# Patient Record
Sex: Female | Born: 1970
Health system: Southern US, Community
[De-identification: ages and names within clinical notes are randomized; demographics above are authoritative.]

## PROBLEM LIST (undated history)

## (undated) DIAGNOSIS — G43909 Migraine, unspecified, not intractable, without status migrainosus: Secondary | ICD-10-CM

## (undated) DIAGNOSIS — J020 Streptococcal pharyngitis: Secondary | ICD-10-CM

## (undated) DIAGNOSIS — C801 Malignant (primary) neoplasm, unspecified: Secondary | ICD-10-CM

## (undated) DIAGNOSIS — I2699 Other pulmonary embolism without acute cor pulmonale: Secondary | ICD-10-CM

## (undated) DIAGNOSIS — K219 Gastro-esophageal reflux disease without esophagitis: Secondary | ICD-10-CM

## (undated) DIAGNOSIS — F419 Anxiety disorder, unspecified: Secondary | ICD-10-CM

## (undated) DIAGNOSIS — T7840XA Allergy, unspecified, initial encounter: Secondary | ICD-10-CM

## (undated) DIAGNOSIS — J189 Pneumonia, unspecified organism: Secondary | ICD-10-CM

## (undated) HISTORY — PX: SIMPLE MASTECTOMY: SHX2409

## (undated) HISTORY — DX: Anxiety disorder, unspecified: F41.9

## (undated) HISTORY — DX: Gastro-esophageal reflux disease without esophagitis: K21.9

## (undated) HISTORY — PX: WISDOM TOOTH EXTRACTION: SHX21

## (undated) HISTORY — PX: RECONSTRUCTION BREAST IMMEDIATE / DELAYED W/ TISSUE EXPANDER: SUR1077

## (undated) HISTORY — DX: Allergy, unspecified, initial encounter: T78.40XA

## (undated) HISTORY — DX: Migraine, unspecified, not intractable, without status migrainosus: G43.909

---

## 1998-03-26 ENCOUNTER — Encounter: Admission: RE | Admit: 1998-03-26 | Discharge: 1998-03-26 | Payer: Self-pay | Admitting: Family Medicine

## 1998-04-03 ENCOUNTER — Encounter: Admission: RE | Admit: 1998-04-03 | Discharge: 1998-04-03 | Payer: Self-pay | Admitting: Family Medicine

## 1998-04-19 ENCOUNTER — Encounter: Admission: RE | Admit: 1998-04-19 | Discharge: 1998-04-19 | Payer: Self-pay | Admitting: Family Medicine

## 1998-04-23 ENCOUNTER — Encounter: Admission: RE | Admit: 1998-04-23 | Discharge: 1998-04-23 | Payer: Self-pay | Admitting: Sports Medicine

## 1998-04-30 ENCOUNTER — Encounter: Admission: RE | Admit: 1998-04-30 | Discharge: 1998-04-30 | Payer: Self-pay | Admitting: Family Medicine

## 1998-06-05 ENCOUNTER — Encounter: Admission: RE | Admit: 1998-06-05 | Discharge: 1998-06-05 | Payer: Self-pay | Admitting: Family Medicine

## 1998-09-10 ENCOUNTER — Encounter: Admission: RE | Admit: 1998-09-10 | Discharge: 1998-09-10 | Payer: Self-pay | Admitting: Family Medicine

## 2000-01-12 ENCOUNTER — Other Ambulatory Visit: Admission: RE | Admit: 2000-01-12 | Discharge: 2000-01-12 | Payer: Self-pay | Admitting: Family Medicine

## 2001-12-28 ENCOUNTER — Other Ambulatory Visit: Admission: RE | Admit: 2001-12-28 | Discharge: 2001-12-28 | Payer: Self-pay | Admitting: Family Medicine

## 2003-05-28 ENCOUNTER — Other Ambulatory Visit: Admission: RE | Admit: 2003-05-28 | Discharge: 2003-05-28 | Payer: Self-pay | Admitting: Family Medicine

## 2005-04-13 ENCOUNTER — Other Ambulatory Visit: Admission: RE | Admit: 2005-04-13 | Discharge: 2005-04-13 | Payer: Self-pay | Admitting: Family Medicine

## 2006-06-16 ENCOUNTER — Other Ambulatory Visit: Admission: RE | Admit: 2006-06-16 | Discharge: 2006-06-16 | Payer: Self-pay | Admitting: Family Medicine

## 2006-08-04 ENCOUNTER — Encounter: Admission: RE | Admit: 2006-08-04 | Discharge: 2006-08-04 | Payer: Self-pay | Admitting: Otolaryngology

## 2008-05-29 ENCOUNTER — Other Ambulatory Visit: Admission: RE | Admit: 2008-05-29 | Discharge: 2008-05-29 | Payer: Self-pay | Admitting: Family Medicine

## 2012-07-14 ENCOUNTER — Other Ambulatory Visit (HOSPITAL_COMMUNITY)
Admission: RE | Admit: 2012-07-14 | Discharge: 2012-07-14 | Disposition: A | Discharge status: Home / Self Care | Payer: BC Managed Care – PPO | Source: Ambulatory Visit | Attending: Family Medicine | Admitting: Family Medicine

## 2012-07-14 ENCOUNTER — Other Ambulatory Visit: Payer: Self-pay | Admitting: Family Medicine

## 2012-07-14 DIAGNOSIS — Z0000 Encounter for general adult medical examination without abnormal findings: Secondary | ICD-10-CM | POA: Insufficient documentation

## 2012-07-15 ENCOUNTER — Telehealth: Payer: Self-pay | Admitting: Genetic Counselor

## 2012-07-15 NOTE — Telephone Encounter (Signed)
S/W PT IN REF TO NP GENETIC  APPT. 09/22/12@10 :00 REFERRING DR WHITE MAILED NP PACKET

## 2012-07-26 ENCOUNTER — Other Ambulatory Visit: Payer: Self-pay | Admitting: Family Medicine

## 2012-07-26 DIAGNOSIS — M25512 Pain in left shoulder: Secondary | ICD-10-CM

## 2012-07-29 ENCOUNTER — Ambulatory Visit
Admission: RE | Admit: 2012-07-29 | Discharge: 2012-07-29 | Disposition: A | Discharge status: Home / Self Care | Payer: BC Managed Care – PPO | Source: Ambulatory Visit | Attending: Family Medicine | Admitting: Family Medicine

## 2012-07-29 DIAGNOSIS — M25512 Pain in left shoulder: Secondary | ICD-10-CM

## 2012-09-22 ENCOUNTER — Encounter: Payer: Self-pay | Admitting: Genetic Counselor

## 2012-09-22 ENCOUNTER — Other Ambulatory Visit: Payer: Self-pay | Admitting: Lab

## 2013-07-20 ENCOUNTER — Telehealth: Payer: Self-pay | Admitting: Genetic Counselor

## 2013-07-20 NOTE — Telephone Encounter (Signed)
LEFT MESSAGE FOP PATIENT TO RETURN CALL TO SCHEDULE GENETIC APPT.

## 2013-07-25 ENCOUNTER — Telehealth: Payer: Self-pay | Admitting: Genetic Counselor

## 2013-07-25 NOTE — Telephone Encounter (Signed)
S/W PATIENT AND GAVE GENETIC APPT FOR 05/28 @ 1 W/GENETIC COUSELOR.  [place] [facility] PACK[facility] MAILED.

## 2013-09-01 ENCOUNTER — Telehealth: Payer: Self-pay | Admitting: *Deleted

## 2013-09-01 NOTE — Telephone Encounter (Signed)
Left message for pt to return my call so I can reschedule her genetic appt. 

## 2013-09-08 ENCOUNTER — Telehealth: Payer: Self-pay | Admitting: *Deleted

## 2013-09-08 NOTE — Telephone Encounter (Signed)
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

## 2013-09-28 ENCOUNTER — Encounter: Payer: BC Managed Care – PPO | Admitting: Genetic Counselor

## 2013-09-28 ENCOUNTER — Other Ambulatory Visit: Payer: BC Managed Care – PPO

## 2014-11-06 ENCOUNTER — Encounter: Payer: Self-pay | Admitting: Family

## 2014-11-06 ENCOUNTER — Encounter (INDEPENDENT_AMBULATORY_CARE_PROVIDER_SITE_OTHER): Payer: Self-pay

## 2014-11-06 ENCOUNTER — Ambulatory Visit (INDEPENDENT_AMBULATORY_CARE_PROVIDER_SITE_OTHER): Payer: 59 | Admitting: Family

## 2014-11-06 ENCOUNTER — Other Ambulatory Visit (INDEPENDENT_AMBULATORY_CARE_PROVIDER_SITE_OTHER): Payer: 59

## 2014-11-06 ENCOUNTER — Telehealth: Payer: Self-pay | Admitting: Family

## 2014-11-06 VITALS — BP 102/78 | HR 65 | Temp 98.0°F | Resp 18 | Ht 63.0 in | Wt 145.0 lb

## 2014-11-06 DIAGNOSIS — M542 Cervicalgia: Secondary | ICD-10-CM

## 2014-11-06 DIAGNOSIS — R5383 Other fatigue: Secondary | ICD-10-CM | POA: Insufficient documentation

## 2014-11-06 LAB — CBC
HCT: 41.3 % (ref 36.0–46.0)
Hemoglobin: 13.5 g/dL (ref 12.0–15.0)
MCHC: 32.7 g/dL (ref 30.0–36.0)
MCV: 88.3 fl (ref 78.0–100.0)
PLATELETS: 296 10*3/uL (ref 150.0–400.0)
RBC: 4.68 Mil/uL (ref 3.87–5.11)
RDW: 12.8 % (ref 11.5–15.5)
WBC: 5.7 10*3/uL (ref 4.0–10.5)

## 2014-11-06 LAB — TSH: TSH: 1.64 u[IU]/mL (ref 0.35–4.50)

## 2014-11-06 LAB — IBC PANEL
Iron: 95 ug/dL (ref 42–145)
SATURATION RATIOS: 30.3 % (ref 20.0–50.0)
Transferrin: 224 mg/dL (ref 212.0–360.0)

## 2014-11-06 LAB — B12 AND FOLATE PANEL
FOLATE: 22.3 ng/mL (ref >5.9)
VITAMIN B 12: 742 pg/mL (ref 211–911)

## 2014-11-06 NOTE — Progress Notes (Signed)
Pre visit review using our clinic review tool, if applicable. No additional management support is needed unless otherwise documented below in the visit note. 

## 2014-11-06 NOTE — Assessment & Plan Note (Signed)
Neck exam is mostly benign. Treat conservatively at this time with current regimen of Zanaflex and naproxen as needed. Increase treatment with heat and stretching multiple times per day. Follow-up if symptoms worsen or fail to improve.

## 2014-11-06 NOTE — Assessment & Plan Note (Signed)
Fatigue of undetermined source. Obtain B12 and folate, CBC, IVC panel, TSH, and Lyme disease to rule out metabolic causes. Cannot rule out underlying cardiovascular disease, anxiety, or depression. Follow up pending lab work. Continue current medications.

## 2014-11-06 NOTE — Telephone Encounter (Signed)
Please inform the patient that her lab work from today indicates no metabolic cause of her fatigue as her B12/Folate, iron, thyroid function, and white/red blood cells are normal. We are still awaiting her lyme disease test. As we discussed, we cannot rule out cardiovascular disease, depression or her anxiety as the causes. As for her neck we can plan to follow up in about a month to determine any improvements, and if not consider further imaging or referral if needed.

## 2014-11-06 NOTE — Patient Instructions (Addendum)
Thank you for choosing [facility].  Summary/Instructions:  For your neck, please continue to take the naproxen and zanaflex as needed. Also add heat and stretching. May also consider icy/hot or related products.   Continue to take your medications as prescribed.   If your symptoms worsen or fail to improve, please contact our office for further instruction, or in case of emergency go directly to the emergency room at the closest medical facility.    Cervical  Exercises with Rehab Cervical strain and sprain are injuries that commonly occur with "whiplash" injuries. Whiplash occurs when the neck is forcefully whipped backward or forward, such as during a motor vehicle accident or during contact sports. The muscles, ligaments, tendons, discs, and nerves of the neck are susceptible to injury when this occurs. RISK FACTORS Risk of having a whiplash injury increases if:  Osteoarthritis of the spine.  Situations that make head or neck accidents or trauma more likely.  High-risk sports (football, rugby, wrestling, hockey, auto racing, gymnastics, diving, contact karate, or boxing).  Poor strength and flexibility of the neck.  Previous neck injury.  Poor tackling technique.  Improperly fitted or padded equipment. SYMPTOMS   Pain or stiffness in the front or back of neck or both.  Symptoms may present immediately or up to 24 hours after injury.  Dizziness, headache, nausea, and vomiting.  Muscle spasm with soreness and stiffness in the neck.  Tenderness and swelling at the injury site. PREVENTION  Learn and use proper technique (avoid tackling with the head, spearing, and head-butting; use proper falling techniques to avoid landing on the head).  Warm up and stretch properly before activity.  Maintain physical fitness:  Strength, flexibility, and endurance.  Cardiovascular fitness.  Wear properly fitted and padded protective equipment, such as padded soft collars,  for participation in contact sports. PROGNOSIS  Recovery from cervical strain and sprain injuries is dependent on the extent of the injury. These injuries are usually curable in 1 week to 3 months with appropriate treatment.  RELATED COMPLICATIONS   Temporary numbness and weakness may occur if the nerve roots are damaged, and this may persist until the nerve has completely healed.  Chronic pain due to frequent recurrence of symptoms.  Prolonged healing, especially if activity is resumed too soon (before complete recovery). TREATMENT  Treatment initially involves the use of ice and medication to help reduce pain and inflammation. It is also important to perform strengthening and stretching exercises and modify activities that worsen symptoms so the injury does not get worse. These exercises may be performed at home or with a therapist. For patients who experience severe symptoms, a soft, padded collar may be recommended to be worn around the neck.  Improving your posture may help reduce symptoms. Posture improvement includes pulling your chin and abdomen in while sitting or standing. If you are sitting, sit in a firm chair with your buttocks against the back of the chair. While sleeping, try replacing your pillow with a small towel rolled to 2 inches in diameter, or use a cervical pillow or soft cervical collar. Poor sleeping positions delay healing.  For patients with nerve root damage, which causes numbness or weakness, the use of a cervical traction apparatus may be recommended. Surgery is rarely necessary for these injuries. However, cervical strain and sprains that are present at birth (congenital) may require surgery. MEDICATION   If pain medication is necessary, nonsteroidal anti-inflammatory medications, such as aspirin and ibuprofen, or other minor pain relievers, such as acetaminophen,  are often recommended.  Do not take pain medication for 7 days before surgery.  Prescription pain  relievers may be given if deemed necessary by your caregiver. Use only as directed and only as much as you need. HEAT AND COLD:   Cold treatment (icing) relieves pain and reduces inflammation. Cold treatment should be applied for 10 to 15 minutes every 2 to 3 hours for inflammation and pain and immediately after any activity that aggravates your symptoms. Use ice packs or an ice massage.  Heat treatment may be used prior to performing the stretching and strengthening activities prescribed by your caregiver, physical therapist, or athletic trainer. Use a heat pack or a warm soak. SEEK MEDICAL CARE IF:   Symptoms get worse or do not improve in 2 weeks despite treatment.  New, unexplained symptoms develop (drugs used in treatment may produce side effects). EXERCISES RANGE OF MOTION (ROM) AND STRETCHING EXERCISES - Cervical Strain and Sprain These exercises may help you when beginning to rehabilitate your injury. In order to successfully resolve your symptoms, you must improve your posture. These exercises are designed to help reduce the forward-head and rounded-shoulder posture which contributes to this condition. Your symptoms may resolve with or without further involvement from your physician, physical therapist or athletic trainer. While completing these exercises, remember:   Restoring tissue flexibility helps normal motion to return to the joints. This allows healthier, less painful movement and activity.  An effective stretch should be held for at least 20 seconds, although you may need to begin with shorter hold times for comfort.  A stretch should never be painful. You should only feel a gentle lengthening or release in the stretched tissue. STRETCH- Axial Extensors  Lie on your back on the floor. You may bend your knees for comfort. Place a rolled-up hand towel or dish towel, about 2 inches in diameter, under the part of your head that makes contact with the floor.  Gently tuck your  chin, as if trying to make a "double chin," until you feel a gentle stretch at the base of your head.  Hold __________ seconds. Repeat __________ times. Complete this exercise __________ times per day.  STRETCH - Axial Extension   Stand or sit on a firm surface. Assume a good posture: chest up, shoulders drawn back, abdominal muscles slightly tense, knees unlocked (if standing) and feet hip width apart.  Slowly retract your chin so your head slides back and your chin slightly lowers. Continue to look straight ahead.  You should feel a gentle stretch in the back of your head. Be certain not to feel an aggressive stretch since this can cause headaches later.  Hold for __________ seconds. Repeat __________ times. Complete this exercise __________ times per day. STRETCH - Cervical Side Bend   Stand or sit on a firm surface. Assume a good posture: chest up, shoulders drawn back, abdominal muscles slightly tense, knees unlocked (if standing) and feet hip width apart.  Without letting your nose or shoulders move, slowly tip your right / left ear to your shoulder until your feel a gentle stretch in the muscles on the opposite side of your neck.  Hold __________ seconds. Repeat __________ times. Complete this exercise __________ times per day. STRETCH - Cervical Rotators   Stand or sit on a firm surface. Assume a good posture: chest up, shoulders drawn back, abdominal muscles slightly tense, knees unlocked (if standing) and feet hip width apart.  Keeping your eyes level with the ground, slowly turn your  head until you feel a gentle stretch along the back and opposite side of your neck.  Hold __________ seconds. Repeat __________ times. Complete this exercise __________ times per day. RANGE OF MOTION - Neck Circles   Stand or sit on a firm surface. Assume a good posture: chest up, shoulders drawn back, abdominal muscles slightly tense, knees unlocked (if standing) and feet hip width  apart.  Gently roll your head down and around from the back of one shoulder to the back of the other. The motion should never be forced or painful.  Repeat the motion 10-20 times, or until you feel the neck muscles relax and loosen. Repeat __________ times. Complete the exercise __________ times per day. STRENGTHENING EXERCISES - Cervical Strain and Sprain These exercises may help you when beginning to rehabilitate your injury. They may resolve your symptoms with or without further involvement from your physician, physical therapist, or athletic trainer. While completing these exercises, remember:   Muscles can gain both the endurance and the strength needed for everyday activities through controlled exercises.  Complete these exercises as instructed by your physician, physical therapist, or athletic trainer. Progress the resistance and repetitions only as guided.  You may experience muscle soreness or fatigue, but the pain or discomfort you are trying to eliminate should never worsen during these exercises. If this pain does worsen, stop and make certain you are following the directions exactly. If the pain is still present after adjustments, discontinue the exercise until you can discuss the trouble with your clinician. STRENGTH - Cervical Flexors, Isometric  Face a wall, standing about 6 inches away. Place a small pillow, a ball about 6-8 inches in diameter, or a folded towel between your forehead and the wall.  Slightly tuck your chin and gently push your forehead into the soft object. Push only with mild to moderate intensity, building up tension gradually. Keep your jaw and forehead relaxed.  Hold 10 to 20 seconds. Keep your breathing relaxed.  Release the tension slowly. Relax your neck muscles completely before you start the next repetition. Repeat __________ times. Complete this exercise __________ times per day. STRENGTH- Cervical Lateral Flexors, Isometric   Stand about 6 inches  away from a wall. Place a small pillow, a ball about 6-8 inches in diameter, or a folded towel between the side of your head and the wall.  Slightly tuck your chin and gently tilt your head into the soft object. Push only with mild to moderate intensity, building up tension gradually. Keep your jaw and forehead relaxed.  Hold 10 to 20 seconds. Keep your breathing relaxed.  Release the tension slowly. Relax your neck muscles completely before you start the next repetition. Repeat __________ times. Complete this exercise __________ times per day. STRENGTH - Cervical Extensors, Isometric   Stand about 6 inches away from a wall. Place a small pillow, a ball about 6-8 inches in diameter, or a folded towel between the back of your head and the wall.  Slightly tuck your chin and gently tilt your head back into the soft object. Push only with mild to moderate intensity, building up tension gradually. Keep your jaw and forehead relaxed.  Hold 10 to 20 seconds. Keep your breathing relaxed.  Release the tension slowly. Relax your neck muscles completely before you start the next repetition. Repeat __________ times. Complete this exercise __________ times per day. POSTURE AND BODY MECHANICS CONSIDERATIONS - Cervical Strain and Sprain Keeping correct posture when sitting, standing or completing your activities will reduce  the stress put on different body tissues, allowing injured tissues a chance to heal and limiting painful experiences. The following are general guidelines for improved posture. Your physician or physical therapist will provide you with any instructions specific to your needs. While reading these guidelines, remember:  The exercises prescribed by your provider will help you have the flexibility and strength to maintain correct postures.  The correct posture provides the optimal environment for your joints to work. All of your joints have less wear and tear when properly supported by a  spine with good posture. This means you will experience a healthier, less painful body.  Correct posture must be practiced with all of your activities, especially prolonged sitting and standing. Correct posture is as important when doing repetitive low-stress activities (typing) as it is when doing a single heavy-load activity (lifting). PROLONGED STANDING WHILE SLIGHTLY LEANING FORWARD When completing a task that requires you to lean forward while standing in one place for a long time, place either foot up on a stationary 2- to 4-inch high object to help maintain the best posture. When both feet are on the ground, the low back tends to lose its slight inward curve. If this curve flattens (or becomes too large), then the back and your other joints will experience too much stress, fatigue more quickly, and can cause pain.  RESTING POSITIONS Consider which positions are most painful for you when choosing a resting position. If you have pain with flexion-based activities (sitting, bending, stooping, squatting), choose a position that allows you to rest in a less flexed posture. You would want to avoid curling into a fetal position on your side. If your pain worsens with extension-based activities (prolonged standing, working overhead), avoid resting in an extended position such as sleeping on your stomach. Most people will find more comfort when they rest with their spine in a more neutral position, neither too rounded nor too arched. Lying on a non-sagging bed on your side with a pillow between your knees, or on your back with a pillow under your knees will often provide some relief. Keep in mind, being in any one position for a prolonged period of time, no matter how correct your posture, can still lead to stiffness. WALKING Walk with an upright posture. Your ears, shoulders, and hips should all line up. OFFICE WORK When working at a desk, create an environment that supports good, upright posture. Without  extra support, muscles fatigue and lead to excessive strain on joints and other tissues. CHAIR:  A chair should be able to slide under your desk when your back makes contact with the back of the chair. This allows you to work closely.  The chair's height should allow your eyes to be level with the upper part of your monitor and your hands to be slightly lower than your elbows.  Body position:  Your feet should make contact with the floor. If this is not possible, use a foot rest.  Keep your ears over your shoulders. This will reduce stress on your neck and low back. Document Released: 04/20/2005 Document Revised: 09/04/2013 Document Reviewed: 08/02/2008 [facility] Patient Information 2015 [place], [place]. This information is not intended to replace advice given to you by your health care provider. Make sure you discuss any questions you have with your health care provider.

## 2014-11-06 NOTE — Progress Notes (Signed)
Subjective:    Patient ID: Elizabeth Henry, female    DOB: [birth year], 44 y.o.   MRN: [id number]  Chief Complaint  Patient presents with  . Establish Care    having alot of bone pain, when waking up in the morning hip and shoulder pain but now is moving in neck, x3 years, lyme disease test    HPI:  BECKEY Henry is a 44 y.o. female with a PMH anxiety, GERD of  who presents today for an office visit to establish care.   1.) Achiness - Associated symptom of pain has been going on for about 3 years and is primarily located in her cervical spine. Negative for numbness or tingling. Timing of the pain is worse in the morning and late at night. Current modifying factors include zanaflex and naproxen which do help to control the pain. Severity of the pain is rated at a 5-6/10. Has also done needling and physical therapy.   2.) Fatigue - Associated symptom of fatigue has been going on for about several years. Notes it is exacerbated by her anxiety. She sleeps an average of 8-10 hours of sleep per night. Describes waking up several times throughout the night.   Allergies  Allergen Reactions  . Ambien [Zolpidem Tartrate] Swelling  . Tylox [Oxycodone-Acetaminophen] Hives    No outpatient prescriptions prior to visit.   No facility-administered medications prior to visit.     Past Medical History  Diagnosis Date  . Anxiety   . Allergy   . Migraines   . GERD (gastroesophageal reflux disease)      History reviewed. No pertinent past surgical history.   Family History  Problem Relation Age of Onset  . Breast cancer Mother   . Healthy Father      History   Social History  . Marital Status: Married    Spouse Name: N/A  . Number of Children: 2  . Years of Education: 16   Occupational History  . Self-employed    Social History Main Topics  . Smoking status: Former Research scientist (life sciences)  . Smokeless tobacco: Never Used  . Alcohol Use: No  . Drug Use: No  . Sexual Activity: Not  on file   Other Topics Concern  . Not on file   Social History Narrative   Fun: Go to [employer]; crochet   Denies religious beliefs effecting health care.     Review of Systems  Constitutional: Positive for fatigue. Negative for fever and chills.  Musculoskeletal: Positive for neck stiffness.  Psychiatric/Behavioral: The patient is nervous/anxious.       Objective:    BP 102/78 mmHg  Pulse 65  Temp(Src) 98 F (36.7 C) (Oral)  Resp 18  Ht 5\' 3"  (1.6 m)  Wt 145 lb (65.772 kg)  BMI 25.69 kg/m2  SpO2 97% Nursing note and vital signs reviewed.  Physical Exam  Constitutional: She is oriented to person, place, and time. She appears well-developed and well-nourished. No distress.  Cardiovascular: Normal rate, regular rhythm, normal heart sounds and intact distal pulses.   Pulmonary/Chest: Effort normal and breath sounds normal.  Musculoskeletal:  No obvious deformity, discoloration or edema of the cervical spine noted. Patient's range of motion actively and passively. Negative compression test.  Neurological: She is alert and oriented to person, place, and time.  Skin: Skin is warm and dry.  Psychiatric: She has a normal mood and affect. Her behavior is normal. Judgment and thought content normal.       Assessment &  Plan:   Problem List Items Addressed This Visit      Other   Neck pain - Primary    Neck exam is mostly benign. Treat conservatively at this time with current regimen of Zanaflex and naproxen as needed. Increase treatment with heat and stretching multiple times per day. Follow-up if symptoms worsen or fail to improve.      Relevant Medications   naproxen (NAPROSYN) 500 MG tablet   tiZANidine (ZANAFLEX) 4 MG capsule   Fatigue    Fatigue of undetermined source. Obtain B12 and folate, CBC, IVC panel, TSH, and Lyme disease to rule out metabolic causes. Cannot rule out underlying cardiovascular disease, anxiety, or depression. Follow up pending lab work.  Continue current medications.       Relevant Orders   B12 and Folate Panel   CBC   IBC panel   TSH   [facility] Antibodies

## 2014-11-07 LAB — B. BURGDORFI ANTIBODIES: B BURGDORFERI AB IGG+ IGM: 0.39 {ISR}

## 2014-11-07 NOTE — Telephone Encounter (Signed)
Please inform patient that her lyme disease test was also negative.

## 2014-11-07 NOTE — Telephone Encounter (Signed)
Pt wants you to be aware that she takes melatonin, hydroxyzine, lorazepam, and zanaflex all to help her sleep at night. She says her brain doesn't shut off and she thinks its due to anxiety. Muscle relaxer is for the pain that she has in her joints at night. She felt that it was important for you know and she forgot to mention it yesterday.

## 2014-11-13 ENCOUNTER — Telehealth: Payer: Self-pay | Admitting: Family

## 2014-11-13 MED ORDER — LORAZEPAM 1 MG PO TABS: 1.0000 mg | ORAL_TABLET | Freq: Every day | ORAL | Status: DC

## 2014-11-13 NOTE — Telephone Encounter (Signed)
Prescription printed and faxed.  

## 2014-11-13 NOTE — Telephone Encounter (Signed)
Is requesting script for lorazepam to be sent to [facility] on [address].

## 2014-11-14 NOTE — Telephone Encounter (Signed)
Patient states lorazepam script was incorrect.  Patient states she take 2 tablets a night and that script was for 1 tablet at night.

## 2014-11-15 MED ORDER — LORAZEPAM 1 MG PO TABS: 2.0000 mg | ORAL_TABLET | Freq: Every day | ORAL | Status: DC

## 2014-11-15 NOTE — Telephone Encounter (Signed)
LVM letting pt know.  

## 2014-11-15 NOTE — Telephone Encounter (Signed)
Medication was printed and faxed.

## 2015-01-16 ENCOUNTER — Other Ambulatory Visit: Payer: 59

## 2015-01-16 ENCOUNTER — Other Ambulatory Visit: Payer: Self-pay | Admitting: Family Medicine

## 2015-01-16 DIAGNOSIS — R140 Abdominal distension (gaseous): Secondary | ICD-10-CM

## 2015-01-24 ENCOUNTER — Encounter: Payer: Self-pay | Admitting: Family Medicine

## 2015-01-24 ENCOUNTER — Ambulatory Visit (INDEPENDENT_AMBULATORY_CARE_PROVIDER_SITE_OTHER): Payer: 59 | Admitting: Family Medicine

## 2015-01-24 VITALS — BP 118/80 | HR 79 | Resp 18 | Ht 63.0 in | Wt 156.0 lb

## 2015-01-24 DIAGNOSIS — F419 Anxiety disorder, unspecified: Secondary | ICD-10-CM | POA: Insufficient documentation

## 2015-01-24 DIAGNOSIS — N939 Abnormal uterine and vaginal bleeding, unspecified: Secondary | ICD-10-CM

## 2015-01-24 DIAGNOSIS — R140 Abdominal distension (gaseous): Secondary | ICD-10-CM | POA: Diagnosis not present

## 2015-01-24 DIAGNOSIS — K219 Gastro-esophageal reflux disease without esophagitis: Secondary | ICD-10-CM | POA: Insufficient documentation

## 2015-01-24 DIAGNOSIS — G43909 Migraine, unspecified, not intractable, without status migrainosus: Secondary | ICD-10-CM | POA: Insufficient documentation

## 2015-01-24 DIAGNOSIS — J309 Allergic rhinitis, unspecified: Secondary | ICD-10-CM | POA: Insufficient documentation

## 2015-01-24 NOTE — Progress Notes (Signed)
    Subjective:    Patient ID: Elizabeth Henry is a 44 y.o. female presenting with Metrorrhagia  on 01/24/2015  HPI: Notes increasing abdominal girth x 6 wks. Continues to get bigger. Worse in the evenings.  Worse after eating and going to the bathroom. Using GasX. Improves with rest. Reports regular cycles, then was 2 wks late and then 2 wks later has been bleeding x 2 wks with passage of clots.  PMH, PSH, meds, Allergies, FH and SH and OB history reviewed and updated.  Review of Systems  Constitutional: Positive for unexpected weight change (10 lb weight gain). Negative for fever and chills.  Respiratory: Negative for shortness of breath.   Cardiovascular: Negative for chest pain.  Gastrointestinal: Positive for abdominal distention. Negative for nausea, vomiting, diarrhea and constipation.  Genitourinary: Positive for vaginal bleeding and menstrual problem. Negative for dysuria and pelvic pain.  Musculoskeletal: Negative for back pain.  Skin: Negative for rash.      Objective:    BP 118/80 mmHg  Pulse 79  Resp 18  Ht 5\' 3"  (1.6 m)  Wt 156 lb (70.761 kg)  BMI 27.64 kg/m2  LMP 12/17/2014 (Approximate) Physical Exam  Constitutional: She appears well-developed and well-nourished. No distress.  HENT:  Head: Normocephalic and atraumatic.  Eyes: No scleral icterus.  Neck: Neck supple.  Cardiovascular: Normal rate and regular rhythm.   No murmur heard. Pulmonary/Chest: Effort normal. No respiratory distress.  Abdominal:  Protuberant abdomen, no true shifting dullness is appreciated  Genitourinary:  BUS normal, vagina is pink and rugated, cervix is nulliparous without lesion, uterus feels small but due to protuberant abdomen was difficult to appreciate. There is the ? Of an adnexal mass that might be quite large noted.   Lymphadenopathy:    She has no cervical adenopathy.        Assessment & Plan:   Problem List Items Addressed This Visit      Unprioritized   Abnormal uterine bleeding - Primary    Will check FSH and TSH and pelvic sono--needs pap and EMB at next visit      Relevant Orders   TSH   Follicle stimulating hormone   US Pelvis Complete   US Transvaginal Non-OB   Bloating    Check her ovaries and be sure there is no question of ovarian mass      Relevant Orders   US Pelvis Complete   US Transvaginal Non-OB   GERD (gastroesophageal reflux disease)      Total face-to-face time with patient: 30 minutes. Over 50% of encounter was spent on counseling and coordination of care. Return in about 4 weeks (around 02/21/2015) for a follow-up.  PRATT,TANYA S 01/24/2015 10:46 AM

## 2015-01-24 NOTE — Patient Instructions (Signed)
Bloating Bloating is the feeling of fullness in your belly. You may feel as though your pants are too tight. Often the cause of bloating is overeating, retaining fluids, or having gas in your bowel. It is also caused by swallowing air and eating foods that cause gas. Irritable bowel syndrome is one of the most common causes of bloating. Constipation is also a common cause. Sometimes more serious problems can cause bloating. SYMPTOMS  Usually there is a feeling of fullness, as though your abdomen is bulged out. There may be mild discomfort.  DIAGNOSIS  Usually no particular testing is necessary for most bloating. If the condition persists and seems to become worse, your caregiver may do additional testing.  TREATMENT   There is no direct treatment for bloating.  Do not put gas into the bowel. Avoid chewing gum and sucking on candy. These tend to make you swallow air. Swallowing air can also be a nervous habit. Try to avoid this.  Avoiding high residue diets will help. Eat foods with soluble fibers (examples include root vegetables, apples, or barley) and substitute dairy products with soy and rice products. This helps irritable bowel syndrome.  If constipation is the cause, then a high residue diet with more fiber will help.  Avoid carbonated beverages.  Over-the-counter preparations are available that help reduce gas. Your pharmacist can help you with this. SEEK MEDICAL CARE IF:   Bloating continues and seems to be getting worse.  You notice a weight gain.  You have a weight loss but the bloating is getting worse.  You have changes in your bowel habits or develop nausea or vomiting. SEEK IMMEDIATE MEDICAL CARE IF:   You develop shortness of breath or swelling in your legs.  You have an increase in abdominal pain or develop chest pain. Document Released: 02/18/2006 Document Revised: 07/13/2011 Document Reviewed: 04/08/2007 ExitCare Patient Information 2015 [employer]. This  information is not intended to replace advice given to you by your health care provider. Make sure you discuss any questions you have with your health care provider.  

## 2015-01-24 NOTE — Assessment & Plan Note (Signed)
Will check FSH and TSH and pelvic sono--needs pap and EMB at next visit

## 2015-01-24 NOTE — Assessment & Plan Note (Signed)
Check her ovaries and be sure there is no question of ovarian mass

## 2015-01-25 ENCOUNTER — Ambulatory Visit
Admission: RE | Admit: 2015-01-25 | Discharge: 2015-01-25 | Disposition: A | Discharge status: Home / Self Care | Payer: 59 | Source: Ambulatory Visit | Attending: Family Medicine | Admitting: Family Medicine

## 2015-01-25 DIAGNOSIS — R188 Other ascites: Secondary | ICD-10-CM | POA: Insufficient documentation

## 2015-01-25 DIAGNOSIS — N939 Abnormal uterine and vaginal bleeding, unspecified: Secondary | ICD-10-CM

## 2015-01-25 DIAGNOSIS — R1900 Intra-abdominal and pelvic swelling, mass and lump, unspecified site: Secondary | ICD-10-CM | POA: Diagnosis not present

## 2015-01-25 DIAGNOSIS — R140 Abdominal distension (gaseous): Secondary | ICD-10-CM | POA: Insufficient documentation

## 2015-01-25 LAB — TSH: TSH: 2.92 u[IU]/mL (ref 0.350–4.500)

## 2015-01-25 LAB — FOLLICLE STIMULATING HORMONE: FSH: 7.2 m[IU]/mL

## 2015-01-28 ENCOUNTER — Other Ambulatory Visit (INDEPENDENT_AMBULATORY_CARE_PROVIDER_SITE_OTHER): Payer: 59 | Admitting: *Deleted

## 2015-01-28 ENCOUNTER — Ambulatory Visit (HOSPITAL_COMMUNITY): Payer: 59

## 2015-01-28 ENCOUNTER — Telehealth: Payer: Self-pay | Admitting: Family Medicine

## 2015-01-28 DIAGNOSIS — N839 Noninflammatory disorder of ovary, fallopian tube and broad ligament, unspecified: Secondary | ICD-10-CM | POA: Diagnosis not present

## 2015-01-28 DIAGNOSIS — N838 Other noninflammatory disorders of ovary, fallopian tube and broad ligament: Secondary | ICD-10-CM

## 2015-01-28 DIAGNOSIS — R188 Other ascites: Secondary | ICD-10-CM

## 2015-01-28 NOTE — Telephone Encounter (Signed)
Called patient to discuss results with her. U/s is concerning for ovarian malignancy with bilateral large ovarian masses and ascites.  Will check CA-125 at the [facility] office prior to referral.  Scheduled appointment with GYN/ONC for 01/31/15 at 9 am.  She is to arrive at the cancer center at 8:45. All questions answered.

## 2015-01-29 LAB — CA 125: CA 125: 3142 U/mL — AB (ref <35)

## 2015-01-30 ENCOUNTER — Telehealth: Payer: Self-pay | Admitting: *Deleted

## 2015-01-30 ENCOUNTER — Ambulatory Visit (HOSPITAL_COMMUNITY): Payer: 59

## 2015-01-30 NOTE — Telephone Encounter (Signed)
Requesting result of CA 125, informed pt of abnormal result, will follow-up with Oncologists tomorrow for further evaluation.

## 2015-01-31 ENCOUNTER — Other Ambulatory Visit: Payer: Self-pay | Admitting: Gynecologic Oncology

## 2015-01-31 ENCOUNTER — Ambulatory Visit (HOSPITAL_COMMUNITY): Payer: 59

## 2015-01-31 ENCOUNTER — Encounter: Payer: Self-pay | Admitting: Gynecologic Oncology

## 2015-01-31 ENCOUNTER — Encounter (HOSPITAL_COMMUNITY): Payer: Self-pay

## 2015-01-31 ENCOUNTER — Ambulatory Visit (HOSPITAL_COMMUNITY)
Admission: RE | Admit: 2015-01-31 | Discharge: 2015-01-31 | Disposition: A | Discharge status: Home / Self Care | Payer: 59 | Source: Ambulatory Visit | Attending: Gynecologic Oncology | Admitting: Gynecologic Oncology

## 2015-01-31 ENCOUNTER — Ambulatory Visit (HOSPITAL_BASED_OUTPATIENT_CLINIC_OR_DEPARTMENT_OTHER): Payer: 59 | Admitting: Gynecologic Oncology

## 2015-01-31 VITALS — BP 121/79 | HR 80 | Temp 98.3°F | Resp 18 | Ht 63.0 in | Wt 159.7 lb

## 2015-01-31 DIAGNOSIS — Z803 Family history of malignant neoplasm of breast: Secondary | ICD-10-CM | POA: Insufficient documentation

## 2015-01-31 DIAGNOSIS — R188 Other ascites: Secondary | ICD-10-CM | POA: Diagnosis not present

## 2015-01-31 DIAGNOSIS — R971 Elevated cancer antigen 125 [CA 125]: Secondary | ICD-10-CM

## 2015-01-31 DIAGNOSIS — I2699 Other pulmonary embolism without acute cor pulmonale: Secondary | ICD-10-CM | POA: Diagnosis not present

## 2015-01-31 DIAGNOSIS — R590 Localized enlarged lymph nodes: Secondary | ICD-10-CM | POA: Diagnosis not present

## 2015-01-31 DIAGNOSIS — J90 Pleural effusion, not elsewhere classified: Secondary | ICD-10-CM | POA: Diagnosis not present

## 2015-01-31 DIAGNOSIS — N839 Noninflammatory disorder of ovary, fallopian tube and broad ligament, unspecified: Secondary | ICD-10-CM

## 2015-01-31 DIAGNOSIS — Z87891 Personal history of nicotine dependence: Secondary | ICD-10-CM | POA: Insufficient documentation

## 2015-01-31 DIAGNOSIS — K219 Gastro-esophageal reflux disease without esophagitis: Secondary | ICD-10-CM | POA: Diagnosis not present

## 2015-01-31 DIAGNOSIS — F419 Anxiety disorder, unspecified: Secondary | ICD-10-CM | POA: Diagnosis not present

## 2015-01-31 DIAGNOSIS — N939 Abnormal uterine and vaginal bleeding, unspecified: Secondary | ICD-10-CM | POA: Diagnosis not present

## 2015-01-31 DIAGNOSIS — Z79899 Other long term (current) drug therapy: Secondary | ICD-10-CM | POA: Insufficient documentation

## 2015-01-31 DIAGNOSIS — N838 Other noninflammatory disorders of ovary, fallopian tube and broad ligament: Secondary | ICD-10-CM

## 2015-01-31 MED ORDER — ENOXAPARIN SODIUM 80 MG/0.8ML ~~LOC~~ SOLN
70.0000 mg | Freq: Once | SUBCUTANEOUS | Status: AC
  Administered 2015-01-31: 70 mg via SUBCUTANEOUS

## 2015-01-31 MED ORDER — IOHEXOL 300 MG/ML  SOLN
50.0000 mL | Freq: Once | INTRAMUSCULAR | Status: AC | PRN
  Administered 2015-01-31: 50 mL via ORAL

## 2015-01-31 MED ORDER — ENOXAPARIN SODIUM 80 MG/0.8ML ~~LOC~~ SOLN: 1.0000 mg/kg | Freq: Two times a day (BID) | SUBCUTANEOUS | Status: DC

## 2015-01-31 MED ORDER — IOHEXOL 300 MG/ML  SOLN
100.0000 mL | Freq: Once | INTRAMUSCULAR | Status: AC | PRN
  Administered 2015-01-31: 100 mL via INTRAVENOUS

## 2015-01-31 NOTE — Progress Notes (Signed)
Consult Note: Gyn-Onc  Consult was requested by Dr. Kennon Rounds for the evaluation of Elizabeth Henry 44 y.o. female  CC:  Chief Complaint  Patient presents with  . ovarian masses    Assessment/Plan:  Elizabeth Henry  is a 44 y.o.  year old with bilateral ovarian cystic and solid masses, large volume ascites, abnormal uterine bleeding and an elevated CA 125 (3142U/mL).  I discussed with Elizabeth Henry that I have a high suspicion that she has an advanced stage gynecologic malignancy. I favor an ovarian primary based on her ascites and adnexal masses, however we will follow up the results of today's endometrial biopsy given her abnormal uterine bleeding. She has a striking personal and family history for premenopausal breast and ovarian cancer which also makes an ovarian primary higher likelihood and necessitates genetic consultation and testing during the treatment of this malignancy.  I discussed with the patient that we need to obtain more data prior to proceeding with therapy. I have ordered a CT scan of the chest abdomen and pelvis to better define the extent of disease and determine if she has unresectable disease. At that point we will better understand if she is a candidate for either upfront cytoreductive procedure followed by adjuvant chemotherapy or neoadjuvant chemotherapy followed by an interval surgical cytoreductive effort. If there is no evidence of unresectable disease or disease not amenable to resection to R1 or R0 status, I would recommend upfront cytoreduction given this patient's young age, good medical comorbidity status (no major medical comorbidities), thin body habitus, and good clinical nutritional status. I discussed that neoadjuvant chemotherapy is associated with less surgical morbidity and decreased risk for bowel resection and colostomy, but that upfront surgery is typically recommended where medically feasible, and likely to be associated with optimal cytoreduction.  We  will schedule her for a therapeutic paracentesis given her symptoms of distension from ascites. If the imaging suggests that the majority of her distension is from ovarian cysts and not fluid, we will cancel this.  I briefly discussed the etiology and prognosis of ovarian cancer and the need for genetic testing.  We discussed that a surgical effort would require an ex lap, TAH, BSO, omentectomy and debulking. I discussed surgical risks associated with this including  bleeding, infection, damage to internal organs (such as bladder,ureters, bowels), blood clot, reoperation and rehospitalization. I discussed that in order to optimize the timing of this (expedite) I will have her seen by my partner (Dr Colonel Bald) for surgery next week in [place].    HPI: Elizabeth Henry is a very pleasant 44 year old G2P2 who is seen in consultation at the request of Dr Kennon Rounds for bilateral    Interval History: Ovarian masses, ascites, elevated CA-125, and abnormal uterine bleeding. The patient began experiencing abdominal bloating and early satiety 6 weeks ago in July 2016. She since the end of August 2016 she has experienced vaginal bleeding every day that is light. She denies pain other than stretching pain in her abdomen. She's had some constipation but no change in the caliber of his stools and no blood in her bowel movements.  She was evaluated by Dr. Kennon Rounds on 01/25/2015. A pelvic and abdominal ultrasound scan were ordered and revealed large volume of ascites in the abdomen, bilateral ovarian masses with the right measuring 24.3 x 13.1 x 25.3 cm with solid and cystic components and blood flow within the solid components. The left ovary measured 14.5 x 12.7 x 14.8 cm and containing solid cystic  components with increased blood flow. The endometrial stripe was slightly thickened at 9 mm (cc patient is premenopausal). A CA-125 was drawn on 01/28/2015 and was elevated at 3142 units per milliliter.  The patient is  otherwise a very healthy woman. She has no chronic medical complaints other than anxiety. She has a significant family history for a mother who died from breast cancer at age 8, and a maternal grandmother who died from cancer of unknown source at a premenopausal age.   Elizabeth Henry has not had prior abdominal surgeries. She has history of 2 vaginal deliveries. She has a remote history of abnormal smears but Pap smears in recent years have all been normal.  Current Meds:  Outpatient Encounter Prescriptions as of 01/31/2015  Medication Sig  . Cholecalciferol (VITAMIN D) 2000 UNITS tablet Take 4,000 Units by mouth daily.  . hydrOXYzine (VISTARIL) 25 MG capsule Take 25 mg by mouth at bedtime.  Marland Kitchen LORazepam (ATIVAN) 1 MG tablet Take 2 tablets (2 mg total) by mouth at bedtime.  . Melatonin 2.5 MG CAPS Take 5 mg by mouth at bedtime.  . montelukast (SINGULAIR) 10 MG tablet Take 10 mg by mouth at bedtime.  . naproxen (NAPROSYN) 500 MG tablet Take 500 mg by mouth 2 (two) times daily with a meal.  . pantoprazole (PROTONIX) 40 MG tablet Take 40 mg by mouth daily.  . SUMAtriptan (IMITREX) 100 MG tablet Take 100 mg by mouth as needed for migraine. May repeat in 2 hours if headache persists or recurs.  Marland Kitchen tiZANidine (ZANAFLEX) 4 MG capsule Take 4 mg by mouth at bedtime.  Marland Kitchen venlafaxine (EFFEXOR) 100 MG tablet Take 100 mg by mouth 2 (two) times daily.   No facility-administered encounter medications on file as of 01/31/2015.    Allergy:  Allergies  Allergen Reactions  . Ambien [Zolpidem Tartrate] Swelling  . Tylox [Oxycodone-Acetaminophen] Hives    Social Hx:   Social History   Social History  . Marital Status: Married    Spouse Name: N/A  . Number of Children: 2  . Years of Education: 16   Occupational History  . Self-employed    Social History Main Topics  . Smoking status: Former Smoker    Quit date: 10/17/2012  . Smokeless tobacco: Never Used  . Alcohol Use: No  . Drug Use: No  . Sexual  Activity: Not Currently   Other Topics Concern  . Not on file   Social History Narrative   Fun: Go to [employer]; crochet   Denies religious beliefs effecting health care.     Past Surgical Hx: History reviewed. No pertinent past surgical history.  Past Medical Hx:  Past Medical History  Diagnosis Date  . Anxiety   . Allergy   . Migraines   . GERD (gastroesophageal reflux disease)     Past Gynecological History:  Remote hx of abn paps.  Patient's last menstrual period was 12/17/2014 (approximate).  Family Hx:  Family History  Problem Relation Age of Onset  . Breast cancer Mother 62  . Healthy Father   . Cancer Maternal Grandmother   . Cancer Paternal Grandmother   . HIV Brother   . Leukemia Paternal Uncle   . HIV Brother     Review of Systems:  Constitutional  Feels distended  ENT Normal appearing ears and nares bilaterally Skin/Breast  No rash, sores, jaundice, itching, dryness Cardiovascular  No chest pain, shortness of breath, or edema  Pulmonary  No cough or wheeze.  Gastro Intestinal  No nausea, vomitting, or diarrhoea. + constipation, + bloating  Genito Urinary  No frequency, urgency, dysuria, + vaginal bleeding Musculo Skeletal  No myalgia, arthralgia, joint swelling or pain  Neurologic  No weakness, numbness, change in gait,  Psychology  No depression, anxiety, insomnia.   Vitals:  Blood pressure 121/79, pulse 80, temperature 98.3 F (36.8 C), temperature source Oral, resp. rate 18, height 5\' 3"  (1.6 m), weight 159 lb 11.2 oz (72.439 kg), last menstrual period 12/17/2014, SpO2 98 %.  Physical Exam: WD in NAD Neck  Supple NROM, without any enlargements.  Lymph Node Survey No cervical supraclavicular or inguinal adenopathy Cardiovascular  Pulse normal rate, regularity and rhythm. S1 and S2 normal.  Lungs  Clear to auscultation bilateraly, without wheezes/crackles/rhonchi. Good air movement.  Skin  No rash/lesions/breakdown  Psychiatry   Alert and oriented to person, place, and time  Abdomen  Normoactive bowel sounds, abdomen, tense and distended, non-tender and thin without evidence of hernia.  Back No CVA tenderness Genito Urinary  Vulva/vagina: Normal external female genitalia.   No lesions. No discharge or bleeding.  Bladder/urethra:  No lesions or masses, well supported bladder  Vagina: normal  Cervix: Normal appearing, no lesions.  Uterus:  Small, mobile, no parametrial involvement or nodularity.  Adnexa: no discretely palpable masses in the pelvis, though fullness beyond is appreciated. Rectal  Good tone, no masses no cul de sac nodularity.  Extremities  No bilateral cyanosis, clubbing or edema.  PROCEDURE:  Endometrial Biopsy  The procedure was explained.  The speculum was placed and the cervix was visualized.  The endometrial biopsy was performed with 1 pass of the endometrial biopsy pipelle. Moderate tissue was obtained. The uterus sounded to 9 cm.  The patient tolerated procedure    Donaciano Eva, MD  01/31/2015, 10:21 AM    Imaging from today (CT of chest,abdo,pelvis) shows findings consistent with ovarian carcinoma. She has a moderate right pleural effusion and a left pulmonary artery PE. She also has mediastinal adenopathy. I am suspicious for stage IV disease. She is not a good candidate for upfront debulking due to the acute PE finding and stage IV disease. Instead we will cancel surgery, order therapeutic lovenox (1mg /kg BID).  Needs paracentesis and thoracentesis to acquire cytology to confirm cancer. Will schedule her to see Med Onc for neoadjuvant chemotherapy (carboplatin AUC 6 and paclitaxel 175mg /m2).  Donaciano Eva, MD

## 2015-01-31 NOTE — Patient Instructions (Addendum)
You are scheduled for surgery at [facility] with Dr. Suzan Slick on Wednesday, February 06, 2015. Your pre-op appointment at [facility] is scheduled for Friday, September 30 at 2pm at the [facility].  Please call our office with any questions or concerns you have.

## 2015-02-01 ENCOUNTER — Ambulatory Visit (HOSPITAL_COMMUNITY)
Admission: RE | Admit: 2015-02-01 | Discharge: 2015-02-01 | Disposition: A | Discharge status: Home / Self Care | Payer: 59 | Source: Ambulatory Visit | Attending: Radiology | Admitting: Radiology

## 2015-02-01 ENCOUNTER — Ambulatory Visit (HOSPITAL_COMMUNITY)
Admission: RE | Admit: 2015-02-01 | Discharge: 2015-02-01 | Disposition: A | Discharge status: Home / Self Care | Payer: 59 | Source: Ambulatory Visit | Attending: Gynecologic Oncology | Admitting: Gynecologic Oncology

## 2015-02-01 ENCOUNTER — Telehealth: Payer: Self-pay | Admitting: *Deleted

## 2015-02-01 DIAGNOSIS — R188 Other ascites: Secondary | ICD-10-CM | POA: Insufficient documentation

## 2015-02-01 DIAGNOSIS — N839 Noninflammatory disorder of ovary, fallopian tube and broad ligament, unspecified: Secondary | ICD-10-CM | POA: Insufficient documentation

## 2015-02-01 DIAGNOSIS — N838 Other noninflammatory disorders of ovary, fallopian tube and broad ligament: Secondary | ICD-10-CM

## 2015-02-01 DIAGNOSIS — R971 Elevated cancer antigen 125 [CA 125]: Secondary | ICD-10-CM

## 2015-02-01 DIAGNOSIS — J90 Pleural effusion, not elsewhere classified: Secondary | ICD-10-CM | POA: Insufficient documentation

## 2015-02-01 DIAGNOSIS — I2699 Other pulmonary embolism without acute cor pulmonale: Secondary | ICD-10-CM

## 2015-02-01 NOTE — Telephone Encounter (Signed)
Received fax confirmation from Elizabeth Henry that Lovenox has been approved. Called and notified patient of this and she is agreeable to pickup Lovenox injections at her pharmacy. Told patient that if she has any additional questions or concerns to please call our office back.  While on the phone, inquired about how patient is feeling after the procedure this morning. She states she is having a much easier time breathing. Denies any concerns at this time.

## 2015-02-01 NOTE — Procedures (Signed)
Successful US guided right thoracentesis. Yielded 814mL of clear, amber colored fluid. Pt tolerated procedure well. No immediate complications.  Specimen was sent for labs. CXR ordered.  Ascencion Dike PA-C 02/01/2015 10:43 AM

## 2015-02-01 NOTE — Procedures (Signed)
Successful US guided paracentesis from [facility].  Yielded 815m of hazy, amber colored fluid.  No immediate complications.  Pt tolerated well.   Specimen was sent for labs.  Ascencion Dike PA-C 02/01/2015 10:42 AM

## 2015-02-02 DIAGNOSIS — I2699 Other pulmonary embolism without acute cor pulmonale: Secondary | ICD-10-CM

## 2015-02-02 HISTORY — DX: Other pulmonary embolism without acute cor pulmonale: I26.99

## 2015-02-04 ENCOUNTER — Telehealth: Payer: Self-pay

## 2015-02-04 ENCOUNTER — Telehealth: Payer: Self-pay | Admitting: Gynecologic Oncology

## 2015-02-04 DIAGNOSIS — R1084 Generalized abdominal pain: Secondary | ICD-10-CM

## 2015-02-04 MED ORDER — HYDROCODONE-ACETAMINOPHEN 5-325 MG PO TABS: 1.0000 | ORAL_TABLET | Freq: Four times a day (QID) | ORAL | Status: DC | PRN

## 2015-02-04 NOTE — Telephone Encounter (Signed)
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 Benign endometrial biospy. Plan is for neoadjuvant chemotherapy with carbo/taxol. She is very uncomfortable with distension. I discussed I would prefer to hold off additional taps because the fluid will quickly reaccumulate and I would prefer to not take her off of anticoagulation so frequently in the light of a recent PE. Perscribing percocet for discomfort. Patient informed that this should be taken with great caution in a patient on anti-anxiety meds.  Donaciano Eva, MD

## 2015-02-04 NOTE — Telephone Encounter (Signed)
Follow up call placed to discuss concern over this past weekend, patient states "Saturday"  02/03/2015 was a "bad" day due to eating eggs and bacon to "fast" . Patient states she was not able to eat the remainder of the day with the exception of a few bites of food at dinner time. Patient states she continues to have that feeling of "fullness" and contacted the ED department Saturday and she states she was informed that maybe she would be able to get an order for a "pill" that would take care of the "fullness" she is experiencing. Patient was informed that unfortunately we do not have a pill for her "fullness" and paracentesis is not an option at this point due to her PE and recently being started on Lovenox. Patient states she is currently "feeling" better now and was able to eat without any "discomfort " related to eating "slower . Patient called 3 times today asking if she can have "chemotherapy" today or have another "paracentesis" again to drain the fluid off again . Patient was also informed that the fullness she has been experiencing is caused from the "bilateral ovarian cystic and solid masses" as discussed on Friday with Dr. Denman George, patient states understanding .  Patient was instructed to graze throughout the day vs eating larger portions of food and this should provide a decrease in the her feeling so full. Cyril Mourning, RN explained to patient in detail that she must meet with medical oncologist and then the chemotherapy must be approved by her insurance prior to starting.   28 - Patient's husband called inquiring about appointment for chemotherapy and potential paracentesis. Patient's husband placed on hold and Dr. Denman George spoke with both patient and husband in detail.

## 2015-02-05 ENCOUNTER — Telehealth: Payer: Self-pay | Admitting: *Deleted

## 2015-02-05 NOTE — Telephone Encounter (Signed)
Received phone call from patient this morning stating she is having increased pain and continued trouble tolerating eating due to abdominal distention. Inquired if patient had been to [facility] yet to pickup prescription - per pt, she is coming shortly to pickup script. Confirmed on the phone with patient that she is allergic to Tylox. Inquired if patient has ever taken Vicodin for pain - she states she is unsure. Told patient to make sure she has benadryl at home when she takes Vicodin for the first time as she had hives reaction with Tylox. Instructed patient that if she develops any shortness of breath, chest pain or other signs of severe allergic reaction to report to the nearest emergency room. Told patient to call our office back if she is unable to tolerate the Vicodin. Reminded patient to continue eating small meals throughout the day and that sleeping in a recliner or propt up on several pillows may be better tolerated as discussed previously with Dr. Denman George.  Maudie Mercury Nectar administrative assistant spoke with patient regarding appts for later this week at [facility]. Patient agreeable to appts as scheduled.

## 2015-02-06 ENCOUNTER — Telehealth: Payer: Self-pay | Admitting: *Deleted

## 2015-02-06 NOTE — Telephone Encounter (Signed)
Received phone call from patient inquiring if it is okay to take her constipation medication, Linzess, while taking PO pain medication. Educated patient that narcotic pain medication can cause increased constipation so she needs to be diligent about moving her bowels. She states she also takes Senna-S as needed. Patient agreeable to continue Linzess as prescribed and take Senna-S if needed.  Inquired if patient has taken the Vicodin 5-325 tablets prescribed yesterday - she states she took half a tablet yesterday afternoon and the other half at bedtime and tolerated medication without any problems. No other questions or concerns at this time. Patient agreeable to chemo class as scheduled for tomorrow and lab and Dr. Marko Plume this Friday.

## 2015-02-07 ENCOUNTER — Other Ambulatory Visit: Payer: 59

## 2015-02-07 ENCOUNTER — Telehealth: Payer: Self-pay

## 2015-02-07 ENCOUNTER — Encounter: Payer: Self-pay | Admitting: *Deleted

## 2015-02-07 DIAGNOSIS — N838 Other noninflammatory disorders of ovary, fallopian tube and broad ligament: Secondary | ICD-10-CM

## 2015-02-07 DIAGNOSIS — R110 Nausea: Secondary | ICD-10-CM

## 2015-02-07 MED ORDER — ONDANSETRON HCL 8 MG PO TABS: 8.0000 mg | ORAL_TABLET | Freq: Three times a day (TID) | ORAL | Status: DC | PRN

## 2015-02-07 NOTE — Telephone Encounter (Signed)
Writer received a call from Chemotherapy  Nurse with an update that the patient states she has been experiencing some nausea and is requesting intervention. Patient is scheduled for initial visit with Dr Evlyn Clines tomorrow in the [facility] , Friday Feb 08, 2015 . NP [name] updated with patient symptoms , verbal orders received for Zofran 8 MG PO every 8 hours PRN for nausea . Patient requesting to have prescription sent to [facility] on [address] , faxed as requested . Attempt to contact the patient to update , no answer , left a detailed message with call back number if additional questions or concerns arise.

## 2015-02-08 ENCOUNTER — Other Ambulatory Visit (HOSPITAL_BASED_OUTPATIENT_CLINIC_OR_DEPARTMENT_OTHER): Payer: 59

## 2015-02-08 ENCOUNTER — Encounter: Payer: Self-pay | Admitting: Oncology

## 2015-02-08 ENCOUNTER — Ambulatory Visit (HOSPITAL_BASED_OUTPATIENT_CLINIC_OR_DEPARTMENT_OTHER): Payer: 59 | Admitting: Oncology

## 2015-02-08 ENCOUNTER — Other Ambulatory Visit: Payer: Self-pay | Admitting: Oncology

## 2015-02-08 ENCOUNTER — Telehealth: Payer: Self-pay

## 2015-02-08 ENCOUNTER — Telehealth: Payer: Self-pay | Admitting: Oncology

## 2015-02-08 VITALS — BP 132/85 | HR 92 | Temp 97.9°F | Resp 18 | Ht 63.0 in | Wt 163.7 lb

## 2015-02-08 DIAGNOSIS — F17201 Nicotine dependence, unspecified, in remission: Secondary | ICD-10-CM | POA: Insufficient documentation

## 2015-02-08 DIAGNOSIS — I2699 Other pulmonary embolism without acute cor pulmonale: Secondary | ICD-10-CM | POA: Insufficient documentation

## 2015-02-08 DIAGNOSIS — N838 Other noninflammatory disorders of ovary, fallopian tube and broad ligament: Secondary | ICD-10-CM

## 2015-02-08 DIAGNOSIS — Z7901 Long term (current) use of anticoagulants: Secondary | ICD-10-CM

## 2015-02-08 DIAGNOSIS — C801 Malignant (primary) neoplasm, unspecified: Secondary | ICD-10-CM

## 2015-02-08 DIAGNOSIS — Z23 Encounter for immunization: Secondary | ICD-10-CM

## 2015-02-08 DIAGNOSIS — Z86711 Personal history of pulmonary embolism: Secondary | ICD-10-CM

## 2015-02-08 DIAGNOSIS — C569 Malignant neoplasm of unspecified ovary: Secondary | ICD-10-CM

## 2015-02-08 DIAGNOSIS — J910 Malignant pleural effusion: Secondary | ICD-10-CM | POA: Diagnosis not present

## 2015-02-08 DIAGNOSIS — N1330 Unspecified hydronephrosis: Secondary | ICD-10-CM

## 2015-02-08 DIAGNOSIS — R180 Malignant ascites: Secondary | ICD-10-CM | POA: Diagnosis not present

## 2015-02-08 DIAGNOSIS — G893 Neoplasm related pain (acute) (chronic): Secondary | ICD-10-CM | POA: Diagnosis not present

## 2015-02-08 LAB — COMPREHENSIVE METABOLIC PANEL (CC13)
ALBUMIN: 3 g/dL — AB (ref 3.5–5.0)
ALT: 44 U/L (ref 0–55)
ANION GAP: 8 meq/L (ref 3–11)
AST: 49 U/L — ABNORMAL HIGH (ref 5–34)
Alkaline Phosphatase: 107 U/L (ref 40–150)
BILIRUBIN TOTAL: 0.3 mg/dL (ref 0.20–1.20)
BUN: 8.2 mg/dL (ref 7.0–26.0)
CALCIUM: 8.9 mg/dL (ref 8.4–10.4)
CO2: 25 meq/L (ref 22–29)
CREATININE: 0.8 mg/dL (ref 0.6–1.1)
Chloride: 104 mEq/L (ref 98–109)
EGFR: 85 mL/min/{1.73_m2} — ABNORMAL LOW (ref >90)
Glucose: 95 mg/dl (ref 70–140)
Potassium: 3.8 mEq/L (ref 3.5–5.1)
Sodium: 137 mEq/L (ref 136–145)
TOTAL PROTEIN: 6 g/dL — AB (ref 6.4–8.3)

## 2015-02-08 LAB — CBC WITH DIFFERENTIAL/PLATELET
BASO%: 0.9 % (ref 0.0–2.0)
Basophils Absolute: 0.1 10*3/uL (ref 0.0–0.1)
EOS%: 4.2 % (ref 0.0–7.0)
Eosinophils Absolute: 0.3 10*3/uL (ref 0.0–0.5)
HEMATOCRIT: 40.5 % (ref 34.8–46.6)
HEMOGLOBIN: 13.5 g/dL (ref 11.6–15.9)
LYMPH#: 1.3 10*3/uL (ref 0.9–3.3)
LYMPH%: 18.6 % (ref 14.0–49.7)
MCH: 28.8 pg (ref 25.1–34.0)
MCHC: 33.4 g/dL (ref 31.5–36.0)
MCV: 86.2 fL (ref 79.5–101.0)
MONO#: 0.6 10*3/uL (ref 0.1–0.9)
MONO%: 9 % (ref 0.0–14.0)
NEUT%: 67.3 % (ref 38.4–76.8)
NEUTROS ABS: 4.6 10*3/uL (ref 1.5–6.5)
PLATELETS: 417 10*3/uL — AB (ref 145–400)
RBC: 4.69 10*6/uL (ref 3.70–5.45)
RDW: 12.9 % (ref 11.2–14.5)
WBC: 6.8 10*3/uL (ref 3.9–10.3)

## 2015-02-08 MED ORDER — ENOXAPARIN SODIUM 80 MG/0.8ML ~~LOC~~ SOLN
80.0000 mg | Freq: Once | SUBCUTANEOUS | Status: DC
  Administered 2015-02-08: 80 mg via SUBCUTANEOUS
  Filled 2015-02-08: qty 0.8

## 2015-02-08 MED ORDER — DEXAMETHASONE 4 MG PO TABS: ORAL_TABLET | ORAL | Status: DC

## 2015-02-08 MED ORDER — INFLUENZA VAC SPLIT QUAD 0.5 ML IM SUSY
0.5000 mL | PREFILLED_SYRINGE | Freq: Once | INTRAMUSCULAR | Status: AC
  Administered 2015-02-08: 0.5 mL via INTRAMUSCULAR
  Filled 2015-02-08: qty 0.5

## 2015-02-08 MED ORDER — ENOXAPARIN SODIUM 80 MG/0.8ML ~~LOC~~ SOLN
70.0000 mg | Freq: Once | SUBCUTANEOUS | Status: DC
  Filled 2015-02-08: qty 0.8

## 2015-02-08 MED ORDER — TRAMADOL HCL 50 MG PO TABS: 50.0000 mg | ORAL_TABLET | Freq: Three times a day (TID) | ORAL | Status: DC | PRN

## 2015-02-08 NOTE — Telephone Encounter (Signed)
Elizabeth Henry call stating that the tramadol is not helping with her back pain.  She took one  50 mg tramadol at 12 noon.  Her pain level is 9/10. She wanted to go back to taking the Hydrocodone.  Asked why Dr. Marko Plume prescribed the ultram.  Elizabeth Henry stated that she had a spot on her leg at visit this am with Dr. Marko Plume.  Dr. Marko Plume thought that the spot was not caused by the hydrocodone but was being cautious and prescribe the tramadol.  The spot on her leg is gone now.  She had her last dose of hydrocodone late last night.  Told Elizabeth Henry that the spot may have gone away since she has not taken more hydrocodone.  If she took the medication, she could have a more severe reaction to it.  Elizabeth Henry asked if she could take 2 Tramadol. Reviewed with Elizabeth Lesser NP.   Told Elizabeth Henry that she could take 100 mg of Tramadol q 8 hrs through the weekend.  She may try some naprosyn or advil along with the tramadol.  Told her that if her pain is not controled that she could go to the [facility] ED to be evaluated. Elizabeth Henry verbalized understanding.

## 2015-02-08 NOTE — Telephone Encounter (Signed)
Told Ms. Colin that the chemotherapy was approved by her insurance as noted below by Dr. Marko Plume
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
## 2015-02-08 NOTE — Telephone Encounter (Signed)
-----   Message from Gordy Levan, MD sent at 02/08/2015 11:55 AM EDT ----- Please let patient know chemo is approved by her insurance, so on go for Tues 10-11 at [facility] may need to call her to review steroid premeds on Mon 10-10  She has antiemetics, just needs decadron 4 mg 5 tabs with food 12 hrs and 6 hrs prior # 10. Suggest she also take claritin 10 mg daily begin day of chemo, for taxol aches - I do not think I told her this  thanks ----- Message -----    From: Milon Dikes    Sent: 02/08/2015  11:06 AM      To: Gordy Levan, MD  Approved, Thanks ----- Message -----    From: Gordy Levan, MD    Sent: 02/08/2015  10:00 AM      To: Milon Dikes  Carbo taxol granix  Infusion can start chemo on Tues 10-11 if ok  Thanks Lennis

## 2015-02-08 NOTE — Progress Notes (Signed)
Medical Oncology  Per managed care, preauthorization obtained for taxol carboplatin, to begin 02-12-15. Orders entered intially without diagnosis association, had to be discontinued and same orders reentered , after which authorization check mark does not show.   Godfrey Pick, MD

## 2015-02-08 NOTE — Patient Instructions (Signed)
First chemo will be at 7:30 AM on 02-12-15.  You need to take 2 doses of steroids before the chemo: Decadron (dexamethasone) 4 mg tablets: take five tablets = 20 mg with food 12 hrs before chemo and five tablets (=20 mg) with food 6 hrs before chemo.  Nausea medicines: Zofran (ondansetron) 8 mg    One tablet every 8 hrs as needed for nausea, will not make drowsy Lorazepam (ativan)  1 mg    One tablet under tongue or swallow every 6 hrs as needed for nausea. WIll make drowsy.  Linzess and senokot S fine, may need to use more regularly to keep bowels moving regularly  WIll try tramadol (ultram) for pain as needed, one tablet every 8 hrs as needed.   Call if questions or concerns  [phone]

## 2015-02-08 NOTE — Telephone Encounter (Signed)
Appointments made and avs printed for patient °

## 2015-02-09 ENCOUNTER — Other Ambulatory Visit: Payer: Self-pay | Admitting: Oncology

## 2015-02-09 DIAGNOSIS — C569 Malignant neoplasm of unspecified ovary: Secondary | ICD-10-CM

## 2015-02-11 ENCOUNTER — Telehealth: Payer: Self-pay | Admitting: Oncology

## 2015-02-11 MED ORDER — TRAMADOL HCL 50 MG PO TABS: 50.0000 mg | ORAL_TABLET | Freq: Three times a day (TID) | ORAL | Status: DC | PRN

## 2015-02-11 NOTE — Telephone Encounter (Signed)
Appointments made and patient will get a new schedule/avs at 10/11 appointment

## 2015-02-11 NOTE — Telephone Encounter (Signed)
Elizabeth Henry stated that taking 2 tramadol 50 mg tabs at night and one during the day has kept her pain level at 2/10. Will give patient a prescription for tramadol tomorrow when at [facility] for treatment.  She can use 1-2 tabs q 8 hrs as needed per Dr. Marko Plume. Reviewed the Decadron premes with patient and taking Claritin as noted below by Dr. Marko Plume.  Elizabeth Henry verbalized understanding.

## 2015-02-11 NOTE — Telephone Encounter (Signed)
-----   Message from Gordy Levan, MD sent at 02/08/2015 11:55 AM EDT ----- Please let patient know chemo is approved by her insurance, so on go for Tues 10-11 at [facility] may need to call her to review steroid premeds on Mon 10-10  She has antiemetics, just needs decadron 4 mg 5 tabs with food 12 hrs and 6 hrs prior # 10. Suggest she also take claritin 10 mg daily begin day of chemo, for taxol aches - I do not think I told her this  thanks ----- Message -----    From: Milon Dikes    Sent: 02/08/2015  11:06 AM      To: Gordy Levan, MD  Approved, Thanks ----- Message -----    From: Gordy Levan, MD    Sent: 02/08/2015  10:00 AM      To: Milon Dikes  Carbo taxol granix  Infusion can start chemo on Tues 10-11 if ok  Thanks Lennis

## 2015-02-12 ENCOUNTER — Ambulatory Visit: Payer: 59 | Admitting: Nutrition

## 2015-02-12 ENCOUNTER — Encounter: Payer: Self-pay | Admitting: Oncology

## 2015-02-12 ENCOUNTER — Ambulatory Visit (HOSPITAL_BASED_OUTPATIENT_CLINIC_OR_DEPARTMENT_OTHER): Payer: 59

## 2015-02-12 VITALS — BP 117/76 | HR 85 | Temp 97.0°F | Resp 18

## 2015-02-12 DIAGNOSIS — C569 Malignant neoplasm of unspecified ovary: Secondary | ICD-10-CM | POA: Diagnosis not present

## 2015-02-12 DIAGNOSIS — Z5111 Encounter for antineoplastic chemotherapy: Secondary | ICD-10-CM

## 2015-02-12 MED ORDER — FAMOTIDINE IN NACL 20-0.9 MG/50ML-% IV SOLN
INTRAVENOUS | Status: AC
  Filled 2015-02-12: qty 50

## 2015-02-12 MED ORDER — DIPHENHYDRAMINE HCL 50 MG/ML IJ SOLN
INTRAMUSCULAR | Status: AC
  Filled 2015-02-12: qty 1

## 2015-02-12 MED ORDER — SODIUM CHLORIDE 0.9 % IV SOLN
Freq: Once | INTRAVENOUS | Status: AC
  Administered 2015-02-12: 09:00:00 via INTRAVENOUS
  Filled 2015-02-12: qty 8

## 2015-02-12 MED ORDER — PACLITAXEL CHEMO INJECTION 300 MG/50ML
175.0000 mg/m2 | Freq: Once | INTRAVENOUS | Status: AC
  Administered 2015-02-12: 318 mg via INTRAVENOUS
  Filled 2015-02-12: qty 53

## 2015-02-12 MED ORDER — SODIUM CHLORIDE 0.9 % IV SOLN
750.0000 mg | Freq: Once | INTRAVENOUS | Status: AC
  Administered 2015-02-12: 750 mg via INTRAVENOUS
  Filled 2015-02-12: qty 75

## 2015-02-12 MED ORDER — FAMOTIDINE IN NACL 20-0.9 MG/50ML-% IV SOLN
20.0000 mg | Freq: Once | INTRAVENOUS | Status: AC
  Administered 2015-02-12: 20 mg via INTRAVENOUS

## 2015-02-12 MED ORDER — DIPHENHYDRAMINE HCL 50 MG/ML IJ SOLN
50.0000 mg | Freq: Once | INTRAMUSCULAR | Status: AC
  Administered 2015-02-12: 50 mg via INTRAVENOUS

## 2015-02-12 MED ORDER — SODIUM CHLORIDE 0.9 % IV SOLN
Freq: Once | INTRAVENOUS | Status: AC
  Administered 2015-02-12: 08:00:00 via INTRAVENOUS

## 2015-02-12 NOTE — Patient Instructions (Signed)
[facility] Discharge Instructions for Patients Receiving Chemotherapy  Today you received the following chemotherapy agents Taxol and Carboplatin  To help prevent nausea and vomiting after your treatment, we encourage you to take your nausea medication as directed by your doctor.   If you develop nausea and vomiting that is not controlled by your nausea medication, call the clinic.   BELOW ARE SYMPTOMS THAT SHOULD BE REPORTED IMMEDIATELY:  *FEVER GREATER THAN 100.5 F  *CHILLS WITH OR WITHOUT FEVER  NAUSEA AND VOMITING THAT IS NOT CONTROLLED WITH YOUR NAUSEA MEDICATION  *UNUSUAL SHORTNESS OF BREATH  *UNUSUAL BRUISING OR BLEEDING  TENDERNESS IN MOUTH AND THROAT WITH OR WITHOUT PRESENCE OF ULCERS  *URINARY PROBLEMS  *BOWEL PROBLEMS   Paclitaxel injection What is this medicine? PACLITAXEL (PAK li TAX el) is a chemotherapy drug. It targets fast dividing cells, like cancer cells, and causes these cells to die. This medicine is used to treat ovarian cancer, breast cancer, and other cancers. This medicine may be used for other purposes; ask your health care provider or pharmacist if you have questions. What should I tell my health care provider before I take this medicine? They need to know if you have any of these conditions: -blood disorders -irregular heartbeat -infection (especially a virus infection such as chickenpox, cold sores, or herpes) -liver disease -previous or ongoing radiation therapy -an unusual or allergic reaction to paclitaxel, alcohol, polyoxyethylated castor oil, other chemotherapy agents, other medicines, foods, dyes, or preservatives -pregnant or trying to get pregnant -breast-feeding How should I use this medicine? This drug is given as an infusion into a vein. It is administered in a hospital or clinic by a specially trained health care professional. Talk to your pediatrician regarding the use of this medicine in children. Special care may  be needed. Overdosage: If you think you have taken too much of this medicine contact a poison control center or emergency room at once. NOTE: This medicine is only for you. Do not share this medicine with others. What if I miss a dose? It is important not to miss your dose. Call your doctor or health care professional if you are unable to keep an appointment. What may interact with this medicine? Do not take this medicine with any of the following medications: -disulfiram -metronidazole This medicine may also interact with the following medications: -cyclosporine -diazepam -ketoconazole -medicines to increase blood counts like filgrastim, pegfilgrastim, sargramostim -other chemotherapy drugs like cisplatin, doxorubicin, epirubicin, etoposide, teniposide, vincristine -quinidine -testosterone -vaccines -verapamil Talk to your doctor or health care professional before taking any of these medicines: -acetaminophen -aspirin -ibuprofen -ketoprofen -naproxen This list may not describe all possible interactions. Give your health care provider a list of all the medicines, herbs, non-prescription drugs, or dietary supplements you use. Also tell them if you smoke, drink alcohol, or use illegal drugs. Some items may interact with your medicine. What should I watch for while using this medicine? Your condition will be monitored carefully while you are receiving this medicine. You will need important blood work done while you are taking this medicine. This drug may make you feel generally unwell. This is not uncommon, as chemotherapy can affect healthy cells as well as cancer cells. Report any side effects. Continue your course of treatment even though you feel ill unless your doctor tells you to stop. This medicine can cause serious allergic reactions. To reduce your risk you will need to take other medicine(s) before treatment with this medicine. In some cases, you  may be given additional medicines  to help with side effects. Follow all directions for their use. Call your doctor or health care professional for advice if you get a fever, chills or sore throat, or other symptoms of a cold or flu. Do not treat yourself. This drug decreases your body's ability to fight infections. Try to avoid being around people who are sick. This medicine may increase your risk to bruise or bleed. Call your doctor or health care professional if you notice any unusual bleeding. Be careful brushing and flossing your teeth or using a toothpick because you may get an infection or bleed more easily. If you have any dental work done, tell your dentist you are receiving this medicine. Avoid taking products that contain aspirin, acetaminophen, ibuprofen, naproxen, or ketoprofen unless instructed by your doctor. These medicines may hide a fever. Do not become pregnant while taking this medicine. Women should inform their doctor if they wish to become pregnant or think they might be pregnant. There is a potential for serious side effects to an unborn child. Talk to your health care professional or pharmacist for more information. Do not breast-feed an infant while taking this medicine. Men are advised not to father a child while receiving this medicine. This product may contain alcohol. Ask your pharmacist or healthcare provider if this medicine contains alcohol. Be sure to tell all healthcare providers you are taking this medicine. Certain medicines, like metronidazole and disulfiram, can cause an unpleasant reaction when taken with alcohol. The reaction includes flushing, headache, nausea, vomiting, sweating, and increased thirst. The reaction can last from 30 minutes to several hours. What side effects may I notice from receiving this medicine? Side effects that you should report to your doctor or health care professional as soon as possible: -allergic reactions like skin rash, itching or hives, swelling of the face, lips, or  tongue -low blood counts - This drug may decrease the number of white blood cells, red blood cells and platelets. You may be at increased risk for infections and bleeding. -signs of infection - fever or chills, cough, sore throat, pain or difficulty passing urine -signs of decreased platelets or bleeding - bruising, pinpoint red spots on the skin, black, tarry stools, nosebleeds -signs of decreased red blood cells - unusually weak or tired, fainting spells, lightheadedness -breathing problems -chest pain -high or low blood pressure -mouth sores -nausea and vomiting -pain, swelling, redness or irritation at the injection site -pain, tingling, numbness in the hands or feet -slow or irregular heartbeat -swelling of the ankle, feet, hands Side effects that usually do not require medical attention (report to your doctor or health care professional if they continue or are bothersome): -bone pain -complete hair loss including hair on your head, underarms, pubic hair, eyebrows, and eyelashes -changes in the color of fingernails -diarrhea -loosening of the fingernails -loss of appetite -muscle or joint pain -red flush to skin -sweating This list may not describe all possible side effects. Call your doctor for medical advice about side effects. You may report side effects to FDA at 1-800-FDA-1088. Where should I keep my medicine? This drug is given in a hospital or clinic and will not be stored at home. NOTE: This sheet is a summary. It may not cover all possible information. If you have questions about this medicine, talk to your doctor, pharmacist, or health care provider.    2016, [employer] Standard. (2014-12-06 13:02:56)   UNUSUAL RASH Items with * indicate a potential emergency and should  be followed up as soon as possible.  Feel free to call the clinic you have any questions or concerns. The clinic phone number is [phone].  Please show the [facility] at check-in to  the Emergency Department and triage nurse.   Carboplatin injection What is this medicine? CARBOPLATIN (KAR boe pla tin) is a chemotherapy drug. It targets fast dividing cells, like cancer cells, and causes these cells to die. This medicine is used to treat ovarian cancer and many other cancers. This medicine may be used for other purposes; ask your health care provider or pharmacist if you have questions. What should I tell my health care provider before I take this medicine? They need to know if you have any of these conditions: -blood disorders -hearing problems -kidney disease -recent or ongoing radiation therapy -an unusual or allergic reaction to carboplatin, cisplatin, other chemotherapy, other medicines, foods, dyes, or preservatives -pregnant or trying to get pregnant -breast-feeding How should I use this medicine? This drug is usually given as an infusion into a vein. It is administered in a hospital or clinic by a specially trained health care professional. Talk to your pediatrician regarding the use of this medicine in children. Special care may be needed. Overdosage: If you think you have taken too much of this medicine contact a poison control center or emergency room at once. NOTE: This medicine is only for you. Do not share this medicine with others. What if I miss a dose? It is important not to miss a dose. Call your doctor or health care professional if you are unable to keep an appointment. What may interact with this medicine? -medicines for seizures -medicines to increase blood counts like filgrastim, pegfilgrastim, sargramostim -some antibiotics like amikacin, gentamicin, neomycin, streptomycin, tobramycin -vaccines Talk to your doctor or health care professional before taking any of these medicines: -acetaminophen -aspirin -ibuprofen -ketoprofen -naproxen This list may not describe all possible interactions. Give your health care provider a list of all the  medicines, herbs, non-prescription drugs, or dietary supplements you use. Also tell them if you smoke, drink alcohol, or use illegal drugs. Some items may interact with your medicine. What should I watch for while using this medicine? Your condition will be monitored carefully while you are receiving this medicine. You will need important blood work done while you are taking this medicine. This drug may make you feel generally unwell. This is not uncommon, as chemotherapy can affect healthy cells as well as cancer cells. Report any side effects. Continue your course of treatment even though you feel ill unless your doctor tells you to stop. In some cases, you may be given additional medicines to help with side effects. Follow all directions for their use. Call your doctor or health care professional for advice if you get a fever, chills or sore throat, or other symptoms of a cold or flu. Do not treat yourself. This drug decreases your body's ability to fight infections. Try to avoid being around people who are sick. This medicine may increase your risk to bruise or bleed. Call your doctor or health care professional if you notice any unusual bleeding. Be careful brushing and flossing your teeth or using a toothpick because you may get an infection or bleed more easily. If you have any dental work done, tell your dentist you are receiving this medicine. Avoid taking products that contain aspirin, acetaminophen, ibuprofen, naproxen, or ketoprofen unless instructed by your doctor. These medicines may hide a fever. Do not  become pregnant while taking this medicine. Women should inform their doctor if they wish to become pregnant or think they might be pregnant. There is a potential for serious side effects to an unborn child. Talk to your health care professional or pharmacist for more information. Do not breast-feed an infant while taking this medicine. What side effects may I notice from receiving this  medicine? Side effects that you should report to your doctor or health care professional as soon as possible: -allergic reactions like skin rash, itching or hives, swelling of the face, lips, or tongue -signs of infection - fever or chills, cough, sore throat, pain or difficulty passing urine -signs of decreased platelets or bleeding - bruising, pinpoint red spots on the skin, black, tarry stools, nosebleeds -signs of decreased red blood cells - unusually weak or tired, fainting spells, lightheadedness -breathing problems -changes in hearing -changes in vision -chest pain -high blood pressure -low blood counts - This drug may decrease the number of white blood cells, red blood cells and platelets. You may be at increased risk for infections and bleeding. -nausea and vomiting -pain, swelling, redness or irritation at the injection site -pain, tingling, numbness in the hands or feet -problems with balance, talking, walking -trouble passing urine or change in the amount of urine Side effects that usually do not require medical attention (report to your doctor or health care professional if they continue or are bothersome): -hair loss -loss of appetite -metallic taste in the mouth or changes in taste This list may not describe all possible side effects. Call your doctor for medical advice about side effects. You may report side effects to FDA at 1-800-FDA-1088. Where should I keep my medicine? This drug is given in a hospital or clinic and will not be stored at home. NOTE: This sheet is a summary. It may not cover all possible information. If you have questions about this medicine, talk to your doctor, pharmacist, or health care provider.    2016, [employer] Standard. (2007-07-26 14:38:05)

## 2015-02-12 NOTE — Progress Notes (Signed)
Met w/ regarding copay assistance for her chemo drugs.  I informed her that unfortunately the foundations that cover her drugs with her Dx are closed.  We can revisit at the beginning of the year.  Pt would like to apply for the [facility] and [employer] grants.  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

## 2015-02-12 NOTE — Progress Notes (Signed)
44 year old female diagnosed with likely ovarian cancer.  She is a patient of Dr. Marko Plume.  Past medical history includes anxiety, migraines, and GERD.  Medications include vitamin D, Decadron, Ativan, Zofran, Protonix, and Effexor.  Labs include albumin 3.0.  Height: 63 inches. Weight: 163.7 pounds. (reflects ascites) Usual body weight: 142-145 pounds. BMI: 29.01.  Patient reports early satiety and increased ascites. She has ongoing problems with constipation. Patient states she had 1.6 L of fluid removed.  However, it reaccumulated quite quickly.  Nutrition diagnosis: Food and nutrition related knowledge deficit related to new diagnosis of cancer as evidenced by no prior need for nutrition related information.  Intervention: Educated patient on the importance of small frequent meals and snacks with high protein foods and adequate calories. Encouraged patient to consume fluids between meals and snacks. Provided basic education on low sodium diet. Reviewed strategies for improving constipation. Provided fact sheets and contact information.  Questions were answered.  Monitoring, evaluation, goals: Patient will tolerate adequate calories and protein to maintain lean body mass.  Next visit: Tuesday, November 1 during infusion.  **Disclaimer: This note was dictated with voice recognition software. Similar sounding words can inadvertently be transcribed and this note may contain transcription errors which may not have been corrected upon publication of note.**

## 2015-02-13 ENCOUNTER — Other Ambulatory Visit: Payer: Self-pay | Admitting: Oncology

## 2015-02-13 ENCOUNTER — Telehealth: Payer: Self-pay

## 2015-02-13 DIAGNOSIS — C569 Malignant neoplasm of unspecified ovary: Secondary | ICD-10-CM

## 2015-02-13 NOTE — Telephone Encounter (Signed)
-----   Message from Otila Kluver, RN sent at 02/12/2015 10:14 AM EDT ----- Regarding: Dr. Marko Plume chemo follow up call 1st time Taxol and Carboplatin... Dr. Marko Plume

## 2015-02-13 NOTE — Telephone Encounter (Signed)
Elizabeth Henry is doing very well today.  No n/v.  Her appetite has picked up a little.  Eating small frequent meals as instructed by dietitian Dory Peru. Reminded her that the zofran can cause constipation and not to go longer than 3 days with out a bowel movement. Reviewed that the taxol aches may begin tomorrow.  She did begin claritin yesterday. Drinking fluids well.  She knows to call [phone] if she has nay concerns or issues.

## 2015-02-14 ENCOUNTER — Encounter: Payer: Self-pay | Admitting: Oncology

## 2015-02-14 NOTE — Progress Notes (Signed)
Pt is approved for the [facility] and [employer] grants.  °

## 2015-02-15 ENCOUNTER — Other Ambulatory Visit (HOSPITAL_BASED_OUTPATIENT_CLINIC_OR_DEPARTMENT_OTHER): Payer: 59

## 2015-02-15 ENCOUNTER — Other Ambulatory Visit: Payer: Self-pay | Admitting: Oncology

## 2015-02-15 ENCOUNTER — Telehealth: Payer: Self-pay

## 2015-02-15 ENCOUNTER — Ambulatory Visit (HOSPITAL_BASED_OUTPATIENT_CLINIC_OR_DEPARTMENT_OTHER): Payer: 59

## 2015-02-15 VITALS — BP 118/78 | HR 86 | Temp 98.5°F | Resp 18

## 2015-02-15 DIAGNOSIS — C569 Malignant neoplasm of unspecified ovary: Secondary | ICD-10-CM | POA: Diagnosis not present

## 2015-02-15 LAB — BASIC METABOLIC PANEL (CC13)
Anion Gap: 10 mEq/L (ref 3–11)
BUN: 7.3 mg/dL (ref 7.0–26.0)
CHLORIDE: 100 meq/L (ref 98–109)
CO2: 26 meq/L (ref 22–29)
Calcium: 9.1 mg/dL (ref 8.4–10.4)
Creatinine: 0.8 mg/dL (ref 0.6–1.1)
EGFR: 88 mL/min/{1.73_m2} — AB (ref >90)
Glucose: 99 mg/dl (ref 70–140)
Potassium: 4.1 mEq/L (ref 3.5–5.1)
Sodium: 136 mEq/L (ref 136–145)

## 2015-02-15 LAB — CBC WITH DIFFERENTIAL/PLATELET
BASO%: 0.8 % (ref 0.0–2.0)
BASOS ABS: 0 10*3/uL (ref 0.0–0.1)
EOS ABS: 0.3 10*3/uL (ref 0.0–0.5)
EOS%: 5.7 % (ref 0.0–7.0)
HEMATOCRIT: 41.3 % (ref 34.8–46.6)
HEMOGLOBIN: 13.6 g/dL (ref 11.6–15.9)
LYMPH%: 15.9 % (ref 14.0–49.7)
MCH: 28.4 pg (ref 25.1–34.0)
MCHC: 32.9 g/dL (ref 31.5–36.0)
MCV: 86.3 fL (ref 79.5–101.0)
MONO#: 0.1 10*3/uL (ref 0.1–0.9)
MONO%: 1.6 % (ref 0.0–14.0)
NEUT#: 4.5 10*3/uL (ref 1.5–6.5)
NEUT%: 76 % (ref 38.4–76.8)
Platelets: 419 10*3/uL — ABNORMAL HIGH (ref 145–400)
RBC: 4.79 10*6/uL (ref 3.70–5.45)
RDW: 12.9 % (ref 11.2–14.5)
WBC: 5.9 10*3/uL (ref 3.9–10.3)
lymph#: 0.9 10*3/uL (ref 0.9–3.3)

## 2015-02-15 MED ORDER — SODIUM CHLORIDE 0.9 % IV SOLN
Freq: Once | INTRAVENOUS | Status: AC
  Administered 2015-02-15: 16:00:00 via INTRAVENOUS
  Filled 2015-02-15: qty 8

## 2015-02-15 MED ORDER — SODIUM CHLORIDE 0.9 % IV SOLN: 20.0000 mg | Freq: Once | INTRAVENOUS | Status: DC

## 2015-02-15 MED ORDER — SODIUM CHLORIDE 0.9 % IV SOLN
1000.0000 mL | Freq: Once | INTRAVENOUS | Status: AC
  Administered 2015-02-15: 1000 mL via INTRAVENOUS

## 2015-02-15 MED ORDER — SODIUM CHLORIDE 0.9 % IV SOLN: Freq: Once | INTRAVENOUS | Status: DC

## 2015-02-15 MED ORDER — PROMETHAZINE HCL 25 MG RE SUPP: 25.0000 mg | Freq: Four times a day (QID) | RECTAL | Status: DC | PRN

## 2015-02-15 NOTE — Telephone Encounter (Signed)
Elizabeth Henry states that she has been nauseous all day.  No vomiting.  She has only taken in 12 oz of water today.  No intake of food. She took a Zofran tablet 8 mg at 0500 and repeated tab at ~1320. She has not tried Ativan sl or po. Reviewed above with Dr. Marko Plume. Told Elizabeth Henry that she can come to the [facility] at 1500 and check in for lab at 1515 followed by IVF and antiemetics. Will send prescription for phenergan 25 mg suppositories q 6 hrs prn to have on hand through the weekend.  Elizabeth Henry verbalized understanding.

## 2015-02-18 ENCOUNTER — Ambulatory Visit (HOSPITAL_BASED_OUTPATIENT_CLINIC_OR_DEPARTMENT_OTHER): Payer: 59 | Admitting: Oncology

## 2015-02-18 ENCOUNTER — Telehealth: Payer: Self-pay | Admitting: Oncology

## 2015-02-18 ENCOUNTER — Encounter: Payer: Self-pay | Admitting: Oncology

## 2015-02-18 ENCOUNTER — Other Ambulatory Visit (HOSPITAL_BASED_OUTPATIENT_CLINIC_OR_DEPARTMENT_OTHER): Payer: 59

## 2015-02-18 VITALS — BP 124/85 | HR 107 | Temp 98.8°F | Resp 18 | Ht 63.0 in | Wt 153.6 lb

## 2015-02-18 DIAGNOSIS — C569 Malignant neoplasm of unspecified ovary: Secondary | ICD-10-CM | POA: Diagnosis not present

## 2015-02-18 DIAGNOSIS — Z7901 Long term (current) use of anticoagulants: Secondary | ICD-10-CM

## 2015-02-18 DIAGNOSIS — J910 Malignant pleural effusion: Secondary | ICD-10-CM

## 2015-02-18 DIAGNOSIS — D701 Agranulocytosis secondary to cancer chemotherapy: Secondary | ICD-10-CM | POA: Diagnosis not present

## 2015-02-18 DIAGNOSIS — R180 Malignant ascites: Secondary | ICD-10-CM

## 2015-02-18 DIAGNOSIS — I2699 Other pulmonary embolism without acute cor pulmonale: Secondary | ICD-10-CM

## 2015-02-18 DIAGNOSIS — T451X5A Adverse effect of antineoplastic and immunosuppressive drugs, initial encounter: Secondary | ICD-10-CM

## 2015-02-18 LAB — CBC WITH DIFFERENTIAL/PLATELET
BASO%: 1.2 % (ref 0.0–2.0)
Basophils Absolute: 0 10*3/uL (ref 0.0–0.1)
EOS%: 9.2 % — ABNORMAL HIGH (ref 0.0–7.0)
Eosinophils Absolute: 0.4 10*3/uL (ref 0.0–0.5)
HEMATOCRIT: 40.3 % (ref 34.8–46.6)
HGB: 13.1 g/dL (ref 11.6–15.9)
LYMPH#: 1.1 10*3/uL (ref 0.9–3.3)
LYMPH%: 28.9 % (ref 14.0–49.7)
MCH: 27.8 pg (ref 25.1–34.0)
MCHC: 32.4 g/dL (ref 31.5–36.0)
MCV: 85.9 fL (ref 79.5–101.0)
MONO#: 0.1 10*3/uL (ref 0.1–0.9)
MONO%: 2.9 % (ref 0.0–14.0)
NEUT%: 57.8 % (ref 38.4–76.8)
NEUTROS ABS: 2.2 10*3/uL (ref 1.5–6.5)
PLATELETS: 390 10*3/uL (ref 145–400)
RBC: 4.69 10*6/uL (ref 3.70–5.45)
RDW: 12.7 % (ref 11.2–14.5)
WBC: 3.8 10*3/uL — AB (ref 3.9–10.3)

## 2015-02-18 LAB — COMPREHENSIVE METABOLIC PANEL (CC13)
ALT: 36 U/L (ref 0–55)
ANION GAP: 9 meq/L (ref 3–11)
AST: 36 U/L — ABNORMAL HIGH (ref 5–34)
Albumin: 3.5 g/dL (ref 3.5–5.0)
Alkaline Phosphatase: 108 U/L (ref 40–150)
BILIRUBIN TOTAL: 0.36 mg/dL (ref 0.20–1.20)
BUN: 7.7 mg/dL (ref 7.0–26.0)
CO2: 28 meq/L (ref 22–29)
CREATININE: 0.9 mg/dL (ref 0.6–1.1)
Calcium: 9.5 mg/dL (ref 8.4–10.4)
Chloride: 102 mEq/L (ref 98–109)
EGFR: 73 mL/min/{1.73_m2} — ABNORMAL LOW (ref >90)
Glucose: 96 mg/dl (ref 70–140)
Potassium: 4.1 mEq/L (ref 3.5–5.1)
Sodium: 139 mEq/L (ref 136–145)
TOTAL PROTEIN: 6.7 g/dL (ref 6.4–8.3)

## 2015-02-18 MED ORDER — SALINE SPRAY 0.65 % NA SOLN: 1.0000 | NASAL | Status: DC | PRN

## 2015-02-18 MED ORDER — TBO-FILGRASTIM 300 MCG/0.5ML ~~LOC~~ SOSY
300.0000 ug | PREFILLED_SYRINGE | Freq: Once | SUBCUTANEOUS | Status: AC
  Administered 2015-02-18: 300 ug via SUBCUTANEOUS
  Filled 2015-02-18: qty 0.5

## 2015-02-18 NOTE — Telephone Encounter (Signed)
Appointments made and avs printed for pateint °
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
## 2015-02-19 DIAGNOSIS — D701 Agranulocytosis secondary to cancer chemotherapy: Secondary | ICD-10-CM | POA: Insufficient documentation

## 2015-02-19 DIAGNOSIS — T451X5A Adverse effect of antineoplastic and immunosuppressive drugs, initial encounter: Secondary | ICD-10-CM

## 2015-02-20 ENCOUNTER — Other Ambulatory Visit (HOSPITAL_BASED_OUTPATIENT_CLINIC_OR_DEPARTMENT_OTHER): Payer: 59

## 2015-02-20 ENCOUNTER — Ambulatory Visit (HOSPITAL_BASED_OUTPATIENT_CLINIC_OR_DEPARTMENT_OTHER): Payer: 59

## 2015-02-20 VITALS — BP 130/82 | HR 81 | Temp 98.3°F

## 2015-02-20 DIAGNOSIS — Z5189 Encounter for other specified aftercare: Secondary | ICD-10-CM

## 2015-02-20 DIAGNOSIS — C569 Malignant neoplasm of unspecified ovary: Secondary | ICD-10-CM

## 2015-02-20 LAB — CBC WITH DIFFERENTIAL/PLATELET
BASO%: 0.4 % (ref 0.0–2.0)
Basophils Absolute: 0 10*3/uL (ref 0.0–0.1)
EOS ABS: 0.3 10*3/uL (ref 0.0–0.5)
EOS%: 3.9 % (ref 0.0–7.0)
HCT: 36.2 % (ref 34.8–46.6)
HEMOGLOBIN: 11.8 g/dL (ref 11.6–15.9)
LYMPH#: 0.9 10*3/uL (ref 0.9–3.3)
LYMPH%: 12.8 % — ABNORMAL LOW (ref 14.0–49.7)
MCH: 28.7 pg (ref 25.1–34.0)
MCHC: 32.6 g/dL (ref 31.5–36.0)
MCV: 88.1 fL (ref 79.5–101.0)
MONO#: 0.5 10*3/uL (ref 0.1–0.9)
MONO%: 7.4 % (ref 0.0–14.0)
NEUT%: 75.5 % (ref 38.4–76.8)
NEUTROS ABS: 5.4 10*3/uL (ref 1.5–6.5)
Platelets: 289 10*3/uL (ref 145–400)
RBC: 4.11 10*6/uL (ref 3.70–5.45)
RDW: 12.7 % (ref 11.2–14.5)
WBC: 7.1 10*3/uL (ref 3.9–10.3)

## 2015-02-20 MED ORDER — TBO-FILGRASTIM 300 MCG/0.5ML ~~LOC~~ SOSY
300.0000 ug | PREFILLED_SYRINGE | Freq: Once | SUBCUTANEOUS | Status: AC
  Administered 2015-02-20: 300 ug via SUBCUTANEOUS
  Filled 2015-02-20: qty 0.5

## 2015-02-24 ENCOUNTER — Other Ambulatory Visit: Payer: Self-pay | Admitting: Oncology

## 2015-02-25 ENCOUNTER — Telehealth: Payer: Self-pay | Admitting: Oncology

## 2015-02-25 ENCOUNTER — Other Ambulatory Visit (HOSPITAL_BASED_OUTPATIENT_CLINIC_OR_DEPARTMENT_OTHER): Payer: 59

## 2015-02-25 ENCOUNTER — Other Ambulatory Visit: Payer: Self-pay | Admitting: *Deleted

## 2015-02-25 ENCOUNTER — Ambulatory Visit: Payer: 59 | Admitting: Family Medicine

## 2015-02-25 ENCOUNTER — Ambulatory Visit (HOSPITAL_BASED_OUTPATIENT_CLINIC_OR_DEPARTMENT_OTHER): Payer: 59 | Admitting: Oncology

## 2015-02-25 VITALS — BP 123/82 | HR 96 | Temp 98.5°F | Resp 18 | Ht 63.0 in | Wt 151.4 lb

## 2015-02-25 DIAGNOSIS — F419 Anxiety disorder, unspecified: Secondary | ICD-10-CM | POA: Diagnosis not present

## 2015-02-25 DIAGNOSIS — R180 Malignant ascites: Secondary | ICD-10-CM

## 2015-02-25 DIAGNOSIS — C569 Malignant neoplasm of unspecified ovary: Secondary | ICD-10-CM

## 2015-02-25 DIAGNOSIS — J910 Malignant pleural effusion: Secondary | ICD-10-CM

## 2015-02-25 DIAGNOSIS — Z7901 Long term (current) use of anticoagulants: Secondary | ICD-10-CM

## 2015-02-25 DIAGNOSIS — K5900 Constipation, unspecified: Secondary | ICD-10-CM

## 2015-02-25 DIAGNOSIS — Z86711 Personal history of pulmonary embolism: Secondary | ICD-10-CM

## 2015-02-25 DIAGNOSIS — I2699 Other pulmonary embolism without acute cor pulmonale: Secondary | ICD-10-CM

## 2015-02-25 LAB — CBC WITH DIFFERENTIAL/PLATELET
BASO%: 1.1 % (ref 0.0–2.0)
Basophils Absolute: 0.1 10*3/uL (ref 0.0–0.1)
EOS ABS: 0.2 10*3/uL (ref 0.0–0.5)
EOS%: 3.3 % (ref 0.0–7.0)
HEMATOCRIT: 37.2 % (ref 34.8–46.6)
HGB: 12.2 g/dL (ref 11.6–15.9)
LYMPH%: 31.1 % (ref 14.0–49.7)
MCH: 28.4 pg (ref 25.1–34.0)
MCHC: 32.7 g/dL (ref 31.5–36.0)
MCV: 86.8 fL (ref 79.5–101.0)
MONO#: 0.4 10*3/uL (ref 0.1–0.9)
MONO%: 7.7 % (ref 0.0–14.0)
NEUT#: 3.1 10*3/uL (ref 1.5–6.5)
NEUT%: 56.8 % (ref 38.4–76.8)
PLATELETS: 265 10*3/uL (ref 145–400)
RBC: 4.28 10*6/uL (ref 3.70–5.45)
RDW: 12.9 % (ref 11.2–14.5)
WBC: 5.5 10*3/uL (ref 3.9–10.3)
lymph#: 1.7 10*3/uL (ref 0.9–3.3)

## 2015-02-25 LAB — COMPREHENSIVE METABOLIC PANEL (CC13)
ALT: 37 U/L (ref 0–55)
ANION GAP: 8 meq/L (ref 3–11)
AST: 29 U/L (ref 5–34)
Albumin: 3.7 g/dL (ref 3.5–5.0)
Alkaline Phosphatase: 123 U/L (ref 40–150)
BUN: 9 mg/dL (ref 7.0–26.0)
CALCIUM: 9.6 mg/dL (ref 8.4–10.4)
CHLORIDE: 106 meq/L (ref 98–109)
CO2: 28 meq/L (ref 22–29)
CREATININE: 0.9 mg/dL (ref 0.6–1.1)
EGFR: 82 mL/min/{1.73_m2} — AB (ref >90)
Glucose: 78 mg/dl (ref 70–140)
Potassium: 4 mEq/L (ref 3.5–5.1)
Sodium: 142 mEq/L (ref 136–145)
Total Protein: 6.7 g/dL (ref 6.4–8.3)

## 2015-02-25 MED ORDER — LORAZEPAM 1 MG PO TABS: ORAL_TABLET | ORAL | Status: DC

## 2015-02-25 MED ORDER — DEXAMETHASONE 4 MG PO TABS: ORAL_TABLET | ORAL | Status: DC

## 2015-02-25 MED ORDER — ENOXAPARIN SODIUM 80 MG/0.8ML ~~LOC~~ SOLN: 1.0000 mg/kg | Freq: Two times a day (BID) | SUBCUTANEOUS | Status: DC

## 2015-02-25 NOTE — Telephone Encounter (Signed)
Per pof patient will get new avs with cancelled 11/1 lab
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
## 2015-02-26 ENCOUNTER — Other Ambulatory Visit: Payer: Self-pay

## 2015-02-26 ENCOUNTER — Telehealth: Payer: Self-pay

## 2015-02-26 ENCOUNTER — Telehealth: Payer: Self-pay | Admitting: *Deleted

## 2015-02-26 DIAGNOSIS — Z7901 Long term (current) use of anticoagulants: Secondary | ICD-10-CM

## 2015-02-26 DIAGNOSIS — C569 Malignant neoplasm of unspecified ovary: Secondary | ICD-10-CM

## 2015-02-26 MED ORDER — ENOXAPARIN SODIUM 80 MG/0.8ML ~~LOC~~ SOLN
1.0000 mg/kg | Freq: Two times a day (BID) | SUBCUTANEOUS | Status: DC
Start: 2015-02-26 — End: 2015-04-04

## 2015-02-26 MED ORDER — DEXAMETHASONE 4 MG PO TABS: ORAL_TABLET | ORAL | Status: DC

## 2015-02-26 NOTE — Telephone Encounter (Signed)
Elizabeth Henry from [facility] in [place] reports they were "able to get Lorazepam order through this time despite high dose notification per [facility].  For future refills at this current dose of four to five pills each day a Prior Authorization will be required.  You can go ahead and start the process through [place] at [phone]."

## 2015-02-26 NOTE — Telephone Encounter (Signed)
Re ativan script If it is covered for present script, that is ok for now.  Wonder if better with next script to write it for 2 mg tablets as she uses 1 mg for chemo nausea now (could split tabs), and takes 2 mg at hs x years. Pharmacist at [facility] may be able to help [place] figure this out with next script.  thanks

## 2015-02-26 NOTE — Telephone Encounter (Signed)
Pt called and s/w Dawn. She asked for her prescriptions from yesterday be sent to [facility] outpatient pharmacy these were lorazepam, lovenox and decadron. She states her belly was 9 months size and now it is 4 month size. She is asking when it is safe to have intercourse.

## 2015-02-26 NOTE — Telephone Encounter (Signed)
lvm for pt to clarify if she wants lorazepam, lovenox and decadron to go to [facility] outpatient pharmacy or which one(s) in particular.

## 2015-02-26 NOTE — Telephone Encounter (Signed)
S/w pt. She wants all 3 transferred to [facility]. She has monetary assistance there. She was saying she uses 2 tabs lorazepam qHS for sleep. If she is to use 1 or 2 during the day also, she will need an Rx for >60 tabs to get through the month. Looking at the Rx: it may need to be reworded for 1 or 2 tabs during the day and 1-2 qHS.  [employer] [facility] the [facility] for [facility] time but was requiring a prior authorization for any further refills. [employer] sent a "high dose notification" to [facility].

## 2015-02-26 NOTE — Telephone Encounter (Signed)
Ativan 1 mg 2 tablets at hs prn sleep 1 tablet every 6 hrs prn nausea # 30   Would not be sexually active now due to pelvic involvement   thanks

## 2015-02-26 NOTE — Telephone Encounter (Signed)
S/w pt about no sexual activity for now.

## 2015-02-26 NOTE — Telephone Encounter (Signed)
S/w Kristi at [facility] and discussed situation with the lorazepam. She says she could fill a 9 day supply at present so [facility] will cover it, [facility] to have [facility] correct their records that pt did not fill it there, she will call pt when lorazepam, lovenox, and decadron are ready. A request has been sent to financial assistance to get the prior authorization for refill - at which time we can hopefully be able to write Lorazepam Rx for more than 9 days.

## 2015-02-27 ENCOUNTER — Encounter: Payer: Self-pay | Admitting: Oncology

## 2015-02-27 NOTE — Progress Notes (Signed)
Available without authorization. I recd message from susan at [facility] about Lorazepam. She said the got to go thru(for high dosage) but wanted [place] to go and get prior auth for going forward. Per [employer]--no auth needed. If script changes then we would probably need to get.

## 2015-02-28 ENCOUNTER — Other Ambulatory Visit (HOSPITAL_BASED_OUTPATIENT_CLINIC_OR_DEPARTMENT_OTHER): Payer: 59

## 2015-02-28 ENCOUNTER — Ambulatory Visit (HOSPITAL_BASED_OUTPATIENT_CLINIC_OR_DEPARTMENT_OTHER): Payer: 59 | Admitting: Genetic Counselor

## 2015-02-28 ENCOUNTER — Encounter: Payer: Self-pay | Admitting: Genetic Counselor

## 2015-02-28 DIAGNOSIS — C569 Malignant neoplasm of unspecified ovary: Secondary | ICD-10-CM

## 2015-02-28 DIAGNOSIS — Z806 Family history of leukemia: Secondary | ICD-10-CM

## 2015-02-28 DIAGNOSIS — Z809 Family history of malignant neoplasm, unspecified: Secondary | ICD-10-CM

## 2015-02-28 DIAGNOSIS — Z803 Family history of malignant neoplasm of breast: Secondary | ICD-10-CM

## 2015-02-28 DIAGNOSIS — Z789 Other specified health status: Secondary | ICD-10-CM

## 2015-02-28 DIAGNOSIS — R971 Elevated cancer antigen 125 [CA 125]: Secondary | ICD-10-CM

## 2015-02-28 DIAGNOSIS — Z8489 Family history of other specified conditions: Secondary | ICD-10-CM

## 2015-02-28 DIAGNOSIS — IMO0001 Reserved for inherently not codable concepts without codable children: Secondary | ICD-10-CM

## 2015-02-28 LAB — COMPREHENSIVE METABOLIC PANEL (CC13)
ALBUMIN: 4.1 g/dL (ref 3.5–5.0)
ALK PHOS: 126 U/L (ref 40–150)
ALT: 42 U/L (ref 0–55)
AST: 29 U/L (ref 5–34)
Anion Gap: 7 mEq/L (ref 3–11)
BILIRUBIN TOTAL: 0.35 mg/dL (ref 0.20–1.20)
BUN: 10.8 mg/dL (ref 7.0–26.0)
CALCIUM: 9.7 mg/dL (ref 8.4–10.4)
CO2: 27 mEq/L (ref 22–29)
Chloride: 106 mEq/L (ref 98–109)
Creatinine: 0.9 mg/dL (ref 0.6–1.1)
EGFR: 82 mL/min/{1.73_m2} — ABNORMAL LOW (ref >90)
GLUCOSE: 77 mg/dL (ref 70–140)
POTASSIUM: 4.3 meq/L (ref 3.5–5.1)
SODIUM: 139 meq/L (ref 136–145)
TOTAL PROTEIN: 7.3 g/dL (ref 6.4–8.3)

## 2015-02-28 LAB — CBC WITH DIFFERENTIAL/PLATELET
BASO%: 0.4 % (ref 0.0–2.0)
BASOS ABS: 0 10*3/uL (ref 0.0–0.1)
EOS ABS: 0.1 10*3/uL (ref 0.0–0.5)
EOS%: 1.7 % (ref 0.0–7.0)
HEMATOCRIT: 38.5 % (ref 34.8–46.6)
HEMOGLOBIN: 12.5 g/dL (ref 11.6–15.9)
LYMPH#: 1.5 10*3/uL (ref 0.9–3.3)
LYMPH%: 31.8 % (ref 14.0–49.7)
MCH: 28.5 pg (ref 25.1–34.0)
MCHC: 32.5 g/dL (ref 31.5–36.0)
MCV: 87.7 fL (ref 79.5–101.0)
MONO#: 0.3 10*3/uL (ref 0.1–0.9)
MONO%: 5.8 % (ref 0.0–14.0)
NEUT%: 60.3 % (ref 38.4–76.8)
NEUTROS ABS: 2.9 10*3/uL (ref 1.5–6.5)
Platelets: 157 10*3/uL (ref 145–400)
RBC: 4.39 10*6/uL (ref 3.70–5.45)
RDW: 13.2 % (ref 11.2–14.5)
WBC: 4.8 10*3/uL (ref 3.9–10.3)

## 2015-02-28 NOTE — Progress Notes (Signed)
REFERRING PROVIDER: Evlyn Clines, MD  PRIMARY PROVIDER:  Shirline Frees, MD  PRIMARY REASON FOR VISIT:  1. Epithelial ovarian cancer, FIGO stage IVA ([place])   2. Family history of breast cancer in mother   51. Family history of leukemia   4. Family history of brain tumor   5. Family history of cancer   6. Ashkenazi Jewish ancestry   7. Elevated cancer antigen 125 (CA-125)      HISTORY OF PRESENT ILLNESS:   Elizabeth Henry, a 44 y.o. female, was seen for a [facility] cancer genetics consultation at the request of Dr. Marko Plume due to a personal history of ovarian cancer and maternal family history of breast cancer.  Elizabeth Henry presents to clinic today with her daughter, Elizabeth Henry, to discuss the possibility of a hereditary predisposition to cancer, genetic testing, and to further clarify her future cancer risks, as well as potential cancer risks for family members.   In 2016, at the age of 79, Elizabeth Henry was diagnosed with serous epithelial cancer of the ovary. Surgery date will likely be sometime in December, since Elizabeth Henry has a blood clot that is being treated in the meantime.    CANCER HISTORY:   No history exists.     HORMONAL RISK FACTORS:  Menarche was at age 65.  First live birth at age 27.  OCP use for approximately 6 months   Ovaries intact: yes.  Hysterectomy: no.  Menopausal status: premenopausal.  HRT use: 0 years. Colonoscopy: no; not examined. Mammogram within the last year: yes. Number of breast biopsies: 0. Up to date with pelvic exams:  yes. Any excessive radiation exposure in the past:  no  Past Medical History  Diagnosis Date  . Anxiety   . Allergy   . Migraines   . GERD (gastroesophageal reflux disease)     History reviewed. No pertinent past surgical history.  Social History   Social History  . Marital Status: Married    Spouse Name: N/A  . Number of Children: 2  . Years of Education: 16   Occupational History  . Self-employed     Social History Main Topics  . Smoking status: Former Smoker -- 1.00 packs/day for 10 years    Types: Cigarettes    Quit date: 10/18/2002  . Smokeless tobacco: Never Used     Comment: smoked on and off for 10 yrs total - up to 1 ppd at most  . Alcohol Use: No     Comment: very rarely  . Drug Use: No  . Sexual Activity: Not Currently   Other Topics Concern  . None   Social History Narrative   Fun: Go to [employer]; crochet   Denies religious beliefs effecting health care.      FAMILY HISTORY:  We obtained a detailed, 4-generation family history.  Significant diagnoses are listed below: Family History  Problem Relation Age of Onset  . Breast cancer Mother 25  . Healthy Father   . Breast cancer Maternal Grandmother     dx. 5 or younger  . Brain cancer Paternal Grandmother     unspecified tumor type  . HIV Brother   . Leukemia Paternal Uncle   . HIV Brother   . Alzheimer's disease Maternal Grandfather   . Diabetes Maternal Grandfather   . Breast cancer Other   . Cancer Paternal Uncle     unspecified type    Elizabeth Henry has two daughters (half-sisters to each other) who are currently 42 and 74.  She currently has two grandsons and one granddaughter.  She has two full brothers who are both in their late 61s, have no children of their own, and have never been diagnosed with cancer.  Elizabeth Henry mother was diagnosed with breast cancer at the age of 93 and passed away at 74.  Her father is currently 60 and has never had cancer.  Elizabeth Henry mother had one full sister, currently in her 10s.  Elizabeth Henry has limited information for this aunt, but reports that she has two daughters and one son.  Elizabeth Henry maternal grandmother died of breast cancer at 27; she is unsure at what age she was first diagnosed.  She has no information for any siblings for this grandmother, but does report that her great grandmother also had breast cancer and passed away later in life.  Elizabeth Henry.  Henry maternal grandfather died in his 30s-70s with alzheimer's and diabetes, but no cancer.    Elizabeth Henry's father had three full brothers.  One died at 31 and had leukemia.  He had one son and one daughter, neither of whom have had cancer.  Another is currently 53 and has no children.  The third paternal uncle died in his early 7s.  He was a smoker, and Elizabeth Henry believes he had an unspecified type of cancer.  He also had one son and one daughter who have not had cancer.  Elizabeth Henry paternal grandmother died of a brain tumor in her 43s-70s.  Elizabeth Henry paternal grandfather died in the war while in his 68s.  She has no further information for any paternal great aunts/uncles or great grandparents.  Patient's maternal ancestors are of [place] and [place] Jewish descent, and paternal ancestors are of [place] and European descent. Based on this information, there is likely Ashkenazi Jewish ancestry on the maternal side of the family. There is no known consanguinity.  GENETIC COUNSELING ASSESSMENT: Elizabeth Henry is a 44 y.o. female with a personal and family history of cancer which issomewhat suggestive of a hereditary breast and ovarian cancer syndrome and predisposition to cancer. We, therefore, discussed and recommended the following at today's visit.   DISCUSSION: We reviewed the characteristics, features and inheritance patterns of hereditary cancer syndromes, particularly those caused by mutations within the BRCA1/2 and Lynch syndrome genes. We also discussed genetic testing, including the appropriate family members to test, the process of testing, insurance coverage and turn-around-time for results. We discussed the implications of a negative, positive and/or variant of uncertain significant result. We recommended Elizabeth Henry pursue genetic testing for the 24-gene OvaNext Panel through [facility] ([place], [place]).  The OvaNext Panel offered by [facility] includes sequencing  and deletion/duplication analysis for the following 23 genes: ATM, BARD1, BRCA1, BRCA2, BRIP1, CDH1, CHEK2, MLH1, MRE11A, MSH2, MSH6, MUTYH, NBN, NF1, PALB2, PMS2, PTEN, RAD50, RAD51C, RAD51D, SMARCA4, STK11, and TP53.  This panel also includes deletion/duplication analysis (without sequencing) for one gene, EPCAM.   Based on Elizabeth Henry's personal and family history of cancer, she meets medical criteria for genetic testing. Despite that she meets criteria, she may still have an out of pocket cost. We discussed that if her out of pocket cost for testing is over $100, the laboratory will call and confirm whether she wants to proceed with testing.  If the out of pocket cost of testing is less than $100 she will be billed by the genetic testing laboratory.   Based on the patient's personal and family history, a statistical  model (Tyrer-Cuzick risk model)  and literature data were used to estimate her risk of developing breast cancer. This estimates her lifetime risk of developing breast cancer to be approximately 55.8%. This estimation does not take into account any genetic testing results.  The patient's lifetime breast cancer risk is a preliminary estimate based on available information using one of several models endorsed by the [place] (ACS). The ACS recommends consideration of breast MRI screening as an adjunct to mammography for patients at high risk (defined as 20% or greater lifetime risk). A more detailed breast cancer risk assessment can be considered, if clinically indicated.   PLAN: After considering the risks, benefits, and limitations, Elizabeth Henry. Kuehl  provided informed consent to pursue genetic testing and the blood sample was sent to [facility] for analysis of the 24-gene OvaNext Panel test. Results should be available within approximately 3-4 weeks' time, at which point they will be disclosed by telephone to Elizabeth Henry. Sorci, as will any additional recommendations  warranted by these results. Elizabeth Henry. Madruga will receive a summary of her genetic counseling visit and a copy of her results once available. This information will also be available in Epic. We encouraged Elizabeth Henry. Mancinelli to remain in contact with cancer genetics annually so that we can continuously update the family history and inform her of any changes in cancer genetics and testing that may be of benefit for her family. Elizabeth Henry. Nadal questions were answered to her satisfaction today. Our contact information was provided should additional questions or concerns arise.  Thank you for the referral and allowing [place] to share in the care of your patient.   Jeanine Luz, Elizabeth Henry Genetic Counselor [email or web address] Phone: [phone]  The patient was seen for a total of 60 minutes in face-to-face genetic counseling.  This patient was discussed with Drs. Magrinat, Lindi Adie and/or Burr Medico who agrees with the above.    _______________________________________________________________________ For Office Staff:  Number of people involved in session: 2 Was an Intern/ student involved with case: no

## 2015-03-01 LAB — CA 125: CA 125: 3682 U/mL — AB (ref <35)

## 2015-03-03 ENCOUNTER — Other Ambulatory Visit: Payer: Self-pay | Admitting: Oncology

## 2015-03-03 DIAGNOSIS — C569 Malignant neoplasm of unspecified ovary: Secondary | ICD-10-CM

## 2015-03-04 ENCOUNTER — Other Ambulatory Visit: Payer: 59

## 2015-03-04 ENCOUNTER — Telehealth: Payer: Self-pay

## 2015-03-04 ENCOUNTER — Encounter: Payer: Self-pay | Admitting: Oncology

## 2015-03-04 ENCOUNTER — Telehealth: Payer: Self-pay | Admitting: Oncology

## 2015-03-04 ENCOUNTER — Ambulatory Visit (HOSPITAL_BASED_OUTPATIENT_CLINIC_OR_DEPARTMENT_OTHER): Payer: 59

## 2015-03-04 ENCOUNTER — Ambulatory Visit (HOSPITAL_BASED_OUTPATIENT_CLINIC_OR_DEPARTMENT_OTHER): Payer: 59 | Admitting: Oncology

## 2015-03-04 VITALS — BP 129/87 | HR 97 | Temp 98.0°F | Resp 18 | Ht 63.0 in | Wt 147.6 lb

## 2015-03-04 DIAGNOSIS — R180 Malignant ascites: Secondary | ICD-10-CM

## 2015-03-04 DIAGNOSIS — C569 Malignant neoplasm of unspecified ovary: Secondary | ICD-10-CM

## 2015-03-04 DIAGNOSIS — N1330 Unspecified hydronephrosis: Secondary | ICD-10-CM | POA: Diagnosis not present

## 2015-03-04 DIAGNOSIS — Z7901 Long term (current) use of anticoagulants: Secondary | ICD-10-CM

## 2015-03-04 DIAGNOSIS — C801 Malignant (primary) neoplasm, unspecified: Secondary | ICD-10-CM | POA: Diagnosis not present

## 2015-03-04 DIAGNOSIS — F418 Other specified anxiety disorders: Secondary | ICD-10-CM | POA: Diagnosis not present

## 2015-03-04 DIAGNOSIS — J910 Malignant pleural effusion: Secondary | ICD-10-CM

## 2015-03-04 DIAGNOSIS — I2699 Other pulmonary embolism without acute cor pulmonale: Secondary | ICD-10-CM

## 2015-03-04 DIAGNOSIS — Z803 Family history of malignant neoplasm of breast: Secondary | ICD-10-CM

## 2015-03-04 LAB — CBC WITH DIFFERENTIAL/PLATELET
BASO%: 0.7 % (ref 0.0–2.0)
BASOS ABS: 0 10*3/uL (ref 0.0–0.1)
EOS ABS: 0.1 10*3/uL (ref 0.0–0.5)
EOS%: 1.8 % (ref 0.0–7.0)
HEMATOCRIT: 34.1 % — AB (ref 34.8–46.6)
HEMOGLOBIN: 11.2 g/dL — AB (ref 11.6–15.9)
LYMPH#: 1.2 10*3/uL (ref 0.9–3.3)
LYMPH%: 29.6 % (ref 14.0–49.7)
MCH: 28.4 pg (ref 25.1–34.0)
MCHC: 32.7 g/dL (ref 31.5–36.0)
MCV: 86.6 fL (ref 79.5–101.0)
MONO#: 0.3 10*3/uL (ref 0.1–0.9)
MONO%: 8.6 % (ref 0.0–14.0)
NEUT#: 2.4 10*3/uL (ref 1.5–6.5)
NEUT%: 59.3 % (ref 38.4–76.8)
PLATELETS: 166 10*3/uL (ref 145–400)
RBC: 3.94 10*6/uL (ref 3.70–5.45)
RDW: 13.3 % (ref 11.2–14.5)
WBC: 4.1 10*3/uL (ref 3.9–10.3)

## 2015-03-04 NOTE — Telephone Encounter (Signed)
Appointments made and avs printed for patient,note to gyn onc for her 11/28 or so appointment

## 2015-03-04 NOTE — Telephone Encounter (Signed)
Told Elizabeth Henry that her ANC was 2.4 and plts were 166 k today.  These are fine for her treatment tomorrow.  She can go ahead and take the decadron premed for taxol.  Elizabeth Henry verbalized understanding.

## 2015-03-04 NOTE — Progress Notes (Signed)
Per Elizabeth Henry we can't get another prior auth till after 11/1. Patient just got filled and they can't fill till after 11/1-- call ref# [id number]. He said it was just filled 02/26/15
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
## 2015-03-05 ENCOUNTER — Ambulatory Visit (HOSPITAL_BASED_OUTPATIENT_CLINIC_OR_DEPARTMENT_OTHER): Payer: 59

## 2015-03-05 ENCOUNTER — Ambulatory Visit: Payer: 59 | Admitting: Nutrition

## 2015-03-05 ENCOUNTER — Other Ambulatory Visit: Payer: Self-pay | Admitting: Oncology

## 2015-03-05 ENCOUNTER — Other Ambulatory Visit: Payer: 59

## 2015-03-05 VITALS — BP 125/78 | HR 108 | Temp 98.1°F | Resp 18

## 2015-03-05 DIAGNOSIS — Z5111 Encounter for antineoplastic chemotherapy: Secondary | ICD-10-CM | POA: Diagnosis not present

## 2015-03-05 DIAGNOSIS — C569 Malignant neoplasm of unspecified ovary: Secondary | ICD-10-CM | POA: Diagnosis not present

## 2015-03-05 MED ORDER — PACLITAXEL CHEMO INJECTION 300 MG/50ML
175.0000 mg/m2 | Freq: Once | INTRAVENOUS | Status: AC
  Administered 2015-03-05: 318 mg via INTRAVENOUS
  Filled 2015-03-05: qty 53

## 2015-03-05 MED ORDER — DIPHENHYDRAMINE HCL 50 MG/ML IJ SOLN
50.0000 mg | Freq: Once | INTRAMUSCULAR | Status: AC
  Administered 2015-03-05: 50 mg via INTRAVENOUS

## 2015-03-05 MED ORDER — FAMOTIDINE IN NACL 20-0.9 MG/50ML-% IV SOLN
INTRAVENOUS | Status: AC
  Filled 2015-03-05: qty 50

## 2015-03-05 MED ORDER — SODIUM CHLORIDE 0.9 % IV SOLN
Freq: Once | INTRAVENOUS | Status: AC
  Administered 2015-03-05: 12:00:00 via INTRAVENOUS
  Filled 2015-03-05: qty 8

## 2015-03-05 MED ORDER — SODIUM CHLORIDE 0.9 % IV SOLN
Freq: Once | INTRAVENOUS | Status: AC
  Administered 2015-03-05: 12:00:00 via INTRAVENOUS
  Filled 2015-03-05: qty 5

## 2015-03-05 MED ORDER — SODIUM CHLORIDE 0.9 % IV SOLN
Freq: Once | INTRAVENOUS | Status: AC
  Administered 2015-03-05: 11:00:00 via INTRAVENOUS

## 2015-03-05 MED ORDER — DIPHENHYDRAMINE HCL 50 MG/ML IJ SOLN
INTRAMUSCULAR | Status: AC
  Filled 2015-03-05: qty 1

## 2015-03-05 MED ORDER — FAMOTIDINE IN NACL 20-0.9 MG/50ML-% IV SOLN
20.0000 mg | Freq: Once | INTRAVENOUS | Status: AC
  Administered 2015-03-05: 20 mg via INTRAVENOUS

## 2015-03-05 MED ORDER — SODIUM CHLORIDE 0.9 % IV SOLN
711.6000 mg | Freq: Once | INTRAVENOUS | Status: AC
  Administered 2015-03-05: 710 mg via INTRAVENOUS
  Filled 2015-03-05: qty 71

## 2015-03-05 NOTE — Patient Instructions (Signed)
[facility] Discharge Instructions for Patients Receiving Chemotherapy  Today you received the following chemotherapy agents Paclitaxel/Carboplatin.   To help prevent nausea and vomiting after your treatment, we encourage you to take your nausea medication as directed.    If you develop nausea and vomiting that is not controlled by your nausea medication, call the clinic.   BELOW ARE SYMPTOMS THAT SHOULD BE REPORTED IMMEDIATELY:  *FEVER GREATER THAN 100.5 F  *CHILLS WITH OR WITHOUT FEVER  NAUSEA AND VOMITING THAT IS NOT CONTROLLED WITH YOUR NAUSEA MEDICATION  *UNUSUAL SHORTNESS OF BREATH  *UNUSUAL BRUISING OR BLEEDING  TENDERNESS IN MOUTH AND THROAT WITH OR WITHOUT PRESENCE OF ULCERS  *URINARY PROBLEMS  *BOWEL PROBLEMS  UNUSUAL RASH Items with * indicate a potential emergency and should be followed up as soon as possible.  Feel free to call the clinic you have any questions or concerns. The clinic phone number is [phone].  Please show the CHEMO ALERT CARD at check-in to the Emergency Department and triage nurse.   

## 2015-03-05 NOTE — Progress Notes (Signed)
Nutrition follow-up completed with patient receiving chemotherapy for ovarian cancer. Weight has decreased and was documented as 147.6 pounds October 31, down from 163.7 pounds.  Is likely due from a decrease in fluid/ascites. Patient reports she feels better and is able to eat a little bit more. Patient has many questions today about appropriate foods to be consuming. States friends and family are giving nutrition advice to her and she would like to ask me questions.  Nutrition diagnosis: Food and nutrition related knowledge deficit continues.  Intervention:  Educated patient on the importance of adequate protein, whether that's from animal foods or plant-based foods and provided a fact sheet. Educated patient on foods to consume and foods to avoid if she is experiencing nausea and vomiting and provided fact sheet. Educated patient about "sugar and cancer" and provided fact sheet. Provided a fact sheet on appropriate websites and books on evidence-based nutrition and cancer. Also provided patient with healthy eating cookbook. Questions were answered.  Teach back method used.  Monitoring, evaluation, goals: Patient will tolerate adequate calories and protein to promote maintenance of lean body mass.  Next visit: Tuesday, November 22, during infusion.  **Disclaimer: This note was dictated with voice recognition software. Similar sounding words can inadvertently be transcribed and this note may contain transcription errors which may not have been corrected upon publication of note.**

## 2015-03-07 ENCOUNTER — Other Ambulatory Visit: Payer: Self-pay | Admitting: Oncology

## 2015-03-07 DIAGNOSIS — C569 Malignant neoplasm of unspecified ovary: Secondary | ICD-10-CM

## 2015-03-08 ENCOUNTER — Ambulatory Visit (HOSPITAL_BASED_OUTPATIENT_CLINIC_OR_DEPARTMENT_OTHER): Payer: 59

## 2015-03-08 VITALS — BP 106/67 | HR 85 | Temp 98.0°F | Resp 18

## 2015-03-08 DIAGNOSIS — C569 Malignant neoplasm of unspecified ovary: Secondary | ICD-10-CM

## 2015-03-08 DIAGNOSIS — E860 Dehydration: Secondary | ICD-10-CM | POA: Diagnosis not present

## 2015-03-08 MED ORDER — SODIUM CHLORIDE 0.9 % IV SOLN
INTRAVENOUS | Status: DC
  Administered 2015-03-08: 10:00:00 via INTRAVENOUS

## 2015-03-08 MED ORDER — SODIUM CHLORIDE 0.9 % IV SOLN
Freq: Once | INTRAVENOUS | Status: AC
  Administered 2015-03-08: 10:00:00 via INTRAVENOUS
  Filled 2015-03-08: qty 4

## 2015-03-08 NOTE — Patient Instructions (Signed)

## 2015-03-10 ENCOUNTER — Other Ambulatory Visit: Payer: Self-pay | Admitting: Oncology

## 2015-03-11 ENCOUNTER — Ambulatory Visit (HOSPITAL_BASED_OUTPATIENT_CLINIC_OR_DEPARTMENT_OTHER): Payer: 59 | Admitting: Oncology

## 2015-03-11 ENCOUNTER — Telehealth: Payer: Self-pay | Admitting: Oncology

## 2015-03-11 ENCOUNTER — Other Ambulatory Visit (HOSPITAL_BASED_OUTPATIENT_CLINIC_OR_DEPARTMENT_OTHER): Payer: 59

## 2015-03-11 ENCOUNTER — Encounter: Payer: Self-pay | Admitting: Oncology

## 2015-03-11 ENCOUNTER — Ambulatory Visit (HOSPITAL_BASED_OUTPATIENT_CLINIC_OR_DEPARTMENT_OTHER): Payer: 59

## 2015-03-11 VITALS — BP 134/91 | HR 99 | Temp 98.0°F | Resp 18 | Ht 63.0 in | Wt 148.1 lb

## 2015-03-11 DIAGNOSIS — C569 Malignant neoplasm of unspecified ovary: Secondary | ICD-10-CM

## 2015-03-11 DIAGNOSIS — C801 Malignant (primary) neoplasm, unspecified: Secondary | ICD-10-CM | POA: Diagnosis not present

## 2015-03-11 DIAGNOSIS — Z5189 Encounter for other specified aftercare: Secondary | ICD-10-CM

## 2015-03-11 DIAGNOSIS — N1330 Unspecified hydronephrosis: Secondary | ICD-10-CM

## 2015-03-11 DIAGNOSIS — Z7901 Long term (current) use of anticoagulants: Secondary | ICD-10-CM

## 2015-03-11 DIAGNOSIS — D701 Agranulocytosis secondary to cancer chemotherapy: Secondary | ICD-10-CM

## 2015-03-11 DIAGNOSIS — T451X5A Adverse effect of antineoplastic and immunosuppressive drugs, initial encounter: Secondary | ICD-10-CM

## 2015-03-11 DIAGNOSIS — K5909 Other constipation: Secondary | ICD-10-CM

## 2015-03-11 DIAGNOSIS — F418 Other specified anxiety disorders: Secondary | ICD-10-CM

## 2015-03-11 DIAGNOSIS — I2699 Other pulmonary embolism without acute cor pulmonale: Secondary | ICD-10-CM | POA: Diagnosis not present

## 2015-03-11 DIAGNOSIS — R180 Malignant ascites: Secondary | ICD-10-CM

## 2015-03-11 LAB — COMPREHENSIVE METABOLIC PANEL (CC13)
ALBUMIN: 3.8 g/dL (ref 3.5–5.0)
ALK PHOS: 91 U/L (ref 40–150)
ALT: 45 U/L (ref 0–55)
AST: 25 U/L (ref 5–34)
Anion Gap: 9 mEq/L (ref 3–11)
BUN: 13.1 mg/dL (ref 7.0–26.0)
CHLORIDE: 102 meq/L (ref 98–109)
CO2: 26 mEq/L (ref 22–29)
Calcium: 9.2 mg/dL (ref 8.4–10.4)
Creatinine: 0.8 mg/dL (ref 0.6–1.1)
EGFR: 88 mL/min/{1.73_m2} — AB (ref >90)
GLUCOSE: 85 mg/dL (ref 70–140)
POTASSIUM: 4 meq/L (ref 3.5–5.1)
SODIUM: 137 meq/L (ref 136–145)
Total Bilirubin: 0.37 mg/dL (ref 0.20–1.20)
Total Protein: 6.5 g/dL (ref 6.4–8.3)

## 2015-03-11 LAB — CBC WITH DIFFERENTIAL/PLATELET
BASO%: 0.4 % (ref 0.0–2.0)
BASOS ABS: 0 10*3/uL (ref 0.0–0.1)
EOS ABS: 0.1 10*3/uL (ref 0.0–0.5)
EOS%: 5.4 % (ref 0.0–7.0)
HCT: 26 % — ABNORMAL LOW (ref 34.8–46.6)
HEMOGLOBIN: 8.4 g/dL — AB (ref 11.6–15.9)
LYMPH%: 51.7 % — AB (ref 14.0–49.7)
MCH: 28.3 pg (ref 25.1–34.0)
MCHC: 32.3 g/dL (ref 31.5–36.0)
MCV: 87.5 fL (ref 79.5–101.0)
MONO#: 0 10*3/uL — ABNORMAL LOW (ref 0.1–0.9)
MONO%: 1.5 % (ref 0.0–14.0)
NEUT#: 1.1 10*3/uL — ABNORMAL LOW (ref 1.5–6.5)
NEUT%: 41 % (ref 38.4–76.8)
Platelets: 169 10*3/uL (ref 145–400)
RBC: 2.97 10*6/uL — ABNORMAL LOW (ref 3.70–5.45)
RDW: 13.6 % (ref 11.2–14.5)
WBC: 2.6 10*3/uL — ABNORMAL LOW (ref 3.9–10.3)
lymph#: 1.4 10*3/uL (ref 0.9–3.3)

## 2015-03-11 MED ORDER — PROCHLORPERAZINE MALEATE 10 MG PO TABS: 10.0000 mg | ORAL_TABLET | Freq: Four times a day (QID) | ORAL | Status: DC | PRN

## 2015-03-11 MED ORDER — TBO-FILGRASTIM 300 MCG/0.5ML ~~LOC~~ SOSY
300.0000 ug | PREFILLED_SYRINGE | Freq: Once | SUBCUTANEOUS | Status: AC
  Administered 2015-03-11: 300 ug via SUBCUTANEOUS
  Filled 2015-03-11: qty 0.5
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
## 2015-03-11 NOTE — Telephone Encounter (Signed)
Appointments made and avs will be given at 11/8

## 2015-03-11 NOTE — Patient Instructions (Signed)

## 2015-03-12 ENCOUNTER — Telehealth: Payer: Self-pay | Admitting: Gynecologic Oncology

## 2015-03-12 ENCOUNTER — Ambulatory Visit (HOSPITAL_BASED_OUTPATIENT_CLINIC_OR_DEPARTMENT_OTHER): Payer: 59

## 2015-03-12 VITALS — BP 126/83 | HR 105 | Temp 98.8°F

## 2015-03-12 DIAGNOSIS — C569 Malignant neoplasm of unspecified ovary: Secondary | ICD-10-CM | POA: Diagnosis not present

## 2015-03-12 DIAGNOSIS — Z5189 Encounter for other specified aftercare: Secondary | ICD-10-CM

## 2015-03-12 MED ORDER — TBO-FILGRASTIM 300 MCG/0.5ML ~~LOC~~ SOSY
300.0000 ug | PREFILLED_SYRINGE | Freq: Once | SUBCUTANEOUS | Status: AC
  Administered 2015-03-12: 300 ug via SUBCUTANEOUS
  Filled 2015-03-12: qty 0.5

## 2015-03-12 NOTE — Telephone Encounter (Signed)
Error

## 2015-03-13 ENCOUNTER — Other Ambulatory Visit: Payer: Self-pay | Admitting: Oncology

## 2015-03-13 DIAGNOSIS — C569 Malignant neoplasm of unspecified ovary: Secondary | ICD-10-CM

## 2015-03-14 ENCOUNTER — Ambulatory Visit (HOSPITAL_BASED_OUTPATIENT_CLINIC_OR_DEPARTMENT_OTHER): Payer: 59 | Admitting: Oncology

## 2015-03-14 ENCOUNTER — Encounter: Payer: Self-pay | Admitting: Oncology

## 2015-03-14 ENCOUNTER — Other Ambulatory Visit (HOSPITAL_BASED_OUTPATIENT_CLINIC_OR_DEPARTMENT_OTHER): Payer: 59

## 2015-03-14 ENCOUNTER — Ambulatory Visit: Payer: 59

## 2015-03-14 ENCOUNTER — Telehealth: Payer: Self-pay | Admitting: Oncology

## 2015-03-14 ENCOUNTER — Ambulatory Visit (HOSPITAL_COMMUNITY)
Admission: RE | Admit: 2015-03-14 | Discharge: 2015-03-14 | Disposition: A | Discharge status: Home / Self Care | Payer: 59 | Source: Ambulatory Visit | Attending: Oncology | Admitting: Oncology

## 2015-03-14 VITALS — BP 119/72 | HR 88 | Temp 97.9°F | Resp 18 | Ht 63.0 in | Wt 150.2 lb

## 2015-03-14 DIAGNOSIS — D6481 Anemia due to antineoplastic chemotherapy: Secondary | ICD-10-CM

## 2015-03-14 DIAGNOSIS — D649 Anemia, unspecified: Secondary | ICD-10-CM | POA: Diagnosis not present

## 2015-03-14 DIAGNOSIS — R180 Malignant ascites: Secondary | ICD-10-CM

## 2015-03-14 DIAGNOSIS — C569 Malignant neoplasm of unspecified ovary: Secondary | ICD-10-CM

## 2015-03-14 DIAGNOSIS — I2699 Other pulmonary embolism without acute cor pulmonale: Secondary | ICD-10-CM

## 2015-03-14 DIAGNOSIS — D701 Agranulocytosis secondary to cancer chemotherapy: Secondary | ICD-10-CM

## 2015-03-14 DIAGNOSIS — C801 Malignant (primary) neoplasm, unspecified: Secondary | ICD-10-CM

## 2015-03-14 DIAGNOSIS — N1330 Unspecified hydronephrosis: Secondary | ICD-10-CM

## 2015-03-14 DIAGNOSIS — K5909 Other constipation: Secondary | ICD-10-CM

## 2015-03-14 DIAGNOSIS — R188 Other ascites: Secondary | ICD-10-CM

## 2015-03-14 DIAGNOSIS — Z7901 Long term (current) use of anticoagulants: Secondary | ICD-10-CM

## 2015-03-14 DIAGNOSIS — T451X5A Adverse effect of antineoplastic and immunosuppressive drugs, initial encounter: Secondary | ICD-10-CM

## 2015-03-14 LAB — COMPREHENSIVE METABOLIC PANEL (CC13)
ALT: 50 U/L (ref 0–55)
ANION GAP: 7 meq/L (ref 3–11)
AST: 23 U/L (ref 5–34)
Albumin: 3.5 g/dL (ref 3.5–5.0)
Alkaline Phosphatase: 96 U/L (ref 40–150)
BUN: 8.5 mg/dL (ref 7.0–26.0)
CHLORIDE: 104 meq/L (ref 98–109)
CO2: 27 meq/L (ref 22–29)
Calcium: 8.9 mg/dL (ref 8.4–10.4)
Creatinine: 0.8 mg/dL (ref 0.6–1.1)
GLUCOSE: 88 mg/dL (ref 70–140)
POTASSIUM: 3.6 meq/L (ref 3.5–5.1)
SODIUM: 138 meq/L (ref 136–145)
Total Protein: 6.1 g/dL — ABNORMAL LOW (ref 6.4–8.3)

## 2015-03-14 LAB — CBC WITH DIFFERENTIAL/PLATELET
BASO%: 0.5 % (ref 0.0–2.0)
BASOS ABS: 0 10*3/uL (ref 0.0–0.1)
EOS ABS: 0.1 10*3/uL (ref 0.0–0.5)
EOS%: 1.6 % (ref 0.0–7.0)
HCT: 23.3 % — ABNORMAL LOW (ref 34.8–46.6)
HGB: 7.5 g/dL — ABNORMAL LOW (ref 11.6–15.9)
LYMPH%: 45.8 % (ref 14.0–49.7)
MCH: 28.4 pg (ref 25.1–34.0)
MCHC: 32.2 g/dL (ref 31.5–36.0)
MCV: 88.3 fL (ref 79.5–101.0)
MONO#: 0.4 10*3/uL (ref 0.1–0.9)
MONO%: 9.7 % (ref 0.0–14.0)
NEUT%: 42.4 % (ref 38.4–76.8)
NEUTROS ABS: 1.6 10*3/uL (ref 1.5–6.5)
PLATELETS: 174 10*3/uL (ref 145–400)
RBC: 2.64 10*6/uL — AB (ref 3.70–5.45)
RDW: 13.8 % (ref 11.2–14.5)
WBC: 3.7 10*3/uL — AB (ref 3.9–10.3)
lymph#: 1.7 10*3/uL (ref 0.9–3.3)

## 2015-03-14 LAB — ABO/RH: ABO/RH(D): A POS

## 2015-03-14 LAB — HOLD TUBE, BLOOD BANK
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
## 2015-03-14 NOTE — Telephone Encounter (Signed)
Appointments added per pof

## 2015-03-15 ENCOUNTER — Ambulatory Visit (HOSPITAL_COMMUNITY)
Admission: RE | Admit: 2015-03-15 | Discharge: 2015-03-15 | Disposition: A | Discharge status: Home / Self Care | Payer: 59 | Source: Ambulatory Visit | Attending: Oncology | Admitting: Oncology

## 2015-03-15 ENCOUNTER — Other Ambulatory Visit: Payer: Self-pay | Admitting: Oncology

## 2015-03-15 ENCOUNTER — Ambulatory Visit (HOSPITAL_BASED_OUTPATIENT_CLINIC_OR_DEPARTMENT_OTHER): Payer: 59

## 2015-03-15 VITALS — BP 102/67 | HR 67 | Temp 98.7°F | Resp 16

## 2015-03-15 DIAGNOSIS — R180 Malignant ascites: Secondary | ICD-10-CM

## 2015-03-15 DIAGNOSIS — C569 Malignant neoplasm of unspecified ovary: Secondary | ICD-10-CM | POA: Insufficient documentation

## 2015-03-15 DIAGNOSIS — T451X5A Adverse effect of antineoplastic and immunosuppressive drugs, initial encounter: Secondary | ICD-10-CM

## 2015-03-15 DIAGNOSIS — D6481 Anemia due to antineoplastic chemotherapy: Secondary | ICD-10-CM | POA: Insufficient documentation

## 2015-03-15 LAB — PREPARE RBC (CROSSMATCH)

## 2015-03-15 MED ORDER — SODIUM CHLORIDE 0.9 % IV SOLN
250.0000 mL | Freq: Once | INTRAVENOUS | Status: AC
  Administered 2015-03-15: 250 mL via INTRAVENOUS

## 2015-03-15 MED ORDER — ACETAMINOPHEN 325 MG PO TABS
325.0000 mg | ORAL_TABLET | Freq: Once | ORAL | Status: AC
Start: 2015-03-15 — End: 2015-03-15
  Administered 2015-03-15: 325 mg via ORAL

## 2015-03-15 MED ORDER — ACETAMINOPHEN 325 MG PO TABS
ORAL_TABLET | ORAL | Status: AC
  Filled 2015-03-15: qty 1

## 2015-03-15 NOTE — Patient Instructions (Signed)

## 2015-03-17 LAB — TYPE AND SCREEN
ABO/RH(D): A POS
Antibody Screen: NEGATIVE
UNIT DIVISION: 0
UNIT DIVISION: 0

## 2015-03-18 ENCOUNTER — Telehealth: Payer: Self-pay | Admitting: Genetic Counselor

## 2015-03-18 ENCOUNTER — Ambulatory Visit: Payer: Self-pay | Admitting: Genetic Counselor

## 2015-03-18 DIAGNOSIS — Z1379 Encounter for other screening for genetic and chromosomal anomalies: Secondary | ICD-10-CM

## 2015-03-18 DIAGNOSIS — Z1501 Genetic susceptibility to malignant neoplasm of breast: Secondary | ICD-10-CM

## 2015-03-18 DIAGNOSIS — Z1509 Genetic susceptibility to other malignant neoplasm: Secondary | ICD-10-CM

## 2015-03-18 NOTE — Telephone Encounter (Signed)
Discussed that Ms. Elizabeth Henry's genetic test result showed that she has a pathogenic BRCA1 mutation.  This explains Ms. Elizabeth Henry's ovarian cancer and likely the maternal family history of breast cancer.  We briefly discussed that this causes her to be at a high risk for ovarian and breast cancer.  Ms. Elizabeth Henry has not yet had her BSO surgery because she has a blood clot that they are waiting to resolve first.  We discussed that this surgery is a priority as are additional breast cancer screening options or consideration of a risk-reducing double mastectomy based on the high breast cancer risk.  We discussed that Ms. Elizabeth Henry's daughters each have a 50% chance of having inherited this same mutation.  They are of the age now that they can have breast cancer screening, so now is a good time for them to have genetic counseling and testing.  Ms. Elizabeth Henry's brothers are also at a 50% chance of having inherited this mutation; we discussed the increased risk for female breast cancer and prostate cancer for men who have BRCA1 mutations.   We discussed that this is most likely coming from mom's side of the family based on the family history of breast cancer on that side.  Ms. Elizabeth Henry mother has passed away, so the best way to know for sure is to test Ms. Elizabeth Henry's dad first (to rule out risks for paternal relatives).  We discussed that there is a $50 genetic testing option for relatives of patients who have tested positive.  We discussed the risks to Ms. Elizabeth Henry's maternal aunt as well.  Ms. Elizabeth Henry would like to schedule a follow-up visit to discuss this result, her cancer risks, and screening recommendations in more detail.  With the holidays coming up and several other doctors appointments, she is not sure when would be a good time to do this.  She would really like for her husband and daughter to be able to attend with her.  She will speak with her husband and see when would be a good time to come in.  She mentioned  that she would still like to get a letter and copy of the test result in the meantime, so that she can share with her daughters especially.  She and a friend will be here at the [facility] on Thursday and will stop by and pick this letter up.  I will leave it with Hassan Rowan at the front desk in our Support Services department if I am in with a patient.  We discussed the [employer] website ([email or web address]) for family members to use to find a cancer Dietitian in their area.  I will include this information and more in my letter.

## 2015-03-20 ENCOUNTER — Encounter: Payer: Self-pay | Admitting: Genetic Counselor

## 2015-03-20 ENCOUNTER — Other Ambulatory Visit: Payer: Self-pay | Admitting: Oncology

## 2015-03-20 DIAGNOSIS — Z1509 Genetic susceptibility to other malignant neoplasm: Secondary | ICD-10-CM

## 2015-03-20 DIAGNOSIS — Z1501 Genetic susceptibility to malignant neoplasm of breast: Secondary | ICD-10-CM | POA: Insufficient documentation

## 2015-03-20 DIAGNOSIS — Z1379 Encounter for other screening for genetic and chromosomal anomalies: Secondary | ICD-10-CM | POA: Insufficient documentation

## 2015-03-20 NOTE — Progress Notes (Signed)
GENETIC TEST RESULTS   Patient Name: Elizabeth Henry Patient Age: 44 y.o. Encounter Date: 03/18/2015  Referring Provider: Evlyn Clines, MD Test Result: BRCA1 mutation positive   Ms. Langsam was seen in the [facility] clinic on February 28, 2015 due to a personal history of ovarian cancer, family history of breast and other cancers, and concern regarding a hereditary predisposition to cancer in the family. Please refer to the prior Genetics clinic note on 02/28/15 for more information regarding Ms. Gaza's medical and family histories and our assessment at the time.   GENETIC TESTING:  At the time of Ms. Pawelski's visit, we recommended she pursue genetic testing of the 24 genes on the OvaNext Panel through [employer].  The 24-gene OvaNext Panel performed by [facility], [place]) includes sequencing and deletion/duplication analysis of the following 23 genes: ATM, BARD1, BRCA1, BRCA2, BRIP1, CDH1, CHEK2, MLH1, MRE11A, MSH2, MSH6, MUTYH, NBN, NF1, PALB2, PMS2, PTEN, RAD50, RAD51C, RAD51D, SMARCA4, STK11, and TP53.  This panel also includes deletion/duplication analysis (without sequencing) for one gene, EPCAM.  Those results are now back, the date of report for which is March 18, 2015.  Testing revealed a pathogenic mutation called "c.2603C>G (p.S868*) in one copy of the BRCA1 gene.  MEDICAL MANAGEMENT: Women who have a BRCA1 mutation have an increased risk for both breast and ovarian cancer.   Ms. Setterlund will have a bilateral salpingo oophorectomy soon.  She is also at an increased risk for breast cancer.  We, therefore, discussed the recommendations below from the NCCN that are specific to women with a BRCA1 mutation. She will need to decide whether she will proceed with increased breast screenings vs. risk-reduction bilateral mastectomies.   NCCN Guidelines Version 1.2017 (BRCA Mutation-Positive Management): Breast Cancer:  - Starting at age 45:  Breast awareness - Women should be familiar with their breasts and promptly report changes to their healthcare provider. Performing regular breast self exams may help increase breast awareness, especially when checked at the end of the menstrual cycle.  - Starting at age 55: Clinical breast exam every 6-12 months. - Between ages 25-29 or individualized based on family history: Breast MRI screening (preferred) every year or mammogram if MRI is unavailable. May also be individualized based on family history if a breast cancer diagnosis before age 17 is present.  (Ms. Sanderford's mother was diagnosed with breast cancer at age 16.) - Between ages 12-75: Mammogram and breast MRI screening every year.  - Option of risk-reducing bilateral mastectomies -Age >75y: management should be considered on an individual basis.  Ovarian Cancer: - Recommend risk-reducing salpingo-oophorectomy (RRSO), typically between 29 and 13 y, and upon completion of child bearing.  - While there may be circumstances where ovarian cancer screening with transvaginal ultrasound and a blood test for a protein called CA-125 are helpful, these techniques have not been shown to be effective in detecting early ovarian cancer and are generally not recommended.  (Ms. Pittinger has already been diagnosed with ovarian cancer and will undergo a BSO as soon as her blood clot is resolved.)  - Consider risk reduction agents as options for breast and ovarian cancer, including discussing risks and benefits.  Other Cancers: We discussed that while literature has shown other cancers, such as pancreatic cancer and melanoma, to be associated with BRCA1 mutations, national guidelines do not currently recommend any specific screenings for these cancers. Screening may be individualized based on cancers observed in the family.  To Ms. Andreatta's knowledge, none of  her family members have been diagnosed with melanoma or pancreatic cancers.   FAMILY MEMBERS:  It is important that all of Ms. Karbowski's relatives (both men and women) know of the presence of this gene mutation. Site-specific genetic testing can sort out who in the family is at risk and who is not.    Female family members who are identified to carry BRCA1 mutations would be at an increased risk for female breast cancer and should begin self breast examinations and annual clinical breast examinations from a doctor at the age of 22.  Due to a slightly increased risk for prostate cancer, female family members with BRCA1 mutations should consider prostate cancer screening.     Ms. Tineo's children and brothers have a 50% chance to have inherited this mutation. We recommend they have genetic testing for this same mutation, as identifying the presence of this mutation would allow them to also take advantage of risk-reducing measures.  We discussed that Ms. Seiter's two daughters are in the age range when they would need to begin annual breast MRI or mammograms, so genetic testing will be helpful to determine the need for this screening at this age.  We also discussed that it would be helpful to test Ms. Lotito's father to rule out that this BRCA1 mutation has been inherited from him.  A negative genetic test for him would rule out increased cancer risks for paternal relatives and would verify that the BRCA1 mutation is in fact explaining the maternal family history of breast cancer.  Ms. Derden's maternal aunt will also need to have genetic testing to determine her BRCA1 mutation status.    SUPPORT AND RESOURCES: If Ms. Nickell is interested in BRCA-specific information and support, there are two groups, Facing Our Risk (www.facingourrisk.com) and Bright Pink (www.brightpink.org) which some people have found useful. They provide opportunities to speak with other individuals from high-risk families. To locate genetic counselors in other cities, visit the website of the [employer] (Artist[employer].se) and search for a Oncologist by zip code.  We encouraged Ms. Soloway to remain in contact with [place] on an annual basis so we can update her personal and family histories, and let her know of advances in cancer genetics that may benefit the family. Our contact number was provided. Ms. Espiritu questions were answered to her satisfaction today, and she knows she is welcome to call anytime with additional questions.    We also encourage Ms. Bluitt and her family members to meet with [place] in-person for a follow-up appointment, so that we can review this information and answer any questions she may have.  In the meantime, she will receive a copy of her positive test result, a copy of her family history, and a letter detailing this information and recommendations.  This will be helpful to share with her at-risk relatives.     Jeanine Luz, MS Genetic Counselor Phone: [phone] [email or web address]

## 2015-03-21 ENCOUNTER — Other Ambulatory Visit (HOSPITAL_BASED_OUTPATIENT_CLINIC_OR_DEPARTMENT_OTHER): Payer: 59

## 2015-03-21 ENCOUNTER — Ambulatory Visit (HOSPITAL_BASED_OUTPATIENT_CLINIC_OR_DEPARTMENT_OTHER): Payer: 59 | Admitting: Oncology

## 2015-03-21 ENCOUNTER — Encounter: Payer: Self-pay | Admitting: Oncology

## 2015-03-21 ENCOUNTER — Telehealth: Payer: Self-pay | Admitting: Oncology

## 2015-03-21 VITALS — BP 127/79 | HR 78 | Temp 98.3°F | Resp 17 | Ht 63.0 in | Wt 148.7 lb

## 2015-03-21 DIAGNOSIS — I2782 Chronic pulmonary embolism: Secondary | ICD-10-CM | POA: Diagnosis not present

## 2015-03-21 DIAGNOSIS — Z1501 Genetic susceptibility to malignant neoplasm of breast: Secondary | ICD-10-CM | POA: Diagnosis not present

## 2015-03-21 DIAGNOSIS — Z1231 Encounter for screening mammogram for malignant neoplasm of breast: Secondary | ICD-10-CM | POA: Diagnosis not present

## 2015-03-21 DIAGNOSIS — Z1239 Encounter for other screening for malignant neoplasm of breast: Secondary | ICD-10-CM

## 2015-03-21 DIAGNOSIS — Z1509 Genetic susceptibility to other malignant neoplasm: Secondary | ICD-10-CM

## 2015-03-21 DIAGNOSIS — C801 Malignant (primary) neoplasm, unspecified: Secondary | ICD-10-CM | POA: Diagnosis not present

## 2015-03-21 DIAGNOSIS — C569 Malignant neoplasm of unspecified ovary: Secondary | ICD-10-CM

## 2015-03-21 DIAGNOSIS — Z5189 Encounter for other specified aftercare: Secondary | ICD-10-CM | POA: Diagnosis not present

## 2015-03-21 LAB — CBC WITH DIFFERENTIAL/PLATELET
BASO%: 0.3 % (ref 0.0–2.0)
Basophils Absolute: 0 10*3/uL (ref 0.0–0.1)
EOS%: 0.6 % (ref 0.0–7.0)
Eosinophils Absolute: 0 10*3/uL (ref 0.0–0.5)
HCT: 33.1 % — ABNORMAL LOW (ref 34.8–46.6)
HGB: 11.1 g/dL — ABNORMAL LOW (ref 11.6–15.9)
LYMPH#: 1.6 10*3/uL (ref 0.9–3.3)
LYMPH%: 50.2 % — AB (ref 14.0–49.7)
MCH: 30.2 pg (ref 25.1–34.0)
MCHC: 33.5 g/dL (ref 31.5–36.0)
MCV: 89.9 fL (ref 79.5–101.0)
MONO#: 0.3 10*3/uL (ref 0.1–0.9)
MONO%: 9.9 % (ref 0.0–14.0)
NEUT%: 39 % (ref 38.4–76.8)
NEUTROS ABS: 1.2 10*3/uL — AB (ref 1.5–6.5)
PLATELETS: 94 10*3/uL — AB (ref 145–400)
RBC: 3.68 10*6/uL — AB (ref 3.70–5.45)
RDW: 14.3 % (ref 11.2–14.5)
WBC: 3.1 10*3/uL — AB (ref 3.9–10.3)

## 2015-03-21 LAB — COMPREHENSIVE METABOLIC PANEL (CC13)
ALT: 27 U/L (ref 0–55)
AST: 18 U/L (ref 5–34)
Albumin: 3.8 g/dL (ref 3.5–5.0)
Alkaline Phosphatase: 108 U/L (ref 40–150)
Anion Gap: 7 mEq/L (ref 3–11)
BILIRUBIN TOTAL: 0.37 mg/dL (ref 0.20–1.20)
BUN: 10.6 mg/dL (ref 7.0–26.0)
CHLORIDE: 103 meq/L (ref 98–109)
CO2: 28 meq/L (ref 22–29)
CREATININE: 0.9 mg/dL (ref 0.6–1.1)
Calcium: 9.4 mg/dL (ref 8.4–10.4)
EGFR: 83 mL/min/{1.73_m2} — ABNORMAL LOW (ref >90)
GLUCOSE: 75 mg/dL (ref 70–140)
Potassium: 4.1 mEq/L (ref 3.5–5.1)
Sodium: 138 mEq/L (ref 136–145)
TOTAL PROTEIN: 6.7 g/dL (ref 6.4–8.3)

## 2015-03-21 MED ORDER — TBO-FILGRASTIM 300 MCG/0.5ML ~~LOC~~ SOSY
300.0000 ug | PREFILLED_SYRINGE | Freq: Once | SUBCUTANEOUS | Status: AC
  Administered 2015-03-21: 300 ug via SUBCUTANEOUS
  Filled 2015-03-21: qty 0.5

## 2015-03-21 MED ORDER — ENOXAPARIN SODIUM 100 MG/ML ~~LOC~~ SOLN: 95.0000 mg | SUBCUTANEOUS | Status: DC

## 2015-03-21 NOTE — Telephone Encounter (Signed)
avs printed for [employer] info given and she will get a call with her scan
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
## 2015-03-22 ENCOUNTER — Telehealth: Payer: Self-pay | Admitting: *Deleted

## 2015-03-22 ENCOUNTER — Ambulatory Visit (HOSPITAL_COMMUNITY): Payer: 59

## 2015-03-22 LAB — CA 125: CA 125: 713 U/mL — AB (ref <35)

## 2015-03-22 NOTE — Telephone Encounter (Signed)
Called pt to inform her of labs results concerning the CA 125 marker which was down (713) from 3 weeks ago. Also reminded pt of her next labs and tx appts on 11/21. Pt verbalized understanding. No further concerns.

## 2015-03-22 NOTE — Telephone Encounter (Signed)
-----   Message from Gordy Levan, MD sent at 03/22/2015  8:03 AM EST ----- Labs seen and need follow up: please let her know CA 125 marker down to 713, so we are on track for next chemo

## 2015-03-22 NOTE — Telephone Encounter (Signed)
LM on patient's voice mail with note below. To call if has questions

## 2015-03-25 ENCOUNTER — Ambulatory Visit (HOSPITAL_BASED_OUTPATIENT_CLINIC_OR_DEPARTMENT_OTHER): Payer: 59

## 2015-03-25 ENCOUNTER — Other Ambulatory Visit (HOSPITAL_BASED_OUTPATIENT_CLINIC_OR_DEPARTMENT_OTHER): Payer: 59

## 2015-03-25 VITALS — BP 108/76 | HR 81 | Temp 98.3°F | Resp 18

## 2015-03-25 DIAGNOSIS — Z5111 Encounter for antineoplastic chemotherapy: Secondary | ICD-10-CM | POA: Diagnosis not present

## 2015-03-25 DIAGNOSIS — C569 Malignant neoplasm of unspecified ovary: Secondary | ICD-10-CM

## 2015-03-25 DIAGNOSIS — C801 Malignant (primary) neoplasm, unspecified: Secondary | ICD-10-CM

## 2015-03-25 LAB — CBC WITH DIFFERENTIAL/PLATELET
BASO%: 0.2 % (ref 0.0–2.0)
Basophils Absolute: 0 10*3/uL (ref 0.0–0.1)
EOS%: 0 % (ref 0.0–7.0)
Eosinophils Absolute: 0 10*3/uL (ref 0.0–0.5)
HEMATOCRIT: 34.7 % — AB (ref 34.8–46.6)
HEMOGLOBIN: 11.5 g/dL — AB (ref 11.6–15.9)
LYMPH#: 0.5 10*3/uL — AB (ref 0.9–3.3)
LYMPH%: 8.4 % — ABNORMAL LOW (ref 14.0–49.7)
MCH: 29.7 pg (ref 25.1–34.0)
MCHC: 33.1 g/dL (ref 31.5–36.0)
MCV: 89.7 fL (ref 79.5–101.0)
MONO#: 0 10*3/uL — ABNORMAL LOW (ref 0.1–0.9)
MONO%: 0.6 % (ref 0.0–14.0)
NEUT%: 90.8 % — ABNORMAL HIGH (ref 38.4–76.8)
NEUTROS ABS: 5 10*3/uL (ref 1.5–6.5)
Platelets: 195 10*3/uL (ref 145–400)
RBC: 3.87 10*6/uL (ref 3.70–5.45)
RDW: 14.8 % — AB (ref 11.2–14.5)
WBC: 5.5 10*3/uL (ref 3.9–10.3)

## 2015-03-25 LAB — COMPREHENSIVE METABOLIC PANEL (CC13)
ALBUMIN: 3.9 g/dL (ref 3.5–5.0)
ALK PHOS: 104 U/L (ref 40–150)
ALT: 28 U/L (ref 0–55)
AST: 15 U/L (ref 5–34)
Anion Gap: 13 mEq/L — ABNORMAL HIGH (ref 3–11)
BILIRUBIN TOTAL: 0.37 mg/dL (ref 0.20–1.20)
BUN: 17.6 mg/dL (ref 7.0–26.0)
CALCIUM: 9.4 mg/dL (ref 8.4–10.4)
CO2: 21 mEq/L — ABNORMAL LOW (ref 22–29)
CREATININE: 1 mg/dL (ref 0.6–1.1)
Chloride: 104 mEq/L (ref 98–109)
EGFR: 72 mL/min/{1.73_m2} — ABNORMAL LOW (ref >90)
GLUCOSE: 211 mg/dL — AB (ref 70–140)
POTASSIUM: 4 meq/L (ref 3.5–5.1)
SODIUM: 138 meq/L (ref 136–145)
TOTAL PROTEIN: 6.8 g/dL (ref 6.4–8.3)

## 2015-03-25 MED ORDER — FAMOTIDINE IN NACL 20-0.9 MG/50ML-% IV SOLN
20.0000 mg | Freq: Once | INTRAVENOUS | Status: AC
  Administered 2015-03-25: 20 mg via INTRAVENOUS

## 2015-03-25 MED ORDER — SODIUM CHLORIDE 0.9 % IV SOLN
Freq: Once | INTRAVENOUS | Status: AC
  Administered 2015-03-25: 10:00:00 via INTRAVENOUS

## 2015-03-25 MED ORDER — DIPHENHYDRAMINE HCL 50 MG/ML IJ SOLN
INTRAMUSCULAR | Status: AC
  Filled 2015-03-25: qty 1

## 2015-03-25 MED ORDER — DIPHENHYDRAMINE HCL 50 MG/ML IJ SOLN
50.0000 mg | Freq: Once | INTRAMUSCULAR | Status: AC
  Administered 2015-03-25: 50 mg via INTRAVENOUS

## 2015-03-25 MED ORDER — PACLITAXEL CHEMO INJECTION 300 MG/50ML
175.0000 mg/m2 | Freq: Once | INTRAVENOUS | Status: AC
  Administered 2015-03-25: 318 mg via INTRAVENOUS
  Filled 2015-03-25: qty 53

## 2015-03-25 MED ORDER — SODIUM CHLORIDE 0.9 % IV SOLN
Freq: Once | INTRAVENOUS | Status: AC
  Administered 2015-03-25: 11:00:00 via INTRAVENOUS
  Filled 2015-03-25: qty 8

## 2015-03-25 MED ORDER — FAMOTIDINE IN NACL 20-0.9 MG/50ML-% IV SOLN
INTRAVENOUS | Status: AC
  Filled 2015-03-25: qty 50

## 2015-03-25 MED ORDER — SODIUM CHLORIDE 0.9 % IV SOLN
Freq: Once | INTRAVENOUS | Status: AC
  Administered 2015-03-25: 11:00:00 via INTRAVENOUS
  Filled 2015-03-25: qty 5

## 2015-03-25 MED ORDER — SODIUM CHLORIDE 0.9 % IV SOLN
546.0000 mg | Freq: Once | INTRAVENOUS | Status: AC
  Administered 2015-03-25: 550 mg via INTRAVENOUS
  Filled 2015-03-25: qty 55

## 2015-03-25 NOTE — Patient Instructions (Signed)
[facility] Discharge Instructions for Patients Receiving Chemotherapy  Today you received the following chemotherapy agents:  Taxol, carboplatin  To help prevent nausea and vomiting after your treatment, we encourage you to take your nausea medication.  Take it as often as prescribed.     If you develop nausea and vomiting that is not controlled by your nausea medication, call the clinic. If it is after clinic hours your family physician or the after hours number for the clinic or go to the Emergency Department.   BELOW ARE SYMPTOMS THAT SHOULD BE REPORTED IMMEDIATELY:  *FEVER GREATER THAN 100.5 F  *CHILLS WITH OR WITHOUT FEVER  NAUSEA AND VOMITING THAT IS NOT CONTROLLED WITH YOUR NAUSEA MEDICATION  *UNUSUAL SHORTNESS OF BREATH  *UNUSUAL BRUISING OR BLEEDING  TENDERNESS IN MOUTH AND THROAT WITH OR WITHOUT PRESENCE OF ULCERS  *URINARY PROBLEMS  *BOWEL PROBLEMS  UNUSUAL RASH Items with * indicate a potential emergency and should be followed up as soon as possible.  Feel free to call the clinic you have any questions or concerns. The clinic phone number is [phone].   I have been informed and understand all the instructions given to me. I know to contact the clinic, my physician, or go to the Emergency Department if any problems should occur. I do not have any questions at this time, but understand that I may call the clinic during office hours   should I have any questions or need assistance in obtaining follow up care.    __________________________________________  _____________  __________ Signature of Patient or Authorized Representative            Date                   Time    __________________________________________ Nurse's Signature    

## 2015-03-26 ENCOUNTER — Other Ambulatory Visit: Payer: 59

## 2015-03-26 ENCOUNTER — Encounter: Payer: 59 | Admitting: Nutrition

## 2015-03-26 ENCOUNTER — Ambulatory Visit: Payer: 59

## 2015-03-27 ENCOUNTER — Ambulatory Visit: Payer: 59 | Admitting: Nutrition

## 2015-03-27 ENCOUNTER — Encounter: Payer: 59 | Admitting: Nutrition

## 2015-03-27 ENCOUNTER — Ambulatory Visit (HOSPITAL_BASED_OUTPATIENT_CLINIC_OR_DEPARTMENT_OTHER): Payer: 59

## 2015-03-27 DIAGNOSIS — E860 Dehydration: Secondary | ICD-10-CM

## 2015-03-27 DIAGNOSIS — C569 Malignant neoplasm of unspecified ovary: Secondary | ICD-10-CM | POA: Diagnosis not present

## 2015-03-27 MED ORDER — DEXAMETHASONE SODIUM PHOSPHATE 100 MG/10ML IJ SOLN
Freq: Once | INTRAMUSCULAR | Status: AC
  Administered 2015-03-27: 12:00:00 via INTRAVENOUS
  Filled 2015-03-27: qty 8

## 2015-03-27 MED ORDER — SODIUM CHLORIDE 0.9 % IV SOLN
INTRAVENOUS | Status: DC
  Administered 2015-03-27: 12:00:00 via INTRAVENOUS

## 2015-03-27 NOTE — Progress Notes (Signed)
Nutrition follow-up completed with patient receiving IV fluids for Ovarian cancer. Patient reports she feels well. Weight increased slightly at 148.7 pounds November 17 from 147.6 pounds October 31. Patient attended "Cooking with [employer]" demonstration class and enjoyed it.  Nutrition diagnosis: Food and nutrition related knowledge deficit resolved.  Provided supportive listening encouraged patient to continue strategies for adequate calories and protein.  Patient will tolerate adequate calories and protein to minimize weight loss.  Patient has my contact information for questions or concerns.

## 2015-03-27 NOTE — Patient Instructions (Signed)

## 2015-03-29 ENCOUNTER — Ambulatory Visit (HOSPITAL_COMMUNITY): Payer: 59

## 2015-03-29 ENCOUNTER — Ambulatory Visit (HOSPITAL_COMMUNITY)
Admission: RE | Admit: 2015-03-29 | Discharge: 2015-03-29 | Disposition: A | Discharge status: Home / Self Care | Payer: 59 | Source: Ambulatory Visit | Attending: Oncology | Admitting: Oncology

## 2015-03-29 DIAGNOSIS — C569 Malignant neoplasm of unspecified ovary: Secondary | ICD-10-CM | POA: Insufficient documentation

## 2015-03-29 DIAGNOSIS — J90 Pleural effusion, not elsewhere classified: Secondary | ICD-10-CM | POA: Diagnosis not present

## 2015-03-29 DIAGNOSIS — R188 Other ascites: Secondary | ICD-10-CM | POA: Insufficient documentation

## 2015-03-29 MED ORDER — IOHEXOL 300 MG/ML  SOLN
100.0000 mL | Freq: Once | INTRAMUSCULAR | Status: AC | PRN
  Administered 2015-03-29: 100 mL via INTRAVENOUS

## 2015-03-29 MED ORDER — IOHEXOL 300 MG/ML  SOLN
50.0000 mL | Freq: Once | INTRAMUSCULAR | Status: AC | PRN
Start: 2015-03-29 — End: 2015-03-29
  Administered 2015-03-29: 50 mL via ORAL

## 2015-04-01 ENCOUNTER — Ambulatory Visit (HOSPITAL_BASED_OUTPATIENT_CLINIC_OR_DEPARTMENT_OTHER): Payer: 59

## 2015-04-01 ENCOUNTER — Encounter: Payer: Self-pay | Admitting: Gynecologic Oncology

## 2015-04-01 ENCOUNTER — Ambulatory Visit: Payer: 59 | Attending: Gynecologic Oncology | Admitting: Gynecologic Oncology

## 2015-04-01 VITALS — BP 120/75 | HR 84 | Temp 98.0°F | Resp 18 | Ht 63.0 in | Wt 148.6 lb

## 2015-04-01 DIAGNOSIS — Z5189 Encounter for other specified aftercare: Secondary | ICD-10-CM

## 2015-04-01 DIAGNOSIS — N838 Other noninflammatory disorders of ovary, fallopian tube and broad ligament: Secondary | ICD-10-CM

## 2015-04-01 DIAGNOSIS — N839 Noninflammatory disorder of ovary, fallopian tube and broad ligament, unspecified: Secondary | ICD-10-CM | POA: Diagnosis not present

## 2015-04-01 DIAGNOSIS — C801 Malignant (primary) neoplasm, unspecified: Secondary | ICD-10-CM | POA: Diagnosis not present

## 2015-04-01 DIAGNOSIS — C569 Malignant neoplasm of unspecified ovary: Secondary | ICD-10-CM

## 2015-04-01 MED ORDER — TBO-FILGRASTIM 300 MCG/0.5ML ~~LOC~~ SOSY
300.0000 ug | PREFILLED_SYRINGE | Freq: Once | SUBCUTANEOUS | Status: AC
  Administered 2015-04-01: 300 ug via SUBCUTANEOUS
  Filled 2015-04-01: qty 0.5

## 2015-04-01 NOTE — Progress Notes (Signed)
Consult Note: Gyn-Onc  Consult was requested by Dr. Kennon Rounds for the evaluation of Elizabeth Henry 44 y.o. female  CC:  Chief Complaint  Patient presents with  . ovarian mass    follow up    Assessment/Plan:  Elizabeth Henry  is a 44 y.o.  year old with stage IV ovarian cancer in the setting of acute PE, s/p 3 cycles of neoadjuvant chemotherapy. Demonstrating partial response to therapy. Measurable disease is still present in the large dominant ovarian cystic mass, however peritoneal nodularity has resolved as has PE.  Will obtain IVC filter.  Plan for interval debulking (TAH, BSO, omentectomy, radical debulking) on 04/16/15 when she is 21+ days post day 1 of cycle 3.  Discussed operative risks with patient, particularly those of bleeding complications. I discussed that these are elevated in a patient with recent PE on anticoagulation. Discussed possibility of risk of needing bowel resection. Discussed anticipated hospital length of stay and recovery.  Patient will likely resume chemotherapy 3 weeks postop.  Her last dose of preop lovenox will be on the morning prior to surgery.   HPI: Elizabeth Henry is a very pleasant 44 year old G2P2 who was initially seen in consultation at the request of Dr Kennon Rounds for bilateral  Ovarian masses, ascites, elevated CA-125, and abnormal uterine bleeding. The patient began experiencing abdominal bloating and early satiety 6 weeks ago in July 2016. She since the end of August 2016 she has experienced vaginal bleeding every day that is light. She denies pain other than stretching pain in her abdomen. She's had some constipation but no change in the caliber of his stools and no blood in her bowel movements.  She was evaluated by Dr. Kennon Rounds on 01/25/2015. A pelvic and abdominal ultrasound scan were ordered and revealed large volume of ascites in the abdomen, bilateral ovarian masses with the right measuring 24.3 x 13.1 x 25.3 cm with solid and cystic  components and blood flow within the solid components. The left ovary measured 14.5 x 12.7 x 14.8 cm and containing solid cystic components with increased blood flow. The endometrial stripe was slightly thickened at 9 mm (cc patient is premenopausal). A CA-125 was drawn on 01/28/2015 and was elevated at 3142 units per milliliter.  A CT chest was performed on 01/31/15 which showed an acute PE and pleural effusions.  She was started on therapeutic lovenox.  She was started on neoadjuvant chemotherapy (due to her acute PE and the concern about discontinuing anticoagulation for surgery in the acute setting).  Prior to starting chemotherapy she underwent paracentesis and thoracentesis (right) both of which confirmed metastatic adencarcinoma consistent with gyn primary.  Interval Hx: On 02/12/15 she began chemotherapy with paclitaxel and carboplatin. She has tolerated this well with no dose delays. She feels generally much better with resolution of ascites. She is eating well.  Day 1 of cycle 3 was 03/25/15.  CA 125 has decreased to 713 on 03/21/15.  The patient is otherwise a very healthy woman. She has no chronic medical complaints other than anxiety. She has a significant family history for a mother who died from breast cancer at age 31, and a maternal grandmother who died from cancer of unknown source at a premenopausal age.   Elizabeth Henry has not had prior abdominal surgeries. She has history of 2 vaginal deliveries. She has a remote history of abnormal smears but Pap smears in recent years have all been normal.  Current Meds:  Outpatient Encounter Prescriptions as of 04/01/2015  Medication Sig  . Cholecalciferol (VITAMIN D) 2000 UNITS tablet Take 4,000 Units by mouth daily.  Marland Kitchen dexamethasone (DECADRON) 4 MG tablet Take 5 tabs with food 12 hrs and 6 hrs prior to Taxol  Chemotherapy.  . enoxaparin (LOVENOX) 80 MG/0.8ML injection Inject 0.7 mLs (70 mg total) into the skin every 12 (twelve) hours.  .  hydrOXYzine (ATARAX/VISTARIL) 25 MG tablet   . LINZESS 145 MCG CAPS capsule Take 1 capsule by mouth.  Marland Kitchen LORazepam (ATIVAN) 1 MG tablet 1 SL or PO every 6 hours as needed for nausea. May use 1-2 tablets  HS as needed  . Melatonin 2.5 MG CAPS Take 5 mg by mouth at bedtime.  . montelukast (SINGULAIR) 10 MG tablet Take 10 mg by mouth at bedtime.  . naproxen (NAPROSYN) 500 MG tablet Take 500 mg by mouth 2 (two) times daily with a meal.  . pantoprazole (PROTONIX) 40 MG tablet Take 40 mg by mouth daily.  Marland Kitchen venlafaxine (EFFEXOR) 100 MG tablet Take 100 mg by mouth 2 (two) times daily.  Marland Kitchen enoxaparin (LOVENOX) 100 MG/ML injection Inject 0.95 mLs (95 mg total) into the skin daily. (Patient not taking: Reported on 04/01/2015)  . loratadine (CLARITIN) 10 MG tablet Take 10 mg by mouth daily.  . ondansetron (ZOFRAN) 8 MG tablet Take 1 tablet (8 mg total) by mouth every 8 (eight) hours as needed for nausea or vomiting. (Patient not taking: Reported on 04/01/2015)  . sodium chloride (OCEAN) 0.65 % SOLN nasal spray Place 1 spray into both nostrils as needed for congestion. (Patient not taking: Reported on 03/14/2015)  . SUMAtriptan (IMITREX) 100 MG tablet Take 100 mg by mouth as needed for migraine. May repeat in 2 hours if headache persists or recurs.  . [DISCONTINUED] hydrOXYzine (VISTARIL) 25 MG capsule Take 25 mg by mouth at bedtime.  . [DISCONTINUED] prochlorperazine (COMPAZINE) 10 MG tablet Take 1 tablet (10 mg total) by mouth every 6 (six) hours as needed for nausea or vomiting.  . [DISCONTINUED] tiZANidine (ZANAFLEX) 4 MG capsule Take 4 mg by mouth at bedtime.  . [DISCONTINUED] traMADol (ULTRAM) 50 MG tablet Take 1-2 tablets (50-100 mg total) by mouth every 8 (eight) hours as needed (pain).   No facility-administered encounter medications on file as of 04/01/2015.    Allergy:  Allergies  Allergen Reactions  . Ambien [Zolpidem Tartrate] Swelling  . Compazine [Prochlorperazine Edisylate] Hives  . Tylox  [Oxycodone-Acetaminophen] Hives    Social Hx:   Social History   Social History  . Marital Status: Married    Spouse Name: N/A  . Number of Children: 2  . Years of Education: 16   Occupational History  . Self-employed    Social History Main Topics  . Smoking status: Former Smoker -- 1.00 packs/day for 10 years    Types: Cigarettes    Quit date: 10/18/2002  . Smokeless tobacco: Never Used     Comment: smoked on and off for 10 yrs total - up to 1 ppd at most  . Alcohol Use: No     Comment: very rarely  . Drug Use: No  . Sexual Activity: Not Currently   Other Topics Concern  . Not on file   Social History Narrative   Fun: Go to [employer]; crochet   Denies religious beliefs effecting health care.     Past Surgical Hx: History reviewed. No pertinent past surgical history.  Past Medical Hx:  Past Medical History  Diagnosis Date  . Anxiety   .  Allergy   . Migraines   . GERD (gastroesophageal reflux disease)     Past Gynecological History:  Remote hx of abn paps.  No LMP recorded.  Family Hx:  Family History  Problem Relation Age of Onset  . Breast cancer Mother 5  . Healthy Father   . Breast cancer Maternal Grandmother     dx. 65 or younger  . Brain cancer Paternal Grandmother     unspecified tumor type  . HIV Brother   . Leukemia Paternal Uncle   . HIV Brother   . Alzheimer's disease Maternal Grandfather   . Diabetes Maternal Grandfather   . Breast cancer Other   . Cancer Paternal Uncle     unspecified type    Review of Systems:  Constitutional  Feels distended  ENT Normal appearing ears and nares bilaterally Skin/Breast  No rash, sores, jaundice, itching, dryness Cardiovascular  No chest pain, shortness of breath, or edema  Pulmonary  No cough or wheeze.  Gastro Intestinal  No nausea, vomitting, or diarrhoea.  Genito Urinary  No frequency, urgency, dysuria, no vaginal bleeding Musculo Skeletal  No myalgia, arthralgia, joint swelling or  pain  Neurologic  No weakness, numbness, change in gait,  Psychology  No depression, anxiety, insomnia.   Vitals:  Blood pressure 120/75, pulse 84, temperature 98 F (36.7 C), temperature source Oral, resp. rate 18, height 5\' 3"  (1.6 m), weight 148 lb 9.6 oz (67.405 kg), SpO2 100 %.  Physical Exam: WD in NAD Neck  Supple NROM, without any enlargements.  Lymph Node Survey No cervical supraclavicular or inguinal adenopathy Cardiovascular  Pulse normal rate, regularity and rhythm. S1 and S2 normal.  Lungs  Clear to auscultation bilateraly, without wheezes/crackles/rhonchi. Good air movement.  Skin  No rash/lesions/breakdown  Psychiatry  Alert and oriented to person, place, and time  Abdomen  Normoactive bowel sounds, abdomen mildly and distended with large cystic mass, non-tender and thin without evidence of hernia.  Back No CVA tenderness Genito Urinary  Vulva/vagina: Normal external female genitalia.   No lesions. No discharge or bleeding.  Bladder/urethra:  No lesions or masses, well supported bladder  Vagina: normal  Cervix: Normal appearing, no lesions.  Uterus:  Small, mobile, no parametrial involvement or nodularity.  Adnexa: no discretely palpable masses in the pelvis, though fullness beyond is appreciated. Rectal  Good tone, no masses no cul de sac nodularity.  Extremities  No bilateral cyanosis, clubbing or edema.    Donaciano Eva, MD  04/01/2015, 1:15 PM

## 2015-04-01 NOTE — Patient Instructions (Addendum)
Preparing for your Surgery  Plan for surgery on December 13 with Dr. Everitt Amber.  You will be scheduled for an exploratory laparotomy, total abdominal hysterectomy, bilateral salpingo-oophorectomy (removal of tubes and ovaries), omentectomy, and removal of tumor deposits (debulking).  You will need to have an IVC filter placed before surgery.  You will receive a phone call from [facility] to schedule an appointment to have the filter placed.  Take your lovenox in the morning with your last dose the morning of the 12th.  Do not take the morning of surgery.    You will also need to drink one bottle of magnesium citrate the day before surgery starting around lunchtime.   Pre-operative Testing -You will receive a phone call from presurgical testing at [facility] to arrange for a pre-operative testing appointment before your surgery.  This appointment normally occurs one to two weeks before your scheduled surgery.   -Bring your insurance card, copy of an advanced directive if applicable, medication list  -At that visit, you will be asked to sign a consent for a possible blood transfusion in case a transfusion becomes necessary during surgery.  The need for a blood transfusion is rare but having consent is a necessary part of your care.     -You should not be taking blood thinners or aspirin at least ten days prior to surgery unless instructed by your surgeon.  Day Before Surgery at [facility] will be asked to take in only clear liquids the day before surgery.  Examples of clear liquids include broths, jello, and clear juices.  Avoid carbonated beverages.  You will be advised to have nothing to eat or drink after midnight the evening before.    Your role in recovery Your role is to become active as soon as directed by your doctor, while still giving yourself time to heal.  Rest when you feel tired. You will be asked to do the following in order to speed your  recovery:  - Cough and breathe deeply. This helps toclear and expand your lungs and can prevent pneumonia. You may be given a spirometer to practice deep breathing. A staff member will show you how to use the spirometer. - Do mild physical activity. Walking or moving your legs help your circulation and body functions return to normal. A staff member will help you when you try to walk and will provide you with simple exercises. Do not try to get up or walk alone the first time. - Actively manage your pain. Managing your pain lets you move in comfort. We will ask you to rate your pain on a scale of zero to 10. It is your responsibility to tell your doctor or nurse where and how much you hurt so your pain can be treated.  Special Considerations -If you are diabetic, you may be placed on insulin after surgery to have closer control over your blood sugars to promote healing and recovery.  This does not mean that you will be discharged on insulin.  If applicable, your oral antidiabetics will be resumed when you are tolerating a solid diet.  -Your final pathology results from surgery should be available by the Friday after surgery and the results will be relayed to you when available.  Blood Transfusion Information WHAT IS A BLOOD TRANSFUSION? A transfusion is the replacement of blood or some of its parts. Blood is made up of multiple cells which provide different functions.  Red blood cells carry oxygen and are  used for blood loss replacement.  White blood cells fight against infection.  Platelets control bleeding.  Plasma helps clot blood.  Other blood products are available for specialized needs, such as hemophilia or other clotting disorders. BEFORE THE TRANSFUSION  Who gives blood for transfusions?   You may be able to donate blood to be used at a later date on yourself (autologous donation).  Relatives can be asked to donate blood. This is generally not any safer than if you have received  blood from a stranger. The same precautions are taken to ensure safety when a relative's blood is donated.  Healthy volunteers who are fully evaluated to make sure their blood is safe. This is blood bank blood. Transfusion therapy is the safest it has ever been in the practice of medicine. Before blood is taken from a donor, a complete history is taken to make sure that person has no history of diseases nor engages in risky social behavior (examples are intravenous drug use or sexual activity with multiple partners). The donor's travel history is screened to minimize risk of transmitting infections, such as malaria. The donated blood is tested for signs of infectious diseases, such as HIV and hepatitis. The blood is then tested to be sure it is compatible with you in order to minimize the chance of a transfusion reaction. If you or a relative donates blood, this is often done in anticipation of surgery and is not appropriate for emergency situations. It takes many days to process the donated blood. RISKS AND COMPLICATIONS Although transfusion therapy is very safe and saves many lives, the main dangers of transfusion include:   Getting an infectious disease.  Developing a transfusion reaction. This is an allergic reaction to something in the blood you were given. Every precaution is taken to prevent this. The decision to have a blood transfusion has been considered carefully by your caregiver before blood is given. Blood is not given unless the benefits outweigh the risks.  Inferior Vena Cava Filter Insertion Insertion of an inferior vena cava (IVC) filter is a procedure in which a filter is placed into the large vein in your abdomen that carries blood from the lower part of your body to your heart (inferior vena cava). Placement of the filter here helps prevent blood clots in the legs or pelvis from traveling to your lungs. A large blood clot in the lungs can cause death.  The filter is a small, metal  device about an inch long. It is shaped like the spokes of an umbrella and is inserted through a pathway created in your neck or groin. The risks of this procedure are usually small and easily managed. Inferior vena cava filters are only used when blood thinners (anticoagulants) cannot be used to prevent blood clots from forming. This may occur because of:  You have severe platelet problems or shortage.  You have had recent or current major bleeding that cannot be treated.  You have bleeding associated with anticoagulants.  You have recurrence of blood clots while on anticoagulants.  You have a need for surgery in the near future.  You have bleeding in your head. EXPECTATIONS OF A FILTER  An IVC filter will reduce the risk of a large blood clot making its way to your lungs (pulmonary embolus, or PE). It cannot eliminate the risk completely, or prevent small PEs from occurring.  It does not stop blood clots from growing, recurring, or developing into postphlebitic syndrome. This is a condition that can occur  after there is inflammation in a vein (phlebitis). LET [facility] CARE PROVIDER KNOW ABOUT:  Any allergies you have. This includes an allergy to iodine or contrast dye.  All medicines you are taking, including vitamins, herbs, eye drops, creams and over-the-counter medicines.  Previous problems you or members of your family have had with the use of anesthetics.  Any blood disorders you have.  Previous surgeries you had had.  Medical conditions you have.  Possibility of pregnancy, if this applies. RISKS AND COMPLICATIONS Generally, this is a safe procedure. However, as with any procedure, problems can occur. Possible problems include:  A small bruise around the needle insertion site. A larger pooling of blood called a hematoma may form. This is usually of no concern.  The filter can block the vena cava. This can cause some swelling of the legs.  The filter may eventually  fail and not work properly.  Continued bleeding or infections (uncommon).  Damage to the vein by the catheter (rare). BEFORE THE PROCEDURE  Your health care provider may want you to have blood tests. These tests can help tell how well your kidneys and liver are working. They can also show how well your blood clots.  If you take blood thinners, ask your health care provider if and when you should stop taking them.  Do not eat or drink for 4 hours before the procedure or as directed by your health care provider.  Make arrangements for someone to drive you home. Depending on the procedure, you may be able to go home the same day.  PROCEDURE   Insertion of a Vena Cava filter is performed by a vascular surgeon or cardiologist in a cardiac catheterization lab.  The procedure usually takes about 30 minutes to 1 hour. This can vary.  An IV needle will be inserted into one of your veins. Medicine will be able to flow directly into your body through this needle.  Medicines may given to help you relax and relieve anxiety (sedative).  The procedure is done through a large vein either in your neck or groin. The skin around this area is cleaned and shaved, as necessary.  The skin and deeper tissues over the vein will be made numb with a local anesthetic. You are awake during the procedure and can let your health care providers know if you have discomfort.  A needle is then put into the vein. A guide wire is placed through the needle and into the vein. This is used to help insert a catheter and the IVC filter into your vein.  Contrast dye may be injected into the inferior vena cava to help guide the catheter and verify precise placement of the IVC filter. X-ray equipment may also be used to verify that the catheter and the wire are in the correct position.  The wire is then withdrawn.  The IVC filter is then passed over the catheter into the vein, and inserted into the correct location in the  vena cava.  The catheter is then removed. Pressure will be kept on the needle insertion point for several minutes or until it is unlikely to bleed. AFTER THE PROCEDURE  You will stay in a recovery area until any sedation medicine you were given has worn off. Your blood pressure and pulse will be checked.  If there are no problems, you should be able to go home after the procedure.  You may feel sore at the area of the needle insertion for a few days.  This information is not intended to replace advice given to you by your health care provider. Make sure you discuss any questions you have with your health care provider.   Document Released: 06/10/2005 Document Revised: 05/11/2014 Document Reviewed: 12/26/2012 Elsevier Interactive Patient Education [employer].

## 2015-04-02 ENCOUNTER — Ambulatory Visit (HOSPITAL_BASED_OUTPATIENT_CLINIC_OR_DEPARTMENT_OTHER): Payer: 59

## 2015-04-02 ENCOUNTER — Telehealth: Payer: Self-pay | Admitting: Genetic Counselor

## 2015-04-02 VITALS — BP 109/66 | HR 83 | Temp 98.4°F

## 2015-04-02 DIAGNOSIS — Z5189 Encounter for other specified aftercare: Secondary | ICD-10-CM

## 2015-04-02 DIAGNOSIS — C569 Malignant neoplasm of unspecified ovary: Secondary | ICD-10-CM

## 2015-04-02 DIAGNOSIS — C801 Malignant (primary) neoplasm, unspecified: Secondary | ICD-10-CM

## 2015-04-02 MED ORDER — TBO-FILGRASTIM 300 MCG/0.5ML ~~LOC~~ SOSY
300.0000 ug | PREFILLED_SYRINGE | Freq: Once | SUBCUTANEOUS | Status: AC
  Administered 2015-04-02: 300 ug via SUBCUTANEOUS
  Filled 2015-04-02: qty 0.5

## 2015-04-03 ENCOUNTER — Other Ambulatory Visit: Payer: Self-pay | Admitting: Oncology

## 2015-04-03 DIAGNOSIS — C569 Malignant neoplasm of unspecified ovary: Secondary | ICD-10-CM

## 2015-04-03 DIAGNOSIS — T451X5A Adverse effect of antineoplastic and immunosuppressive drugs, initial encounter: Secondary | ICD-10-CM

## 2015-04-03 DIAGNOSIS — D6481 Anemia due to antineoplastic chemotherapy: Secondary | ICD-10-CM

## 2015-04-04 ENCOUNTER — Other Ambulatory Visit (HOSPITAL_BASED_OUTPATIENT_CLINIC_OR_DEPARTMENT_OTHER): Payer: 59

## 2015-04-04 ENCOUNTER — Encounter: Payer: Self-pay | Admitting: Oncology

## 2015-04-04 ENCOUNTER — Ambulatory Visit (HOSPITAL_BASED_OUTPATIENT_CLINIC_OR_DEPARTMENT_OTHER): Payer: 59 | Admitting: Oncology

## 2015-04-04 VITALS — BP 122/78 | HR 76 | Temp 97.9°F | Resp 18 | Ht 63.0 in | Wt 149.3 lb

## 2015-04-04 DIAGNOSIS — N1330 Unspecified hydronephrosis: Secondary | ICD-10-CM

## 2015-04-04 DIAGNOSIS — K5903 Drug induced constipation: Secondary | ICD-10-CM

## 2015-04-04 DIAGNOSIS — C569 Malignant neoplasm of unspecified ovary: Secondary | ICD-10-CM

## 2015-04-04 DIAGNOSIS — K219 Gastro-esophageal reflux disease without esophagitis: Secondary | ICD-10-CM

## 2015-04-04 DIAGNOSIS — D649 Anemia, unspecified: Secondary | ICD-10-CM

## 2015-04-04 DIAGNOSIS — C801 Malignant (primary) neoplasm, unspecified: Secondary | ICD-10-CM

## 2015-04-04 DIAGNOSIS — D6481 Anemia due to antineoplastic chemotherapy: Secondary | ICD-10-CM

## 2015-04-04 DIAGNOSIS — Z803 Family history of malignant neoplasm of breast: Secondary | ICD-10-CM

## 2015-04-04 DIAGNOSIS — I2699 Other pulmonary embolism without acute cor pulmonale: Secondary | ICD-10-CM

## 2015-04-04 DIAGNOSIS — Z1501 Genetic susceptibility to malignant neoplasm of breast: Secondary | ICD-10-CM

## 2015-04-04 DIAGNOSIS — Z7901 Long term (current) use of anticoagulants: Secondary | ICD-10-CM

## 2015-04-04 DIAGNOSIS — T451X5A Adverse effect of antineoplastic and immunosuppressive drugs, initial encounter: Secondary | ICD-10-CM

## 2015-04-04 DIAGNOSIS — Z1509 Genetic susceptibility to other malignant neoplasm: Secondary | ICD-10-CM

## 2015-04-04 DIAGNOSIS — G622 Polyneuropathy due to other toxic agents: Secondary | ICD-10-CM

## 2015-04-04 DIAGNOSIS — I2782 Chronic pulmonary embolism: Secondary | ICD-10-CM

## 2015-04-04 LAB — CBC WITH DIFFERENTIAL/PLATELET
BASO%: 0.4 % (ref 0.0–2.0)
Basophils Absolute: 0 10*3/uL (ref 0.0–0.1)
EOS%: 0.2 % (ref 0.0–7.0)
Eosinophils Absolute: 0 10*3/uL (ref 0.0–0.5)
HEMATOCRIT: 31.5 % — AB (ref 34.8–46.6)
HGB: 10.6 g/dL — ABNORMAL LOW (ref 11.6–15.9)
LYMPH#: 2.2 10*3/uL (ref 0.9–3.3)
LYMPH%: 22.5 % (ref 14.0–49.7)
MCH: 30.3 pg (ref 25.1–34.0)
MCHC: 33.5 g/dL (ref 31.5–36.0)
MCV: 90.5 fL (ref 79.5–101.0)
MONO#: 1.1 10*3/uL — ABNORMAL HIGH (ref 0.1–0.9)
MONO%: 10.7 % (ref 0.0–14.0)
NEUT#: 6.5 10*3/uL (ref 1.5–6.5)
NEUT%: 66.2 % (ref 38.4–76.8)
Platelets: 343 10*3/uL (ref 145–400)
RBC: 3.48 10*6/uL — AB (ref 3.70–5.45)
RDW: 18.2 % — ABNORMAL HIGH (ref 11.2–14.5)
WBC: 9.8 10*3/uL (ref 3.9–10.3)

## 2015-04-04 LAB — IRON AND TIBC CHCC
%SAT: 57 % (ref 21–57)
Iron: 149 ug/dL — ABNORMAL HIGH (ref 41–142)
TIBC: 263 ug/dL (ref 236–444)
UIBC: 113 ug/dL — AB (ref 120–384)

## 2015-04-04 MED ORDER — FERROUS FUMARATE 325 (106 FE) MG PO TABS: ORAL_TABLET | ORAL | Status: DC
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
## 2015-04-05 ENCOUNTER — Telehealth: Payer: Self-pay | Admitting: Oncology

## 2015-04-05 NOTE — Telephone Encounter (Signed)
lvm for pt regarding to [place] and Jan 2017 appt...Marland KitchenMarland Kitchen

## 2015-04-08 ENCOUNTER — Ambulatory Visit: Payer: 59 | Admitting: Gynecologic Oncology

## 2015-04-08 ENCOUNTER — Telehealth: Payer: Self-pay | Admitting: Oncology

## 2015-04-08 NOTE — Telephone Encounter (Signed)
Requested Mammo from [facility]

## 2015-04-08 NOTE — Telephone Encounter (Signed)
Left message for patient to follow-up regarding her insurance-related question.  She can call my office at [phone].

## 2015-04-09 ENCOUNTER — Encounter (HOSPITAL_COMMUNITY): Payer: Self-pay

## 2015-04-09 ENCOUNTER — Other Ambulatory Visit: Payer: Self-pay | Admitting: Radiology

## 2015-04-10 ENCOUNTER — Other Ambulatory Visit: Payer: Self-pay | Admitting: Radiology

## 2015-04-11 ENCOUNTER — Ambulatory Visit (HOSPITAL_COMMUNITY)
Admission: RE | Admit: 2015-04-11 | Discharge: 2015-04-11 | Disposition: A | Discharge status: Home / Self Care | Payer: 59 | Source: Ambulatory Visit | Attending: Gynecologic Oncology | Admitting: Gynecologic Oncology

## 2015-04-11 ENCOUNTER — Encounter (HOSPITAL_COMMUNITY): Payer: Self-pay

## 2015-04-11 DIAGNOSIS — Z298 Encounter for other specified prophylactic measures: Secondary | ICD-10-CM | POA: Insufficient documentation

## 2015-04-11 DIAGNOSIS — C569 Malignant neoplasm of unspecified ovary: Secondary | ICD-10-CM | POA: Diagnosis not present

## 2015-04-11 DIAGNOSIS — N838 Other noninflammatory disorders of ovary, fallopian tube and broad ligament: Secondary | ICD-10-CM

## 2015-04-11 DIAGNOSIS — Z7901 Long term (current) use of anticoagulants: Secondary | ICD-10-CM | POA: Diagnosis not present

## 2015-04-11 DIAGNOSIS — R140 Abdominal distension (gaseous): Secondary | ICD-10-CM

## 2015-04-11 DIAGNOSIS — Z86711 Personal history of pulmonary embolism: Secondary | ICD-10-CM | POA: Insufficient documentation

## 2015-04-11 LAB — APTT: APTT: 30 s (ref 24–37)

## 2015-04-11 LAB — BASIC METABOLIC PANEL
Anion gap: 9 (ref 5–15)
BUN: 10 mg/dL (ref 6–20)
CALCIUM: 9.3 mg/dL (ref 8.9–10.3)
CO2: 26 mmol/L (ref 22–32)
CREATININE: 0.88 mg/dL (ref 0.44–1.00)
Chloride: 105 mmol/L (ref 101–111)
GFR calc Af Amer: >60 mL/min (ref >60)
GFR calc non Af Amer: >60 mL/min (ref >60)
GLUCOSE: 89 mg/dL (ref 65–99)
Potassium: 3.7 mmol/L (ref 3.5–5.1)
Sodium: 140 mmol/L (ref 135–145)

## 2015-04-11 LAB — CBC
HCT: 33.1 % — ABNORMAL LOW (ref 36.0–46.0)
HEMOGLOBIN: 11.1 g/dL — AB (ref 12.0–15.0)
MCH: 30.6 pg (ref 26.0–34.0)
MCHC: 33.5 g/dL (ref 30.0–36.0)
MCV: 91.2 fL (ref 78.0–100.0)
PLATELETS: 165 10*3/uL (ref 150–400)
RBC: 3.63 MIL/uL — ABNORMAL LOW (ref 3.87–5.11)
RDW: 17.1 % — AB (ref 11.5–15.5)
WBC: 3.5 10*3/uL — ABNORMAL LOW (ref 4.0–10.5)

## 2015-04-11 LAB — PROTIME-INR
INR: 1 (ref 0.00–1.49)
PROTHROMBIN TIME: 13.4 s (ref 11.6–15.2)

## 2015-04-11 LAB — HCG, SERUM, QUALITATIVE: Preg, Serum: NEGATIVE

## 2015-04-11 MED ORDER — SODIUM CHLORIDE 0.9 % IV SOLN
INTRAVENOUS | Status: DC
  Administered 2015-04-11: 08:00:00 via INTRAVENOUS

## 2015-04-11 MED ORDER — LIDOCAINE HCL 1 % IJ SOLN
INTRAMUSCULAR | Status: AC
  Filled 2015-04-11: qty 20

## 2015-04-11 MED ORDER — FENTANYL CITRATE (PF) 100 MCG/2ML IJ SOLN
INTRAMUSCULAR | Status: AC | PRN
  Administered 2015-04-11: 50 ug via INTRAVENOUS

## 2015-04-11 MED ORDER — MIDAZOLAM HCL 2 MG/2ML IJ SOLN
INTRAMUSCULAR | Status: AC | PRN
Start: 2015-04-11 — End: 2015-04-11
  Administered 2015-04-11 (×4): 1 mg via INTRAVENOUS

## 2015-04-11 MED ORDER — HYDROCODONE-ACETAMINOPHEN 5-325 MG PO TABS: 1.0000 | ORAL_TABLET | ORAL | Status: DC | PRN

## 2015-04-11 MED ORDER — FENTANYL CITRATE (PF) 100 MCG/2ML IJ SOLN
INTRAMUSCULAR | Status: AC
  Filled 2015-04-11: qty 4

## 2015-04-11 MED ORDER — IOHEXOL 300 MG/ML  SOLN
150.0000 mL | Freq: Once | INTRAMUSCULAR | Status: AC | PRN
  Administered 2015-04-11: 40 mL

## 2015-04-11 MED ORDER — MIDAZOLAM HCL 2 MG/2ML IJ SOLN
INTRAMUSCULAR | Status: AC
  Filled 2015-04-11: qty 6

## 2015-04-11 NOTE — Patient Instructions (Addendum)
Elizabeth Henry  04/11/2015   Your procedure is scheduled on: Tuesday 04/16/2015    Report to [facility] Main  Entrance take [place]  elevators to 3rd floor to  [place] at  [place] AM.  Call this number if you have problems the morning of surgery [phone]   Remember: ONLY 1 PERSON MAY GO WITH YOU TO SHORT STAY TO GET  READY MORNING OF [place].  Do not eat food or drink liquids :After Midnight.              Clear liquid diet beginning on 04/15/2015.                 One bottle of Mag Citrate day before surgery.       Take these medicines the morning of surgery with A SIP OF WATER:   Protonix, Effexor                                 You may not have any metal on your body including hair pins and              piercings  Do not wear jewelry, make-up, lotions, powders or perfumes, deodorant             Do not wear nail polish.  Do not shave  48 hours prior to surgery.              Men may shave face and neck.   Do not bring valuables to the hospital. [place].  Contacts, dentures or bridgework may not be worn into surgery.  Leave suitcase in the car. After surgery it may be brought to your room.         Special Instructions: deep breathing exercises, leg exercises               Please read over the following fact sheets you were given: _____________________________________________________________________                CLEAR LIQUID DIET   Foods Allowed                                                                     Foods Excluded  Coffee and tea, regular and decaf                             liquids that you cannot  Plain Jell-O in any flavor                                             see through such as: Fruit ices (not with fruit pulp)  milk, soups, orange juice  Iced Popsicles                                    All solid food Carbonated  beverages, regular and diet                                    Cranberry, grape and apple juices Sports drinks like Gatorade Lightly seasoned clear broth or consume(fat free) Sugar, honey syrup  Sample Menu Breakfast                                Lunch                                     Supper Cranberry juice                    Beef broth                            Chicken broth Jell-O                                     Grape juice                           Apple juice Coffee or tea                        Jell-O                                      Popsicle                                                Coffee or tea                        Coffee or tea  _____________________________________________________________________  [facility] - Preparing for Surgery Before surgery, you can play an important role.  Because skin is not sterile, your skin needs to be as free of germs as possible.  You can reduce the number of germs on your skin by washing with CHG (chlorahexidine gluconate) soap before surgery.  CHG is an antiseptic cleaner which kills germs and bonds with the skin to continue killing germs even after washing. Please DO NOT use if you have an allergy to CHG or antibacterial soaps.  If your skin becomes reddened/irritated stop using the CHG and inform your nurse when you arrive at Short Stay. Do not shave (including legs and underarms) for at least 48 hours prior to the first CHG shower.  You may shave your face/neck. Please follow these instructions carefully:  1.  Shower with CHG Soap the night before surgery and the  morning of Surgery.  2.  If you choose to  wash your hair, wash your hair first as usual with your  normal  shampoo.  3.  After you shampoo, rinse your hair and body thoroughly to remove the  shampoo.                           4.  Use CHG as you would any other liquid soap.  You can apply chg directly  to the skin and wash                       Gently with a scrungie or  clean washcloth.  5.  Apply the CHG Soap to your body ONLY FROM THE NECK DOWN.   Do not use on face/ open                           Wound or open sores. Avoid contact with eyes, ears mouth and genitals (private parts).                       Wash face,  Genitals (private parts) with your normal soap.             6.  Wash thoroughly, paying special attention to the area where your surgery  will be performed.  7.  Thoroughly rinse your body with warm water from the neck down.  8.  DO NOT shower/wash with your normal soap after using and rinsing off  the CHG Soap.                9.  Pat yourself dry with a clean towel.            10.  Wear clean pajamas.            11.  Place clean sheets on your bed the night of your first shower and do not  sleep with pets. Day of Surgery : Do not apply any lotions/deodorants the morning of surgery.  Please wear clean clothes to the hospital/surgery center.  FAILURE TO FOLLOW THESE INSTRUCTIONS MAY RESULT IN THE CANCELLATION OF YOUR SURGERY PATIENT SIGNATURE_________________________________  NURSE SIGNATURE__________________________________  ________________________________________________________________________  WHAT IS A BLOOD TRANSFUSION? Blood Transfusion Information  A transfusion is the replacement of blood or some of its parts. Blood is made up of multiple cells which provide different functions.  Red blood cells carry oxygen and are used for blood loss replacement.  White blood cells fight against infection.  Platelets control bleeding.  Plasma helps clot blood.  Other blood products are available for specialized needs, such as hemophilia or other clotting disorders. BEFORE THE TRANSFUSION  Who gives blood for transfusions?   Healthy volunteers who are fully evaluated to make sure their blood is safe. This is blood bank blood. Transfusion therapy is the safest it has ever been in the practice of medicine. Before blood is taken from a  donor, a complete history is taken to make sure that person has no history of diseases nor engages in risky social behavior (examples are intravenous drug use or sexual activity with multiple partners). The donor's travel history is screened to minimize risk of transmitting infections, such as malaria. The donated blood is tested for signs of infectious diseases, such as HIV and hepatitis. The blood is then tested to be sure it is compatible with you in order to minimize the chance of a transfusion reaction. If you or  a relative donates blood, this is often done in anticipation of surgery and is not appropriate for emergency situations. It takes many days to process the donated blood. RISKS AND COMPLICATIONS Although transfusion therapy is very safe and saves many lives, the main dangers of transfusion include:  1. Getting an infectious disease. 2. Developing a transfusion reaction. This is an allergic reaction to something in the blood you were given. Every precaution is taken to prevent this. The decision to have a blood transfusion has been considered carefully by your caregiver before blood is given. Blood is not given unless the benefits outweigh the risks. AFTER THE TRANSFUSION  Right after receiving a blood transfusion, you will usually feel much better and more energetic. This is especially true if your red blood cells have gotten low (anemic). The transfusion raises the level of the red blood cells which carry oxygen, and this usually causes an energy increase.  The nurse administering the transfusion will monitor you carefully for complications. HOME CARE INSTRUCTIONS  No special instructions are needed after a transfusion. You may find your energy is better. Speak with your caregiver about any limitations on activity for underlying diseases you may have. SEEK MEDICAL CARE IF:   Your condition is not improving after your transfusion.  You develop redness or irritation at the intravenous  (IV) site. SEEK IMMEDIATE MEDICAL CARE IF:  Any of the following symptoms occur over the next 12 hours:  Shaking chills.  You have a temperature by mouth above 102 F (38.9 C), not controlled by medicine.  Chest, back, or muscle pain.  People around you feel you are not acting correctly or are confused.  Shortness of breath or difficulty breathing.  Dizziness and fainting.  You get a rash or develop hives.  You have a decrease in urine output.  Your urine turns a dark color or changes to pink, red, or brown. Any of the following symptoms occur over the next 10 days:  You have a temperature by mouth above 102 F (38.9 C), not controlled by medicine.  Shortness of breath.  Weakness after normal activity.  The white part of the eye turns yellow (jaundice).  You have a decrease in the amount of urine or are urinating less often.  Your urine turns a dark color or changes to pink, red, or brown. Document Released: 04/17/2000 Document Revised: 07/13/2011 Document Reviewed: 12/05/2007 ExitCare Patient Information 2014 [place].  _______________________________________________________________________  Incentive Spirometer  An incentive spirometer is a tool that can help keep your lungs clear and active. This tool measures how well you are filling your lungs with each breath. Taking long deep breaths may help reverse or decrease the chance of developing breathing (pulmonary) problems (especially infection) following:  A long period of time when you are unable to move or be active. BEFORE THE PROCEDURE   If the spirometer includes an indicator to show your best effort, your nurse or respiratory therapist will set it to a desired goal.  If possible, sit up straight or lean slightly forward. Try not to slouch.  Hold the incentive spirometer in an upright position. INSTRUCTIONS FOR USE  3. Sit on the edge of your bed if possible, or sit up as far as you can in bed or on  a chair. 4. Hold the incentive spirometer in an upright position. 5. Breathe out normally. 6. Place the mouthpiece in your mouth and seal your lips tightly around it. 7. Breathe in slowly and as deeply as possible,  raising the piston or the ball toward the top of the column. 8. Hold your breath for 3-5 seconds or for as long as possible. Allow the piston or ball to fall to the bottom of the column. 9. Remove the mouthpiece from your mouth and breathe out normally. 10. Rest for a few seconds and repeat Steps 1 through 7 at least 10 times every 1-2 hours when you are awake. Take your time and take a few normal breaths between deep breaths. 11. The spirometer may include an indicator to show your best effort. Use the indicator as a goal to work toward during each repetition. 12. After each set of 10 deep breaths, practice coughing to be sure your lungs are clear. If you have an incision (the cut made at the time of surgery), support your incision when coughing by placing a pillow or rolled up towels firmly against it. Once you are able to get out of bed, walk around indoors and cough well. You may stop using the incentive spirometer when instructed by your caregiver.  RISKS AND COMPLICATIONS  Take your time so you do not get dizzy or light-headed.  If you are in pain, you may need to take or ask for pain medication before doing incentive spirometry. It is harder to take a deep breath if you are having pain. AFTER USE  Rest and breathe slowly and easily.  It can be helpful to keep track of a log of your progress. Your caregiver can provide you with a simple table to help with this. If you are using the spirometer at home, follow these instructions: [place] IF:   You are having difficultly using the spirometer.  You have trouble using the spirometer as often as instructed.  Your pain medication is not giving enough relief while using the spirometer.  You develop fever of 100.5 F  (38.1 C) or higher. SEEK IMMEDIATE MEDICAL CARE IF:   You cough up bloody sputum that had not been present before.  You develop fever of 102 F (38.9 C) or greater.  You develop worsening pain at or near the incision site. MAKE SURE YOU:   Understand these instructions.  Will watch your condition.  Will get help right away if you are not doing well or get worse. Document Released: 08/31/2006 Document Revised: 07/13/2011 Document Reviewed: 11/01/2006 [facility] Patient Information 2014 [place], [place].   ________________________________________________________________________

## 2015-04-11 NOTE — H&P (Signed)
HPI: Patient with stage IV ovarian cancer, scheduled for surgery 04/16/15 with Dr. Denman George. She has a history of acute pulmonary embolism 01/2015 and is on Lovenox. She has been seen by Dr. Denman George and Dr. Marko Plume and is scheduled today for image guided retrievable inferior vena cava filter placement prior to surgery.  The patient has had a H&P performed within the last 30 days, all history, medications, and exam have been reviewed. The patient denies any interval changes since the H&P.  Medications: Prior to Admission medications   Medication Sig Start Date End Date Taking? Authorizing Provider  acetaminophen (TYLENOL) 500 MG tablet Take 1,000 mg by mouth every 6 (six) hours as needed.   Yes Historical Provider, MD  Cholecalciferol (VITAMIN D) 2000 UNITS tablet Take 4,000 Units by mouth daily.   Yes Historical Provider, MD  enoxaparin (LOVENOX) 100 MG/ML injection Inject 0.95 mLs (95 mg total) into the skin daily. 04/01/15  Yes Lennis Marion Downer, MD  ferrous fumarate (HEMOCYTE - 106 MG FE) 325 (106 FE) MG TABS tablet Take 1 tablet daily on an empty stomach with OJ. 04/04/15  Yes Lennis Marion Downer, MD  hydrOXYzine (ATARAX/VISTARIL) 25 MG tablet  03/30/15  Yes Historical Provider, MD  loratadine (CLARITIN) 10 MG tablet Take 10 mg by mouth daily.   Yes Historical Provider, MD  LORazepam (ATIVAN) 1 MG tablet 1 SL or PO every 6 hours as needed for nausea. May use 1-2 tablets  HS as needed 02/25/15  Yes Lennis Marion Downer, MD  Melatonin 2.5 MG CAPS Take 5 mg by mouth at bedtime.   Yes Historical Provider, MD  montelukast (SINGULAIR) 10 MG tablet Take 10 mg by mouth at bedtime.   Yes Historical Provider, MD  naproxen (NAPROSYN) 500 MG tablet Take 500 mg by mouth 2 (two) times daily with a meal.   Yes Historical Provider, MD  ondansetron (ZOFRAN) 8 MG tablet Take 1 tablet (8 mg total) by mouth every 8 (eight) hours as needed for nausea or vomiting. 02/07/15  Yes Melissa D Cross, NP  pantoprazole (PROTONIX) 40 MG  tablet Take 40 mg by mouth daily.   Yes Historical Provider, MD  sennosides-docusate sodium (SENOKOT-S) 8.6-50 MG tablet Take 2 tablets by mouth at bedtime as needed for constipation.   Yes Historical Provider, MD  venlafaxine (EFFEXOR) 100 MG tablet Take 100 mg by mouth 2 (two) times daily.   Yes Historical Provider, MD  dexamethasone (DECADRON) 4 MG tablet Take 5 tabs with food 12 hrs and 6 hrs prior to Taxol  Chemotherapy. Patient not taking: Reported on 04/04/2015 02/26/15   Gordy Levan, MD  LINZESS 145 MCG CAPS capsule Take 1 capsule by mouth. 02/17/15   Historical Provider, MD  sodium chloride (OCEAN) 0.65 % SOLN nasal spray Place 1 spray into both nostrils as needed for congestion. Patient not taking: Reported on 03/14/2015 02/18/15   Lennis Marion Downer, MD  SUMAtriptan (IMITREX) 100 MG tablet Take 100 mg by mouth as needed for migraine. May repeat in 2 hours if headache persists or recurs.    Historical Provider, MD     Vital Signs: BP 112/74 mmHg  Pulse 74  Temp(Src) 98.1 F (36.7 C) (Oral)  Resp 16  SpO2 95%  LMP 01/03/2015  Physical Exam  Constitutional: She is oriented to person, place, and time. No distress.  HENT:  Head: Normocephalic and atraumatic.  Cardiovascular: Normal rate and regular rhythm.  Exam reveals no gallop and no friction rub.   No murmur  heard. Pulmonary/Chest: Effort normal and breath sounds normal. No respiratory distress. She has no wheezes. She has no rales.  Abdominal: Soft. Bowel sounds are normal. She exhibits no distension. There is no tenderness.  Neurological: She is alert and oriented to person, place, and time.  Skin: She is not diaphoretic.    Mallampati Score:  MD Evaluation Airway: WNL Heart: WNL Abdomen: WNL Chest/ Lungs: WNL ASA  Classification: 2 Mallampati/Airway Score: Two  Labs:  CBC:  Recent Labs  03/21/15 1319 03/25/15 0918 04/04/15 1225 04/11/15 0755  WBC 3.1* 5.5 9.8 3.5*  HGB 11.1* 11.5* 10.6* 11.1*  HCT  33.1* 34.7* 31.5* 33.1*  PLT 94* 195 343 165    COAGS:  Recent Labs  04/11/15 0755  INR 1.00  APTT 30    BMP:  Recent Labs  03/14/15 1416 03/21/15 1320 03/25/15 0918 04/11/15 0755  NA 138 138 138 140  K 3.6 4.1 4.0 3.7  CL  --   --   --  105  CO2 27 28 21* 26  GLUCOSE 88 75 211* 89  BUN 8.5 10.6 17.6 10  CALCIUM 8.9 9.4 9.4 9.3  CREATININE 0.8 0.9 1.0 0.88  GFRNONAA  --   --   --  >60  GFRAA  --   --   --  >60    LIVER FUNCTION TESTS:  Recent Labs  03/11/15 1117 03/14/15 1416 03/21/15 1320 03/25/15 0918  BILITOT 0.37 <0.30 0.37 0.37  AST 25 23 18 15   ALT 45 50 27 28  ALKPHOS 91 96 108 104  PROT 6.5 6.1* 6.7 6.8  ALBUMIN 3.8 3.5 3.8 3.9    Assessment/Plan:  Stage IV ovarian cancer, scheduled for surgery 04/16/15 with Dr. Denman George History of acute pulmonary embolism 01/2015 on Lovenox Seen by Dr. Denman George and Dr. Marko Plume  Scheduled today for image guided retrievable inferior vena cava filter placement prior to surgery. The patient has been NPO, no blood thinners taken, labs and vitals have been reviewed. Risks and Benefits discussed with the patient including, but not limited to bleeding, infection, contrast induced renal failure, filter fracture or migration which can lead to emergency surgery or even death, strut penetration with damage or irritation to adjacent structures and caval thrombosis. All of the patient's questions were answered, patient is agreeable to proceed. Consent signed and in chart.   SignedHedy Jacob 04/11/2015, 9:35 AM

## 2015-04-11 NOTE — Discharge Instructions (Signed)
Inferior Vena Cava Filter Insertion Insertion of an inferior vena cava (IVC) filter is a procedure in which a filter is placed into the large vein in your abdomen that carries blood from the lower part of your body to your heart (inferior vena cava). Placement of the filter here helps prevent blood clots in the legs or pelvis from traveling to your lungs. A large blood clot in the lungs can cause death.  The filter is a small, metal device about an inch long. It is shaped like the spokes of an umbrella and is inserted through a pathway created in your neck or groin. The risks of this procedure are usually small and easily managed. Inferior vena cava filters are only used when blood thinners (anticoagulants) cannot be used to prevent blood clots from forming. This may occur because of:  You have severe platelet problems or shortage.  You have had recent or current major bleeding that cannot be treated.  You have bleeding associated with anticoagulants.  You have recurrence of blood clots while on anticoagulants.  You have a need for surgery in the near future.  You have bleeding in your head. EXPECTATIONS OF A FILTER  An IVC filter will reduce the risk of a large blood clot making its way to your lungs (pulmonary embolus, or PE). It cannot eliminate the risk completely, or prevent small PEs from occurring.  It does not stop blood clots from growing, recurring, or developing into postphlebitic syndrome. This is a condition that can occur after there is inflammation in a vein (phlebitis). LET [facility] CARE PROVIDER KNOW ABOUT:  Any allergies you have. This includes an allergy to iodine or contrast dye.  All medicines you are taking, including vitamins, herbs, eye drops, creams and over-the-counter medicines.  Previous problems you or members of your family have had with the use of anesthetics.  Any blood disorders you have.  Previous surgeries you had had.  Medical conditions you  have.  Possibility of pregnancy, if this applies. RISKS AND COMPLICATIONS Generally, this is a safe procedure. However, as with any procedure, problems can occur. Possible problems include:  A small bruise around the needle insertion site. A larger pooling of blood called a hematoma may form. This is usually of no concern.  The filter can block the vena cava. This can cause some swelling of the legs.  The filter may eventually fail and not work properly.  Continued bleeding or infections (uncommon).  Damage to the vein by the catheter (rare). BEFORE THE PROCEDURE  Your health care provider may want you to have blood tests. These tests can help tell how well your kidneys and liver are working. They can also show how well your blood clots.  If you take blood thinners, ask your health care provider if and when you should stop taking them.  Do not eat or drink for 4 hours before the procedure or as directed by your health care provider.  Make arrangements for someone to drive you home. Depending on the procedure, you may be able to go home the same day.  PROCEDURE   Insertion of a Vena Cava filter is performed by a vascular surgeon or cardiologist in a cardiac catheterization lab.  The procedure usually takes about 30 minutes to 1 hour. This can vary.  An IV needle will be inserted into one of your veins. Medicine will be able to flow directly into your body through this needle.  Medicines may given to help you relax  and relieve anxiety (sedative).  The procedure is done through a large vein either in your neck or groin. The skin around this area is cleaned and shaved, as necessary.  The skin and deeper tissues over the vein will be made numb with a local anesthetic. You are awake during the procedure and can let your health care providers know if you have discomfort.  A needle is then put into the vein. A guide wire is placed through the needle and into the vein. This is used to  help insert a catheter and the IVC filter into your vein.  Contrast dye may be injected into the inferior vena cava to help guide the catheter and verify precise placement of the IVC filter. X-ray equipment may also be used to verify that the catheter and the wire are in the correct position.  The wire is then withdrawn.  The IVC filter is then passed over the catheter into the vein, and inserted into the correct location in the vena cava.  The catheter is then removed. Pressure will be kept on the needle insertion point for several minutes or until it is unlikely to bleed. AFTER THE PROCEDURE  You will stay in a recovery area until any sedation medicine you were given has worn off. Your blood pressure and pulse will be checked.  If there are no problems, you should be able to go home after the procedure.  You may feel sore at the area of the needle insertion for a few days.  May shower tomorrow and remove bandaide   This information is not intended to replace advice given to you by your health care provider. Make sure you discuss any questions you have with your health care provider.   Document Released: 06/10/2005 Document Revised: 05/11/2014 Document Reviewed: 12/26/2012 Elsevier Interactive Patient Education 2016 Elsevier                                                                                      Moderate Conscious Sedation, Adult, Care After Refer to this sheet in the next few weeks. These instructions provide you with information on caring for yourself after your procedure. Your health care provider may also give you more specific instructions. Your treatment has been planned according to current medical practices, but problems sometimes occur. Call your health care provider if you have any problems or questions after your procedure. WHAT TO EXPECT AFTER THE PROCEDURE  After your procedure:  You may feel sleepy, clumsy, and have poor balance for several  hours.  Vomiting may occur if you eat too soon after the procedure. HOME CARE INSTRUCTIONS  Do not participate in any activities where you could become injured for at least 24 hours. Do not:  Drive.  Swim.  Ride a bicycle.  Operate heavy machinery.  Cook.  Use power tools.  Climb ladders.  Work from a high place.  Do not make important decisions or sign legal documents until you are improved.  If you vomit, drink water, juice, or soup when you can drink without vomiting. Make sure you have little or no nausea before eating solid foods.  Only take over-the-counter or prescription  medicines for pain, discomfort, or fever as directed by your health care provider.  Make sure you and your family fully understand everything about the medicines given to you, including what side effects may occur.  You should not drink alcohol, take sleeping pills, or take medicines that cause drowsiness for at least 24 hours.  If you smoke, do not smoke without supervision.  If you are feeling better, you may resume normal activities 24 hours after you were sedated.  Keep all appointments with your health care provider. SEEK MEDICAL CARE IF:  Your skin is pale or bluish in color.  You continue to feel nauseous or vomit.  Your pain is getting worse and is not helped by medicine.  You have bleeding or swelling.  You are still sleepy or feeling clumsy after 24 hours. SEEK IMMEDIATE MEDICAL CARE IF:  You develop a rash.  You have difficulty breathing.  You develop any type of allergic problem.  You have a fever. MAKE SURE YOU:  Understand these instructions.  Will watch your condition.  Will get help right away if you are not doing well or get worse.   This information is not intended to replace advice given to you by your health care provider. Make sure you discuss any questions you have with your health care provider.   Document Released: 02/08/2013 Document Revised:  05/11/2014 Document Reviewed: 02/08/2013 Elsevier Interactive Patient Education [employer].

## 2015-04-11 NOTE — Procedures (Signed)
IVCgram: infrarenal mass effect, no clot IVC filter placed, infrarenal, retrievable No complication No blood loss. See complete dictation in [facility]

## 2015-04-12 ENCOUNTER — Ambulatory Visit (HOSPITAL_COMMUNITY)
Admission: RE | Admit: 2015-04-12 | Discharge: 2015-04-12 | Disposition: A | Discharge status: Home / Self Care | Payer: 59 | Source: Ambulatory Visit | Attending: Anesthesiology | Admitting: Anesthesiology

## 2015-04-12 ENCOUNTER — Encounter (HOSPITAL_COMMUNITY)
Admission: RE | Admit: 2015-04-12 | Discharge: 2015-04-12 | Disposition: A | Discharge status: Home / Self Care | Payer: 59 | Source: Ambulatory Visit | Attending: Gynecologic Oncology | Admitting: Gynecologic Oncology

## 2015-04-12 ENCOUNTER — Encounter (HOSPITAL_COMMUNITY): Payer: Self-pay

## 2015-04-12 DIAGNOSIS — Z01812 Encounter for preprocedural laboratory examination: Secondary | ICD-10-CM | POA: Insufficient documentation

## 2015-04-12 DIAGNOSIS — J90 Pleural effusion, not elsewhere classified: Secondary | ICD-10-CM | POA: Diagnosis present

## 2015-04-12 DIAGNOSIS — Z8543 Personal history of malignant neoplasm of ovary: Secondary | ICD-10-CM | POA: Diagnosis not present

## 2015-04-12 DIAGNOSIS — Z01811 Encounter for preprocedural respiratory examination: Secondary | ICD-10-CM | POA: Insufficient documentation

## 2015-04-12 DIAGNOSIS — R140 Abdominal distension (gaseous): Secondary | ICD-10-CM | POA: Insufficient documentation

## 2015-04-12 HISTORY — DX: Pneumonia, unspecified organism: J18.9

## 2015-04-12 HISTORY — DX: Streptococcal pharyngitis: J02.0

## 2015-04-12 HISTORY — DX: Malignant (primary) neoplasm, unspecified: C80.1

## 2015-04-12 LAB — CBC WITH DIFFERENTIAL/PLATELET
BASOS ABS: 0 10*3/uL (ref 0.0–0.1)
BASOS PCT: 0 %
EOS ABS: 0 10*3/uL (ref 0.0–0.7)
EOS PCT: 0 %
HCT: 32.3 % — ABNORMAL LOW (ref 36.0–46.0)
Hemoglobin: 10.6 g/dL — ABNORMAL LOW (ref 12.0–15.0)
Lymphocytes Relative: 23 %
Lymphs Abs: 1.3 10*3/uL (ref 0.7–4.0)
MCH: 29.9 pg (ref 26.0–34.0)
MCHC: 32.8 g/dL (ref 30.0–36.0)
MCV: 91 fL (ref 78.0–100.0)
MONO ABS: 0.3 10*3/uL (ref 0.1–1.0)
Monocytes Relative: 6 %
Neutro Abs: 4 10*3/uL (ref 1.7–7.7)
Neutrophils Relative %: 71 %
PLATELETS: 142 10*3/uL — AB (ref 150–400)
RBC: 3.55 MIL/uL — ABNORMAL LOW (ref 3.87–5.11)
RDW: 17.1 % — AB (ref 11.5–15.5)
WBC: 5.7 10*3/uL (ref 4.0–10.5)

## 2015-04-12 LAB — COMPREHENSIVE METABOLIC PANEL
ALBUMIN: 4.3 g/dL (ref 3.5–5.0)
ALT: 40 U/L (ref 14–54)
AST: 21 U/L (ref 15–41)
Alkaline Phosphatase: 105 U/L (ref 38–126)
Anion gap: 7 (ref 5–15)
BUN: 15 mg/dL (ref 6–20)
CALCIUM: 9.2 mg/dL (ref 8.9–10.3)
CO2: 28 mmol/L (ref 22–32)
CREATININE: 0.84 mg/dL (ref 0.44–1.00)
Chloride: 103 mmol/L (ref 101–111)
GFR calc non Af Amer: >60 mL/min (ref >60)
Glucose, Bld: 77 mg/dL (ref 65–99)
Potassium: 4 mmol/L (ref 3.5–5.1)
Sodium: 138 mmol/L (ref 135–145)
Total Bilirubin: 0.4 mg/dL (ref 0.3–1.2)
Total Protein: 6.9 g/dL (ref 6.5–8.1)

## 2015-04-12 LAB — URINALYSIS, ROUTINE W REFLEX MICROSCOPIC
BILIRUBIN URINE: NEGATIVE
Glucose, UA: NEGATIVE mg/dL
HGB URINE DIPSTICK: NEGATIVE
Ketones, ur: NEGATIVE mg/dL
Leukocytes, UA: NEGATIVE
Nitrite: NEGATIVE
PH: 6 (ref 5.0–8.0)
Protein, ur: NEGATIVE mg/dL
SPECIFIC GRAVITY, URINE: 1.012 (ref 1.005–1.030)

## 2015-04-12 LAB — PREGNANCY, URINE: Preg Test, Ur: NEGATIVE

## 2015-04-12 NOTE — Pre-Procedure Instructions (Addendum)
EKG 04-11-15 epic Labs in epic from 04-11-15, but still need CBC w diff and CMP at pre-op appt.  These labs from 03-25-15 were abnormal, so we will test today at pre-op. Pt CXR on 03-29-15 showed small pleural effusion. Will repeat CXR today at pre-op appt.

## 2015-04-16 ENCOUNTER — Encounter (HOSPITAL_COMMUNITY): Payer: Self-pay | Admitting: *Deleted

## 2015-04-16 ENCOUNTER — Encounter (HOSPITAL_COMMUNITY)
Admission: RE | Disposition: A | Discharge status: Home / Self Care | Payer: Self-pay | Source: Ambulatory Visit | Attending: Gynecologic Oncology

## 2015-04-16 ENCOUNTER — Inpatient Hospital Stay (HOSPITAL_COMMUNITY): Payer: 59 | Admitting: Certified Registered Nurse Anesthetist

## 2015-04-16 ENCOUNTER — Inpatient Hospital Stay (HOSPITAL_COMMUNITY)
Admission: RE | Admit: 2015-04-16 | Discharge: 2015-04-18 | DRG: 737 | Disposition: A | Discharge status: Home / Self Care | Payer: 59 | Source: Ambulatory Visit | Attending: Gynecologic Oncology | Admitting: Gynecologic Oncology

## 2015-04-16 DIAGNOSIS — Z791 Long term (current) use of non-steroidal anti-inflammatories (NSAID): Secondary | ICD-10-CM

## 2015-04-16 DIAGNOSIS — R188 Other ascites: Secondary | ICD-10-CM | POA: Diagnosis present

## 2015-04-16 DIAGNOSIS — F419 Anxiety disorder, unspecified: Secondary | ICD-10-CM | POA: Diagnosis present

## 2015-04-16 DIAGNOSIS — C569 Malignant neoplasm of unspecified ovary: Secondary | ICD-10-CM | POA: Diagnosis present

## 2015-04-16 DIAGNOSIS — Z803 Family history of malignant neoplasm of breast: Secondary | ICD-10-CM

## 2015-04-16 DIAGNOSIS — Z888 Allergy status to other drugs, medicaments and biological substances status: Secondary | ICD-10-CM | POA: Diagnosis not present

## 2015-04-16 DIAGNOSIS — K5900 Constipation, unspecified: Secondary | ICD-10-CM | POA: Diagnosis present

## 2015-04-16 DIAGNOSIS — Z7901 Long term (current) use of anticoagulants: Secondary | ICD-10-CM

## 2015-04-16 DIAGNOSIS — R6881 Early satiety: Secondary | ICD-10-CM | POA: Diagnosis present

## 2015-04-16 DIAGNOSIS — Z86711 Personal history of pulmonary embolism: Secondary | ICD-10-CM | POA: Diagnosis not present

## 2015-04-16 DIAGNOSIS — R140 Abdominal distension (gaseous): Secondary | ICD-10-CM | POA: Diagnosis present

## 2015-04-16 DIAGNOSIS — Z833 Family history of diabetes mellitus: Secondary | ICD-10-CM

## 2015-04-16 DIAGNOSIS — Z808 Family history of malignant neoplasm of other organs or systems: Secondary | ICD-10-CM

## 2015-04-16 DIAGNOSIS — Z79899 Other long term (current) drug therapy: Secondary | ICD-10-CM

## 2015-04-16 DIAGNOSIS — Z87891 Personal history of nicotine dependence: Secondary | ICD-10-CM

## 2015-04-16 DIAGNOSIS — Z806 Family history of leukemia: Secondary | ICD-10-CM

## 2015-04-16 DIAGNOSIS — Z885 Allergy status to narcotic agent status: Secondary | ICD-10-CM | POA: Diagnosis not present

## 2015-04-16 DIAGNOSIS — Z820 Family history of epilepsy and other diseases of the nervous system: Secondary | ICD-10-CM | POA: Diagnosis not present

## 2015-04-16 DIAGNOSIS — Z830 Family history of human immunodeficiency virus [HIV] disease: Secondary | ICD-10-CM

## 2015-04-16 DIAGNOSIS — Z7952 Long term (current) use of systemic steroids: Secondary | ICD-10-CM | POA: Diagnosis not present

## 2015-04-16 HISTORY — DX: Other pulmonary embolism without acute cor pulmonale: I26.99

## 2015-04-16 HISTORY — PX: ABDOMINAL HYSTERECTOMY: SHX81

## 2015-04-16 SURGERY — HYSTERECTOMY, ABDOMINAL
Anesthesia: General | Laterality: Bilateral

## 2015-04-16 MED ORDER — HYDROMORPHONE HCL 2 MG PO TABS
2.0000 mg | ORAL_TABLET | ORAL | Status: DC | PRN
  Administered 2015-04-16 – 2015-04-18 (×5): 2 mg via ORAL
  Filled 2015-04-16 (×7): qty 1

## 2015-04-16 MED ORDER — PROPOFOL 10 MG/ML IV BOLUS
INTRAVENOUS | Status: DC | PRN
  Administered 2015-04-16: 130 mg via INTRAVENOUS

## 2015-04-16 MED ORDER — ACETAMINOPHEN 10 MG/ML IV SOLN
INTRAVENOUS | Status: AC
  Filled 2015-04-16: qty 100

## 2015-04-16 MED ORDER — SODIUM CHLORIDE 0.9 % IJ SOLN
INTRAMUSCULAR | Status: AC
  Filled 2015-04-16: qty 20

## 2015-04-16 MED ORDER — MAGNESIUM HYDROXIDE 400 MG/5ML PO SUSP
30.0000 mL | Freq: Three times a day (TID) | ORAL | Status: AC
  Administered 2015-04-16 – 2015-04-17 (×2): 30 mL via ORAL
  Filled 2015-04-16 (×2): qty 30

## 2015-04-16 MED ORDER — METOCLOPRAMIDE HCL 5 MG/ML IJ SOLN
10.0000 mg | Freq: Four times a day (QID) | INTRAMUSCULAR | Status: DC | PRN
Start: 2015-04-16 — End: 2015-04-18
  Administered 2015-04-16 (×2): 10 mg via INTRAVENOUS
  Filled 2015-04-16 (×2): qty 2

## 2015-04-16 MED ORDER — ENOXAPARIN SODIUM 40 MG/0.4ML ~~LOC~~ SOLN
40.0000 mg | SUBCUTANEOUS | Status: DC
  Administered 2015-04-17 – 2015-04-18 (×2): 40 mg via SUBCUTANEOUS
  Filled 2015-04-16 (×3): qty 0.4

## 2015-04-16 MED ORDER — FENTANYL CITRATE (PF) 250 MCG/5ML IJ SOLN
INTRAMUSCULAR | Status: AC
  Filled 2015-04-16: qty 5

## 2015-04-16 MED ORDER — DEXAMETHASONE SODIUM PHOSPHATE 10 MG/ML IJ SOLN: INTRAMUSCULAR | Status: DC | PRN

## 2015-04-16 MED ORDER — HYDROMORPHONE HCL 1 MG/ML IJ SOLN
0.5000 mg | INTRAMUSCULAR | Status: AC | PRN
  Administered 2015-04-16 (×2): 0.5 mg via INTRAVENOUS
  Filled 2015-04-16 (×2): qty 1

## 2015-04-16 MED ORDER — NEOSTIGMINE METHYLSULFATE 10 MG/10ML IV SOLN
INTRAVENOUS | Status: DC | PRN
  Administered 2015-04-16: 3.5 mg via INTRAVENOUS

## 2015-04-16 MED ORDER — GLYCOPYRROLATE 0.2 MG/ML IJ SOLN
INTRAMUSCULAR | Status: AC
Start: 2015-04-16 — End: 2015-04-16
  Filled 2015-04-16: qty 3

## 2015-04-16 MED ORDER — ENSURE ENLIVE PO LIQD
237.0000 mL | Freq: Two times a day (BID) | ORAL | Status: DC
  Administered 2015-04-17: 237 mL via ORAL

## 2015-04-16 MED ORDER — PROPOFOL 10 MG/ML IV BOLUS
INTRAVENOUS | Status: AC
  Filled 2015-04-16: qty 20

## 2015-04-16 MED ORDER — MIDAZOLAM HCL 5 MG/5ML IJ SOLN
INTRAMUSCULAR | Status: DC | PRN
  Administered 2015-04-16: 2 mg via INTRAVENOUS

## 2015-04-16 MED ORDER — ONDANSETRON HCL 4 MG/2ML IJ SOLN
INTRAMUSCULAR | Status: AC
  Filled 2015-04-16: qty 2

## 2015-04-16 MED ORDER — SODIUM CHLORIDE 0.9 % IJ SOLN
INTRAMUSCULAR | Status: DC | PRN
  Administered 2015-04-16: 10 mL

## 2015-04-16 MED ORDER — HYDROMORPHONE HCL 1 MG/ML IJ SOLN
0.2500 mg | INTRAMUSCULAR | Status: DC | PRN
  Administered 2015-04-16 (×4): 0.5 mg via INTRAVENOUS

## 2015-04-16 MED ORDER — DEXTROSE 5 % IV SOLN
INTRAVENOUS | Status: AC
  Filled 2015-04-16 (×2): qty 2

## 2015-04-16 MED ORDER — MORPHINE SULFATE (PF) 10 MG/ML IV SOLN
1.0000 mg | INTRAVENOUS | Status: DC | PRN
  Administered 2015-04-16 (×2): 2 mg via INTRAVENOUS

## 2015-04-16 MED ORDER — LACTATED RINGERS IV SOLN
INTRAVENOUS | Status: DC | PRN
  Administered 2015-04-16: 11:00:00
  Administered 2015-04-16 (×3): via INTRAVENOUS

## 2015-04-16 MED ORDER — ONDANSETRON HCL 4 MG/2ML IJ SOLN
4.0000 mg | Freq: Four times a day (QID) | INTRAMUSCULAR | Status: DC | PRN
  Administered 2015-04-16: 4 mg via INTRAVENOUS
  Filled 2015-04-16: qty 2

## 2015-04-16 MED ORDER — DEXAMETHASONE SODIUM PHOSPHATE 10 MG/ML IJ SOLN
INTRAMUSCULAR | Status: DC | PRN
  Administered 2015-04-16: 10 mg via INTRAVENOUS

## 2015-04-16 MED ORDER — MORPHINE SULFATE (PF) 4 MG/ML IV SOLN
INTRAVENOUS | Status: AC
  Filled 2015-04-16: qty 1

## 2015-04-16 MED ORDER — 0.9 % SODIUM CHLORIDE (POUR BTL) OPTIME
TOPICAL | Status: DC | PRN
  Administered 2015-04-16: 1000 mL

## 2015-04-16 MED ORDER — HYDROMORPHONE HCL 1 MG/ML IJ SOLN
INTRAMUSCULAR | Status: DC | PRN
  Administered 2015-04-16 (×4): 0.5 mg via INTRAVENOUS

## 2015-04-16 MED ORDER — KETOROLAC TROMETHAMINE 30 MG/ML IJ SOLN: 15.0000 mg | Freq: Four times a day (QID) | INTRAMUSCULAR | Status: AC

## 2015-04-16 MED ORDER — DEXTROSE 5 % IV SOLN
2.0000 g | INTRAVENOUS | Status: AC
  Administered 2015-04-16: 2 g via INTRAVENOUS

## 2015-04-16 MED ORDER — PHENYLEPHRINE 40 MCG/ML (10ML) SYRINGE FOR IV PUSH (FOR BLOOD PRESSURE SUPPORT)
PREFILLED_SYRINGE | INTRAVENOUS | Status: AC
  Filled 2015-04-16: qty 10

## 2015-04-16 MED ORDER — KCL IN DEXTROSE-NACL 20-5-0.45 MEQ/L-%-% IV SOLN
INTRAVENOUS | Status: DC
  Administered 2015-04-16: 20:00:00 via INTRAVENOUS
  Administered 2015-04-16: 50 mL/h via INTRAVENOUS
  Filled 2015-04-16 (×3): qty 1000

## 2015-04-16 MED ORDER — KETOROLAC TROMETHAMINE 30 MG/ML IJ SOLN
15.0000 mg | Freq: Four times a day (QID) | INTRAMUSCULAR | Status: AC
  Administered 2015-04-16 – 2015-04-17 (×4): 15 mg via INTRAVENOUS
  Filled 2015-04-16 (×4): qty 1

## 2015-04-16 MED ORDER — PANTOPRAZOLE SODIUM 40 MG PO TBEC
40.0000 mg | DELAYED_RELEASE_TABLET | Freq: Every day | ORAL | Status: DC
  Administered 2015-04-17 – 2015-04-18 (×2): 40 mg via ORAL
  Filled 2015-04-16 (×3): qty 1

## 2015-04-16 MED ORDER — GABAPENTIN 300 MG PO CAPS
600.0000 mg | ORAL_CAPSULE | Freq: Every day | ORAL | Status: AC
  Administered 2015-04-16: 600 mg via ORAL
  Filled 2015-04-16: qty 2

## 2015-04-16 MED ORDER — ROCURONIUM BROMIDE 100 MG/10ML IV SOLN
INTRAVENOUS | Status: DC | PRN
  Administered 2015-04-16: 20 mg via INTRAVENOUS
  Administered 2015-04-16: 40 mg via INTRAVENOUS
  Administered 2015-04-16 (×3): 10 mg via INTRAVENOUS

## 2015-04-16 MED ORDER — HYDROMORPHONE HCL 1 MG/ML IJ SOLN
INTRAMUSCULAR | Status: AC
  Filled 2015-04-16: qty 2

## 2015-04-16 MED ORDER — HYDROXYZINE HCL 50 MG PO TABS
50.0000 mg | ORAL_TABLET | Freq: Every day | ORAL | Status: DC
  Administered 2015-04-16 – 2015-04-17 (×2): 50 mg via ORAL
  Filled 2015-04-16 (×3): qty 1

## 2015-04-16 MED ORDER — SUCCINYLCHOLINE CHLORIDE 20 MG/ML IJ SOLN
INTRAMUSCULAR | Status: DC | PRN
  Administered 2015-04-16: 100 mg via INTRAVENOUS

## 2015-04-16 MED ORDER — VENLAFAXINE HCL 50 MG PO TABS
100.0000 mg | ORAL_TABLET | Freq: Two times a day (BID) | ORAL | Status: DC
  Administered 2015-04-16 – 2015-04-18 (×4): 100 mg via ORAL
  Filled 2015-04-16 (×5): qty 2

## 2015-04-16 MED ORDER — LIDOCAINE HCL (CARDIAC) 20 MG/ML IV SOLN
INTRAVENOUS | Status: AC
  Filled 2015-04-16: qty 5

## 2015-04-16 MED ORDER — BUPIVACAINE LIPOSOME 1.3 % IJ SUSP
20.0000 mL | Freq: Once | INTRAMUSCULAR | Status: AC
  Administered 2015-04-16: 20 mL
  Filled 2015-04-16: qty 20

## 2015-04-16 MED ORDER — MIDAZOLAM HCL 2 MG/2ML IJ SOLN
INTRAMUSCULAR | Status: AC
  Filled 2015-04-16: qty 2

## 2015-04-16 MED ORDER — ROCURONIUM BROMIDE 100 MG/10ML IV SOLN
INTRAVENOUS | Status: AC
  Filled 2015-04-16: qty 1

## 2015-04-16 MED ORDER — PHENYLEPHRINE 40 MCG/ML (10ML) SYRINGE FOR IV PUSH (FOR BLOOD PRESSURE SUPPORT)
PREFILLED_SYRINGE | INTRAVENOUS | Status: AC
Start: 2015-04-16 — End: 2015-04-16
  Filled 2015-04-16: qty 10

## 2015-04-16 MED ORDER — ONDANSETRON HCL 4 MG PO TABS
4.0000 mg | ORAL_TABLET | Freq: Four times a day (QID) | ORAL | Status: DC | PRN
  Filled 2015-04-16: qty 1

## 2015-04-16 MED ORDER — ACETAMINOPHEN 500 MG PO TABS
1000.0000 mg | ORAL_TABLET | Freq: Four times a day (QID) | ORAL | Status: DC
  Administered 2015-04-16 – 2015-04-18 (×8): 1000 mg via ORAL
  Filled 2015-04-16 (×12): qty 2

## 2015-04-16 MED ORDER — HEMOSTATIC AGENTS (NO CHARGE) OPTIME
TOPICAL | Status: DC | PRN
  Administered 2015-04-16: 1

## 2015-04-16 MED ORDER — NEOSTIGMINE METHYLSULFATE 10 MG/10ML IV SOLN
INTRAVENOUS | Status: AC
  Filled 2015-04-16: qty 1

## 2015-04-16 MED ORDER — LIDOCAINE HCL (CARDIAC) 20 MG/ML IV SOLN
INTRAVENOUS | Status: DC | PRN
  Administered 2015-04-16: 80 mg via INTRAVENOUS

## 2015-04-16 MED ORDER — PHENYLEPHRINE HCL 10 MG/ML IJ SOLN
INTRAMUSCULAR | Status: DC | PRN
  Administered 2015-04-16: 40 ug via INTRAVENOUS

## 2015-04-16 MED ORDER — PROMETHAZINE HCL 25 MG/ML IJ SOLN: 25.0000 mg | Freq: Four times a day (QID) | INTRAMUSCULAR | Status: DC | PRN

## 2015-04-16 MED ORDER — FENTANYL CITRATE (PF) 100 MCG/2ML IJ SOLN
INTRAMUSCULAR | Status: DC | PRN
  Administered 2015-04-16 (×5): 50 ug via INTRAVENOUS

## 2015-04-16 MED ORDER — ACETAMINOPHEN 10 MG/ML IV SOLN
1000.0000 mg | Freq: Once | INTRAVENOUS | Status: AC
  Administered 2015-04-16: 1000 mg via INTRAVENOUS

## 2015-04-16 MED ORDER — GLYCOPYRROLATE 0.2 MG/ML IJ SOLN
INTRAMUSCULAR | Status: DC | PRN
  Administered 2015-04-16: .5 mg via INTRAVENOUS

## 2015-04-16 MED ORDER — IBUPROFEN 800 MG PO TABS
800.0000 mg | ORAL_TABLET | Freq: Four times a day (QID) | ORAL | Status: DC
Start: 2015-04-17 — End: 2015-04-18
  Administered 2015-04-17 – 2015-04-18 (×5): 800 mg via ORAL
  Filled 2015-04-16 (×3): qty 1
  Filled 2015-04-16: qty 4
  Filled 2015-04-16 (×2): qty 1
  Filled 2015-04-16: qty 4
  Filled 2015-04-16 (×3): qty 1

## 2015-04-16 MED ORDER — FENTANYL CITRATE (PF) 100 MCG/2ML IJ SOLN
INTRAMUSCULAR | Status: AC
  Filled 2015-04-16: qty 2

## 2015-04-16 MED ORDER — ONDANSETRON HCL 4 MG/2ML IJ SOLN
INTRAMUSCULAR | Status: DC | PRN
  Administered 2015-04-16: 4 mg via INTRAVENOUS

## 2015-04-16 SURGICAL SUPPLY — 45 items
ATTRACTOMAT 16X20 MAGNETIC DRP (DRAPES) ×2 IMPLANT
BLADE EXTENDED COATED 6.5IN (ELECTRODE) ×2 IMPLANT
CHLORAPREP W/TINT 26ML (MISCELLANEOUS) ×2 IMPLANT
CLIP TI MEDIUM 6 (CLIP) ×4 IMPLANT
CLIP TI MEDIUM LARGE 6 (CLIP) ×1 IMPLANT
CONT SPEC 4OZ CLIKSEAL STRL BL (MISCELLANEOUS) ×2 IMPLANT
COVER SURGICAL LIGHT HANDLE (MISCELLANEOUS) ×1 IMPLANT
DRAPE INCISE IOBAN 66X45 STRL (DRAPES) ×2 IMPLANT
DRAPE WARM FLUID 44X44 (DRAPE) ×2 IMPLANT
DRSG OPSITE POSTOP 4X10 (GAUZE/BANDAGES/DRESSINGS) IMPLANT
DRSG OPSITE POSTOP 4X12 (GAUZE/BANDAGES/DRESSINGS) IMPLANT
DRSG OPSITE POSTOP 4X8 (GAUZE/BANDAGES/DRESSINGS) ×1 IMPLANT
ELECT REM PT RETURN 9FT ADLT (ELECTROSURGICAL) ×2
ELECTRODE REM PT RTRN 9FT ADLT (ELECTROSURGICAL) ×1 IMPLANT
GAUZE SPONGE 4X4 12PLY STRL (GAUZE/BANDAGES/DRESSINGS) ×2 IMPLANT
GAUZE SPONGE 4X4 16PLY XRAY LF (GAUZE/BANDAGES/DRESSINGS) ×1 IMPLANT
GLOVE BIO SURGEON STRL SZ 6 (GLOVE) ×5 IMPLANT
GLOVE BIO SURGEON STRL SZ 6.5 (GLOVE) ×3 IMPLANT
GOWN STRL REUS W/ TWL LRG LVL3 (GOWN DISPOSABLE) ×2 IMPLANT
GOWN STRL REUS W/TWL LRG LVL3 (GOWN DISPOSABLE) ×6
KIT BASIN OR (CUSTOM PROCEDURE TRAY) ×2 IMPLANT
LIGASURE IMPACT 36 18CM CVD LR (INSTRUMENTS) ×1 IMPLANT
LIQUID BAND (GAUZE/BANDAGES/DRESSINGS) ×1 IMPLANT
NS IRRIG 1000ML POUR BTL (IV SOLUTION) ×8 IMPLANT
PACK GENERAL/GYN (CUSTOM PROCEDURE TRAY) ×2 IMPLANT
SHEET LAVH (DRAPES) ×2 IMPLANT
SPONGE LAP 18X18 X RAY DECT (DISPOSABLE) IMPLANT
STAPLER VISISTAT 35W (STAPLE) ×2 IMPLANT
SURGIFLO W/THROMBIN 8M KIT (HEMOSTASIS) ×1 IMPLANT
SUT MNCRL AB 4-0 PS2 18 (SUTURE) ×2 IMPLANT
SUT PDS AB 1 TP1 96 (SUTURE) ×4 IMPLANT
SUT PROLENE 5 0 CC 1 (SUTURE) IMPLANT
SUT VIC AB 0 CT1 36 (SUTURE) ×6 IMPLANT
SUT VIC AB 2-0 CT1 36 (SUTURE) ×4 IMPLANT
SUT VIC AB 2-0 CT2 27 (SUTURE) ×17 IMPLANT
SUT VIC AB 2-0 SH 27 (SUTURE) ×4
SUT VIC AB 2-0 SH 27X BRD (SUTURE) ×2 IMPLANT
SUT VIC AB 3-0 SH 27 (SUTURE) ×2
SUT VIC AB 3-0 SH 27X BRD (SUTURE) ×1 IMPLANT
SUT VICRYL 2 0 18  UND BR (SUTURE) ×1
SUT VICRYL 2 0 18 UND BR (SUTURE) ×1 IMPLANT
TOWEL OR 17X26 10 PK STRL BLUE (TOWEL DISPOSABLE) ×4 IMPLANT
TOWEL OR NON WOVEN STRL DISP B (DISPOSABLE) ×2 IMPLANT
TRAY FOLEY W/METER SILVER 14FR (SET/KITS/TRAYS/PACK) ×2 IMPLANT
TRAY FOLEY W/METER SILVER 16FR (SET/KITS/TRAYS/PACK) ×1 IMPLANT

## 2015-04-16 NOTE — Transfer of Care (Signed)
Immediate Anesthesia Transfer of Care Note  Patient: Elizabeth Henry  Procedure(s) Performed: Procedure(s): HYSTERECTOMY ABDOMINAL TOTAL, EXPLORATORY LAPAROTOMY, BILATERAL SALPINGO OOPHERECTOMY, OMENTECTOMY, DEBULKING (Bilateral)  Patient Location: PACU  Anesthesia Type:General  Level of Consciousness:  sedated, patient cooperative and responds to stimulation  Airway & Oxygen Therapy:Patient Spontanous Breathing and Patient connected to face mask oxgen  Post-op Assessment:  Report given to PACU RN and Post -op Vital signs reviewed and stable  Post vital signs:  Reviewed and stable  Last Vitals:  Filed Vitals:   04/16/15 0510  BP: 118/77  Pulse: 83  Temp: 36.7 C  Resp: 18    Complications: No apparent anesthesia complications

## 2015-04-16 NOTE — Op Note (Signed)
OPERATIVE NOTE 04/16/15  Preoperative Diagnosis: Stage IV ovarian cancer   Postoperative Diagnosis:same    Procedure(s) Performed: Exploratory laparotomy with total abdominal hysterectomy bilateral salpingo-oophorectomy, omentectomy radical tumor debulking for ovarian cancer  Surgeon: Thereasa Solo, MD.  Assistant Surgeon: Lahoma Crocker, M.D. Assistant: (an MD assistant was necessary for tissue manipulation, retraction and positioning due to the complexity of the case and hospital policies).   Specimens: Uterus, bilateral tubes / ovaries, omentum.    Estimated Blood Loss: 350 mL.    Urine [id number]  Complications: None.   Operative Findings: 20cm right ovarian cystic neoplasma, left ovary with tumor measuring approximately 10cm. Tumors non-adherent to pelvic structures. No peritoneal nodularity or plaques, grossly normal omentum. No enlarged lymph nodes. Small volume (100cc) ascites. Normal diaphragms.    This represented an optimal cytoreduction (R0) with no gross visible disease remaining (in the peritoneal cavity).  Procedure:   The patient was seen in the Holding Room. The risks, benefits, complications, treatment options, and expected outcomes were discussed with the patient.  The patient concurred with the proposed plan, giving informed consent.   The patient was  identified as Elizabeth Henry  and the procedure verified as TAHBSO, omentectomy, tumor debulking. A Time Out was held and the above information confirmed upon entry to the operating room..  After induction of anesthesia, the patient was draped and prepped in the usual sterile manner.  She was prepped and draped in the normal sterile fashion in the dorsal lithotomy position in padded Allen stirrups with good attention paid to support of the lower back and lower extremities. Position was adjusted for appropriate support. A Foley catheter was placed to gravity.   A midline vertical incision was made and carried  through the subcutaneous tissue to the fascia. The fascial incision was made and extended superiorally. The rectus muscles were separated. The peritoneum was identified and entered. Peritoneal incision was extended longitudinally.  The abdominal cavity was entered sharply and without incident. A Bookwalter retractor was then placed. A survey of the abdomen and pelvis revealed the above findings, which were significant for bilateral ovarian masses (predominantly cystic) but no other major tumor in peritoneal cavity. She demonstrated an excellent response to neoadjuvant chemotherapy.  The right ovary was delivered through the incision. It's IP and utero-ovarian ligaments were cross clamped, and transected. The pedicles were made hemostatic with suture ligasures. A similar process was used to exturpate the left ovarian tumor. The bookwalter retractor was positioned and care was used to ensure that there were no pressure points with the blades on the psoas muscles bellies.   After packing the small bowel into the upper abdomen, we performed the hysterectomy by incising the right round ligament. The pararectal space was developed and the retroperitoneum developed up to the level of the common iliac artery.  The course of the ureter was identified with ease. The residual right IP was then skeletonized, and sealed and transected at the pelvic brim with ligasure with visualization of the ureter at all times.   The sigmoid colon was dissected from its dense tumor attachments to the left residual IP ligament using sharp dissection. The left retroperitoneal peritoneum was entered parallel to the sigmoid colon attachments and the left ureter was identified in the left retroperitoneal space. Using sharp and monopolar dissection, the left residual IP ligament was was freed from their peritoneal adhesions to the pelvis and sigmoid colon. The IP ligament was sealed with ligasure and transected. The posterior leaves of the  broad  ligament were incised above the level of the ureters to the uterine isthmus to skeletonize the posterior uterine vessels.  The anterior peritoneal reflection was incised and using meticulous blunt and short bursts of monopolar energy, the bladder flap was reflected below the cervico vaginal junction. Curved heaney clamps were placed across the uterine vessels bilaterally at the uterine isthmus. The pedicles were transected and suture ligated. Successive straight haeney clamps were passed down the parametrium and cardinal ligaments and these pedicles were sharply transected and suture ligated. At the cervico-vaginal junction curved clamps were placed across the angles. The uterine and cervix specimen was transected at the upper vagina. The corners were secured with 0-vicryl figure of 8 sutures and the vaginal cuff was closed in a hemostatic fashion with interrupted figure of 8 sutures.  The omentum was dissected free from the transverse colon from the hepatic flexure to the splenic flexure using sharp metzenbaum scissor dissection. The lesser sac was entered. The short gastric vessels were sealed with ligasure and the infragastric omentum was separated from the greater curvature of the stomach removing. Hemostasis was confirmed. The colon was closely inspected and was noted to be intact and hemostatic.  The peritoneal cavity was irrigated and hemostasis was confirmed at all surgical sites.  No visible residual tumor was identified.   The fascia was reapproximated with #1 looped PDS using a total of two sutures. The subcutaneous layer was then irrigated copiously.  Exparel long acting local anesthetic was infiltrated into the subcutaneous tissues. The skin was closed with sub Q suture. The patient tolerated the procedure well.   Sponge, lap and needle counts were correct x 2.   Donaciano Eva, MD

## 2015-04-16 NOTE — H&P (View-Only) (Signed)
Consult Note: Gyn-Onc  Consult was requested by Dr. Kennon Rounds for the evaluation of Elizabeth Henry 44 y.o. female  CC:  Chief Complaint  Patient presents with  . ovarian mass    follow up    Assessment/Plan:  Elizabeth Henry  is a 44 y.o.  year old with stage IV ovarian cancer in the setting of acute PE, s/p 3 cycles of neoadjuvant chemotherapy. Demonstrating partial response to therapy. Measurable disease is still present in the large dominant ovarian cystic mass, however peritoneal nodularity has resolved as has PE.  Will obtain IVC filter.  Plan for interval debulking (TAH, BSO, omentectomy, radical debulking) on 04/16/15 when she is 21+ days post day 1 of cycle 3.  Discussed operative risks with patient, particularly those of bleeding complications. I discussed that these are elevated in a patient with recent PE on anticoagulation. Discussed possibility of risk of needing bowel resection. Discussed anticipated hospital length of stay and recovery.  Patient will likely resume chemotherapy 3 weeks postop.  Her last dose of preop lovenox will be on the morning prior to surgery.   HPI: Elizabeth Henry is a very pleasant 44 year old G2P2 who was initially seen in consultation at the request of Dr Kennon Rounds for bilateral  Ovarian masses, ascites, elevated CA-125, and abnormal uterine bleeding. The patient began experiencing abdominal bloating and early satiety 6 weeks ago in July 2016. She since the end of August 2016 she has experienced vaginal bleeding every day that is light. She denies pain other than stretching pain in her abdomen. She's had some constipation but no change in the caliber of his stools and no blood in her bowel movements.  She was evaluated by Dr. Kennon Rounds on 01/25/2015. A pelvic and abdominal ultrasound scan were ordered and revealed large volume of ascites in the abdomen, bilateral ovarian masses with the right measuring 24.3 x 13.1 x 25.3 cm with solid and cystic  components and blood flow within the solid components. The left ovary measured 14.5 x 12.7 x 14.8 cm and containing solid cystic components with increased blood flow. The endometrial stripe was slightly thickened at 9 mm (cc patient is premenopausal). A CA-125 was drawn on 01/28/2015 and was elevated at 3142 units per milliliter.  A CT chest was performed on 01/31/15 which showed an acute PE and pleural effusions.  She was started on therapeutic lovenox.  She was started on neoadjuvant chemotherapy (due to her acute PE and the concern about discontinuing anticoagulation for surgery in the acute setting).  Prior to starting chemotherapy she underwent paracentesis and thoracentesis (right) both of which confirmed metastatic adencarcinoma consistent with gyn primary.  Interval Hx: On 02/12/15 she began chemotherapy with paclitaxel and carboplatin. She has tolerated this well with no dose delays. She feels generally much better with resolution of ascites. She is eating well.  Day 1 of cycle 3 was 03/25/15.  CA 125 has decreased to 713 on 03/21/15.  The patient is otherwise a very healthy woman. She has no chronic medical complaints other than anxiety. She has a significant family history for a mother who died from breast cancer at age 54, and a maternal grandmother who died from cancer of unknown source at a premenopausal age.   Elizabeth Henry has not had prior abdominal surgeries. She has history of 2 vaginal deliveries. She has a remote history of abnormal smears but Pap smears in recent years have all been normal.  Current Meds:  Outpatient Encounter Prescriptions as of 04/01/2015  Medication Sig  . Cholecalciferol (VITAMIN D) 2000 UNITS tablet Take 4,000 Units by mouth daily.  Marland Kitchen dexamethasone (DECADRON) 4 MG tablet Take 5 tabs with food 12 hrs and 6 hrs prior to Taxol  Chemotherapy.  . enoxaparin (LOVENOX) 80 MG/0.8ML injection Inject 0.7 mLs (70 mg total) into the skin every 12 (twelve) hours.  .  hydrOXYzine (ATARAX/VISTARIL) 25 MG tablet   . LINZESS 145 MCG CAPS capsule Take 1 capsule by mouth.  Marland Kitchen LORazepam (ATIVAN) 1 MG tablet 1 SL or PO every 6 hours as needed for nausea. May use 1-2 tablets  HS as needed  . Melatonin 2.5 MG CAPS Take 5 mg by mouth at bedtime.  . montelukast (SINGULAIR) 10 MG tablet Take 10 mg by mouth at bedtime.  . naproxen (NAPROSYN) 500 MG tablet Take 500 mg by mouth 2 (two) times daily with a meal.  . pantoprazole (PROTONIX) 40 MG tablet Take 40 mg by mouth daily.  Marland Kitchen venlafaxine (EFFEXOR) 100 MG tablet Take 100 mg by mouth 2 (two) times daily.  Marland Kitchen enoxaparin (LOVENOX) 100 MG/ML injection Inject 0.95 mLs (95 mg total) into the skin daily. (Patient not taking: Reported on 04/01/2015)  . loratadine (CLARITIN) 10 MG tablet Take 10 mg by mouth daily.  . ondansetron (ZOFRAN) 8 MG tablet Take 1 tablet (8 mg total) by mouth every 8 (eight) hours as needed for nausea or vomiting. (Patient not taking: Reported on 04/01/2015)  . sodium chloride (OCEAN) 0.65 % SOLN nasal spray Place 1 spray into both nostrils as needed for congestion. (Patient not taking: Reported on 03/14/2015)  . SUMAtriptan (IMITREX) 100 MG tablet Take 100 mg by mouth as needed for migraine. May repeat in 2 hours if headache persists or recurs.  . [DISCONTINUED] hydrOXYzine (VISTARIL) 25 MG capsule Take 25 mg by mouth at bedtime.  . [DISCONTINUED] prochlorperazine (COMPAZINE) 10 MG tablet Take 1 tablet (10 mg total) by mouth every 6 (six) hours as needed for nausea or vomiting.  . [DISCONTINUED] tiZANidine (ZANAFLEX) 4 MG capsule Take 4 mg by mouth at bedtime.  . [DISCONTINUED] traMADol (ULTRAM) 50 MG tablet Take 1-2 tablets (50-100 mg total) by mouth every 8 (eight) hours as needed (pain).   No facility-administered encounter medications on file as of 04/01/2015.    Allergy:  Allergies  Allergen Reactions  . Ambien [Zolpidem Tartrate] Swelling  . Compazine [Prochlorperazine Edisylate] Hives  . Tylox  [Oxycodone-Acetaminophen] Hives    Social Hx:   Social History   Social History  . Marital Status: Married    Spouse Name: N/A  . Number of Children: 2  . Years of Education: 16   Occupational History  . Self-employed    Social History Main Topics  . Smoking status: Former Smoker -- 1.00 packs/day for 10 years    Types: Cigarettes    Quit date: 10/18/2002  . Smokeless tobacco: Never Used     Comment: smoked on and off for 10 yrs total - up to 1 ppd at most  . Alcohol Use: No     Comment: very rarely  . Drug Use: No  . Sexual Activity: Not Currently   Other Topics Concern  . Not on file   Social History Narrative   Fun: Go to [employer]; crochet   Denies religious beliefs effecting health care.     Past Surgical Hx: History reviewed. No pertinent past surgical history.  Past Medical Hx:  Past Medical History  Diagnosis Date  . Anxiety   .  Allergy   . Migraines   . GERD (gastroesophageal reflux disease)     Past Gynecological History:  Remote hx of abn paps.  No LMP recorded.  Family Hx:  Family History  Problem Relation Age of Onset  . Breast cancer Mother 38  . Healthy Father   . Breast cancer Maternal Grandmother     dx. 40 or younger  . Brain cancer Paternal Grandmother     unspecified tumor type  . HIV Brother   . Leukemia Paternal Uncle   . HIV Brother   . Alzheimer's disease Maternal Grandfather   . Diabetes Maternal Grandfather   . Breast cancer Other   . Cancer Paternal Uncle     unspecified type    Review of Systems:  Constitutional  Feels distended  ENT Normal appearing ears and nares bilaterally Skin/Breast  No rash, sores, jaundice, itching, dryness Cardiovascular  No chest pain, shortness of breath, or edema  Pulmonary  No cough or wheeze.  Gastro Intestinal  No nausea, vomitting, or diarrhoea.  Genito Urinary  No frequency, urgency, dysuria, no vaginal bleeding Musculo Skeletal  No myalgia, arthralgia, joint swelling or  pain  Neurologic  No weakness, numbness, change in gait,  Psychology  No depression, anxiety, insomnia.   Vitals:  Blood pressure 120/75, pulse 84, temperature 98 F (36.7 C), temperature source Oral, resp. rate 18, height 5\' 3"  (1.6 m), weight 148 lb 9.6 oz (67.405 kg), SpO2 100 %.  Physical Exam: WD in NAD Neck  Supple NROM, without any enlargements.  Lymph Node Survey No cervical supraclavicular or inguinal adenopathy Cardiovascular  Pulse normal rate, regularity and rhythm. S1 and S2 normal.  Lungs  Clear to auscultation bilateraly, without wheezes/crackles/rhonchi. Good air movement.  Skin  No rash/lesions/breakdown  Psychiatry  Alert and oriented to person, place, and time  Abdomen  Normoactive bowel sounds, abdomen mildly and distended with large cystic mass, non-tender and thin without evidence of hernia.  Back No CVA tenderness Genito Urinary  Vulva/vagina: Normal external female genitalia.   No lesions. No discharge or bleeding.  Bladder/urethra:  No lesions or masses, well supported bladder  Vagina: normal  Cervix: Normal appearing, no lesions.  Uterus:  Small, mobile, no parametrial involvement or nodularity.  Adnexa: no discretely palpable masses in the pelvis, though fullness beyond is appreciated. Rectal  Good tone, no masses no cul de sac nodularity.  Extremities  No bilateral cyanosis, clubbing or edema.    Donaciano Eva, MD  04/01/2015, 1:15 PM

## 2015-04-16 NOTE — Anesthesia Postprocedure Evaluation (Signed)
Anesthesia Post Note  Patient: Elizabeth Henry  Procedure(s) Performed: Procedure(s) (LRB): HYSTERECTOMY ABDOMINAL TOTAL, EXPLORATORY LAPAROTOMY, BILATERAL SALPINGO OOPHERECTOMY, OMENTECTOMY, DEBULKING (Bilateral)  Patient location during evaluation: PACU Anesthesia Type: General Level of consciousness: awake and alert Pain management: pain level controlled Vital Signs Assessment: post-procedure vital signs reviewed and stable Respiratory status: spontaneous breathing, nonlabored ventilation, respiratory function stable and patient connected to nasal cannula oxygen Cardiovascular status: blood pressure returned to baseline and stable Postop Assessment: no signs of nausea or vomiting Anesthetic complications: no    Last Vitals:  Filed Vitals:   04/16/15 1045 04/16/15 1053  BP: 143/89 136/91  Pulse: 86 86  Temp:    Resp: 14 12    Last Pain:  Filed Vitals:   04/16/15 1057  PainSc: 4                  Neelam Tiggs,W. EDMOND

## 2015-04-16 NOTE — Anesthesia Preprocedure Evaluation (Addendum)
Anesthesia Evaluation  Patient identified by MRN, date of birth, ID band Patient awake    Reviewed: Allergy & Precautions, H&P , NPO status , Patient's Chart, lab work & pertinent test results  Airway Mallampati: II  TM Distance: >3 FB Neck ROM: Full    Dental no notable dental hx. (+) Teeth Intact, Dental Advisory Given   Pulmonary neg pulmonary ROS, former smoker,    Pulmonary exam normal breath sounds clear to auscultation       Cardiovascular negative cardio ROS   Rhythm:Regular Rate:Normal     Neuro/Psych  Headaches, Anxiety negative neurological ROS     GI/Hepatic Neg liver ROS, GERD  Controlled,  Endo/Other  negative endocrine ROS  Renal/GU negative Renal ROS   Ovarian CA    Musculoskeletal   Abdominal   Peds  Hematology negative hematology ROS (+)   Anesthesia Other Findings   Reproductive/Obstetrics negative OB ROS                            Anesthesia Physical Anesthesia Plan  ASA: III  Anesthesia Plan: General   Post-op Pain Management:    Induction: Intravenous  Airway Management Planned: Oral ETT  Additional Equipment:   Intra-op Plan:   Post-operative Plan: Extubation in OR  Informed Consent: I have reviewed the patients History and Physical, chart, labs and discussed the procedure including the risks, benefits and alternatives for the proposed anesthesia with the patient or authorized representative who has indicated his/her understanding and acceptance.   Dental advisory given  Plan Discussed with: CRNA  Anesthesia Plan Comments:         Anesthesia Quick Evaluation

## 2015-04-16 NOTE — Anesthesia Procedure Notes (Signed)
Procedure Name: Intubation Date/Time: 04/16/2015 7:45 AM Performed by: Montel Clock Pre-anesthesia Checklist: Patient identified, Emergency Drugs available, Suction available, Patient being monitored and Timeout performed Patient Re-evaluated:Patient Re-evaluated prior to inductionOxygen Delivery Method: Circle system utilized Preoxygenation: Pre-oxygenation with 100% oxygen Intubation Type: IV induction Ventilation: Mask ventilation without difficulty Laryngoscope Size: Mac and 3 Grade View: Grade I Tube type: Oral Tube size: 7.0 mm Number of attempts: 1 Airway Equipment and Method: Stylet Placement Confirmation: ETT inserted through vocal cords under direct vision,  positive ETCO2 and breath sounds checked- equal and bilateral Secured at: 21 cm Tube secured with: Tape Dental Injury: Teeth and Oropharynx as per pre-operative assessment

## 2015-04-16 NOTE — Interval H&P Note (Signed)
History and Physical Interval Note:  04/16/2015 7:23 AM  Elizabeth Henry  has presented today for surgery, with the diagnosis of ovarian cancer  The various methods of treatment have been discussed with the patient and family. After consideration of risks, benefits and other options for treatment, the patient has consented to  Procedure(s): HYSTERECTOMY ABDOMINAL TOTAL, EXPLORATORY LAPAROTOMY, BILATERAL SALPINGO OOPHERECTOMY, OMENTECTOMY, DEBULKING (Bilateral) as a surgical intervention .  The patient's history has been reviewed, patient examined, no change in status, stable for surgery.  I have reviewed the patient's chart and labs.  Questions were answered to the patient's satisfaction.     Donaciano Eva

## 2015-04-17 LAB — BASIC METABOLIC PANEL
ANION GAP: 8 (ref 5–15)
BUN: 9 mg/dL (ref 6–20)
CALCIUM: 8.8 mg/dL — AB (ref 8.9–10.3)
CO2: 29 mmol/L (ref 22–32)
CREATININE: 0.89 mg/dL (ref 0.44–1.00)
Chloride: 102 mmol/L (ref 101–111)
GFR calc Af Amer: >60 mL/min (ref >60)
GLUCOSE: 132 mg/dL — AB (ref 65–99)
Potassium: 4.1 mmol/L (ref 3.5–5.1)
Sodium: 139 mmol/L (ref 135–145)

## 2015-04-17 LAB — CBC
HCT: 27.3 % — ABNORMAL LOW (ref 36.0–46.0)
Hemoglobin: 9.3 g/dL — ABNORMAL LOW (ref 12.0–15.0)
MCH: 31.3 pg (ref 26.0–34.0)
MCHC: 34.1 g/dL (ref 30.0–36.0)
MCV: 91.9 fL (ref 78.0–100.0)
PLATELETS: 123 10*3/uL — AB (ref 150–400)
RBC: 2.97 MIL/uL — ABNORMAL LOW (ref 3.87–5.11)
RDW: 17.8 % — AB (ref 11.5–15.5)
WBC: 10.8 10*3/uL — AB (ref 4.0–10.5)

## 2015-04-17 LAB — GLUCOSE, CAPILLARY: Glucose-Capillary: 108 mg/dL — ABNORMAL HIGH (ref 65–99)

## 2015-04-17 MED ORDER — SIMETHICONE 80 MG PO CHEW
160.0000 mg | CHEWABLE_TABLET | Freq: Four times a day (QID) | ORAL | Status: DC | PRN
  Administered 2015-04-17: 160 mg via ORAL
  Filled 2015-04-17 (×2): qty 2

## 2015-04-17 NOTE — Progress Notes (Signed)
1 Day Post-Op Procedure(s) (LRB): HYSTERECTOMY ABDOMINAL TOTAL, EXPLORATORY LAPAROTOMY, BILATERAL SALPINGO OOPHERECTOMY, OMENTECTOMY, DEBULKING (Bilateral)  Subjective: Patient reports feeling sleepy this am.  Tolerating liquids including Ensure but has not ate breakfast due to decreased appetite.  No nausea or emesis reported.  Minimal pain reported.  Up with assistance.  Due to void.  Denies chest pain, dyspnea, passing flatus, or having a bowel movement.  No concerns voiced..    Objective: Vital signs in last 24 hours: Temp:  [98.1 F (36.7 C)-99.2 F (37.3 C)] 98.1 F (36.7 C) (12/14 0954) Pulse Rate:  [78-87] 84 (12/14 0954) Resp:  [14-16] 16 (12/14 0954) BP: (98-127)/(62-86) 98/62 mmHg (12/14 0954) SpO2:  [96 %-100 %] 96 % (12/14 0954) Last BM Date: 04/16/15  Intake/Output from previous day: 12/13 0701 - 12/14 0700 In: 4372.5 [P.O.:360; I.V.:3912.5; IV Piggyback:100] Out: 2360 [Urine:2010; Blood:350]  Physical Examination: General: cooperative, no distress and sleeping, responsive to verbal stimuli Resp: clear to auscultation bilaterally Cardio: regular rate and rhythm, S1, S2 normal, no murmur, click, rub or gallop GI: soft, non-tender; bowel sounds normal; no masses,  no organomegaly and incision: midline incision with staples, honeycomb dressing in place and intact, no drainage or erythema noted Extremities: extremities normal, atraumatic, no cyanosis or edema  Labs: WBC/Hgb/Hct/Plts:  10.8/9.3/27.3/123 (12/14 0415) BUN/Cr/glu/ALT/AST/amyl/lip:  9/0.89/--/--/--/--/-- (12/14 [id number])  Assessment: 44 y.o. s/p Procedure(s): HYSTERECTOMY ABDOMINAL TOTAL, EXPLORATORY LAPAROTOMY, BILATERAL SALPINGO OOPHERECTOMY, OMENTECTOMY, DEBULKING: stable Pain:  Pain is well-controlled on PRN medications.  Heme: Hgb 9.3 and Hct 27.3 this am.  Stable post-operatively.    CV: BP and HR stable post-operatively.  Continue to monitor with ordered vital signs.  GI:  Tolerating po: Yes, but  decreased appetite.  GU: Due to void, adequate output reported.  FEN: Stable post-operatively.  Prophylaxis: pharmacologic prophylaxis (with any of the following: enoxaparin (Lovenox) 40mg  SQ 2 hours prior to surgery then every day) and intermittent pneumatic compression boots.  Plan: Advance diet as tolerated Discontinue foley Saline lock IV Encourage ambulation, Is use, deep breathing, and coughing Continue post-operative plan of care per Dr. Delsa Sale  The patient is to be discharged to home.   LOS: 1 day    CROSS, MELISSA DEAL 04/17/2015, 12:12 PM

## 2015-04-17 NOTE — Progress Notes (Signed)
Patient ID: Elizabeth Henry, female   DOB: [birth year], 44 y.o.   MRN: [id number] 1 Day Post-Op Procedure(s) (LRB): HYSTERECTOMY ABDOMINAL TOTAL, EXPLORATORY LAPAROTOMY, BILATERAL SALPINGO OOPHERECTOMY, OMENTECTOMY, DEBULKING (Bilateral)  Subjective: Patient reports small bulge near umbilicus.  No other complaints    Objective: Vital signs in last 24 hours: Temp:  [97.5 F (36.4 C)-99.2 F (37.3 C)] 98.8 F (37.1 C) (12/14 0140) Pulse Rate:  [75-102] 79 (12/14 0140) Resp:  [12-18] 16 (12/14 0140) BP: (105-143)/(64-91) 105/64 mmHg (12/14 0140) SpO2:  [100 %] 100 % (12/14 0140) Weight:  [148 lb (67.132 kg)] 148 lb (67.132 kg) (12/13 1111) Last BM Date: 04/16/15  Intake/Output from previous day: 12/13 0701 - 12/14 0700 In: 4372.5 [P.O.:360; I.V.:3912.5; IV Piggyback:100] Out: 2360 [Urine:2010; Blood:350]  Physical Examination: General: alert Resp: clear to auscultation bilaterally Cardio: regular rate and rhythm, S1, S2 normal, no murmur, click, rub or gallop GI: incision: clean, dry and intact; normoactive BS, softly distended Extremities: extremities normal, atraumatic, no cyanosis or edema Vaginal Bleeding: none  Labs: WBC/Hgb/Hct/Plts:  10.8/9.3/27.3/123 (12/14 0415) BUN/Cr/glu/ALT/AST/amyl/lip:  9/0.89/--/--/--/--/-- (12/14 [id number])   Assessment:  44 y.o. s/p Procedure(s): HYSTERECTOMY ABDOMINAL TOTAL, EXPLORATORY LAPAROTOMY, BILATERAL SALPINGO OOPHERECTOMY, OMENTECTOMY, DEBULKING: stable Pain:  Pain is well-controlled on  oral medications.  Heme: Anemia--stable; Hgb c/w intraoperative blood loss  GI:  Tolerating po: Yes    Treatment for N/V: zofran intravenous/oral; reglan.  FEN: Electrolytes in range  Prophylaxis: pharmacologic prophylaxis (with any of the following: enoxaparin (Lovenox) 40mg  SQ 2 hours prior to surgery then every day), intermittent pneumatic compression boots and h/o recent PE w/IVC filter.  Plan: >advance diet as tolerated >ambulate/IS >saline  lock IV; D/C foley catheter >possibly resume full anticoagulation tomorrow    LOS: 1 day    JACKSON-MOORE,Dezmen Alcock A 04/17/2015, 9:09 AM

## 2015-04-18 ENCOUNTER — Telehealth: Payer: Self-pay | Admitting: Oncology

## 2015-04-18 LAB — CBC
HEMATOCRIT: 25.1 % — AB (ref 36.0–46.0)
HEMOGLOBIN: 8.4 g/dL — AB (ref 12.0–15.0)
MCH: 31.2 pg (ref 26.0–34.0)
MCHC: 33.5 g/dL (ref 30.0–36.0)
MCV: 93.3 fL (ref 78.0–100.0)
Platelets: 109 10*3/uL — ABNORMAL LOW (ref 150–400)
RBC: 2.69 MIL/uL — AB (ref 3.87–5.11)
RDW: 18.1 % — ABNORMAL HIGH (ref 11.5–15.5)
WBC: 8.1 10*3/uL (ref 4.0–10.5)

## 2015-04-18 MED ORDER — HYDROMORPHONE HCL 2 MG PO TABS: 2.0000 mg | ORAL_TABLET | ORAL | Status: DC | PRN

## 2015-04-18 MED ORDER — SENNOSIDES-DOCUSATE SODIUM 8.6-50 MG PO TABS: 1.0000 | ORAL_TABLET | Freq: Every day | ORAL | Status: DC

## 2015-04-18 MED ORDER — ESTRADIOL 0.5 MG PO TABS: 0.5000 mg | ORAL_TABLET | Freq: Every day | ORAL | Status: DC

## 2015-04-18 MED ORDER — ACETAMINOPHEN 500 MG PO TABS: 1000.0000 mg | ORAL_TABLET | Freq: Four times a day (QID) | ORAL | Status: DC

## 2015-04-18 NOTE — Progress Notes (Signed)
Discussed with patient discharge instructions, she verbalized agreement and understanding.  Patient's IV was discontinued with no complications.  Patient to go down in wheelchair with all belongings to go home in private.

## 2015-04-18 NOTE — Telephone Encounter (Signed)
Called in a requested Mammo from [facility]

## 2015-04-18 NOTE — Discharge Summary (Signed)
Physician Discharge Summary  Patient ID: Elizabeth Henry MRN: [id number] DOB/AGE: July 01, 1970 44 y.o.  Admit date: 04/16/2015 Discharge date: 04/18/2015  Admission Diagnoses: Ovarian cancer [facility])  Discharge Diagnoses:  Principal Problem:   Ovarian cancer ([place]) Active Problems:   Epithelial ovarian cancer, FIGO stage IVA [facility])   Discharged Condition: good  Hospital Course: The patient was admitted on 04/16/15 for an ex lap, TAH, BSO, omentectomy, radical tumor debulking for stage IV ovarian cancer. Surgery was uncomplicated as was her postop recovery.  She was meeting discharge criteria on POD 2 and strongly desired discharge.  Consults: None  Significant Diagnostic Studies: labs:  CBC    Component Value Date/Time   WBC 8.1 04/18/2015 0415   WBC 9.8 04/04/2015 1225   RBC 2.69* 04/18/2015 0415   RBC 3.48* 04/04/2015 1225   HGB 8.4* 04/18/2015 0415   HGB 10.6* 04/04/2015 1225   HCT 25.1* 04/18/2015 0415   HCT 31.5* 04/04/2015 1225   PLT 109* 04/18/2015 0415   PLT 343 04/04/2015 1225   MCV 93.3 04/18/2015 0415   MCV 90.5 04/04/2015 1225   MCH 31.2 04/18/2015 0415   MCH 30.3 04/04/2015 1225   MCHC 33.5 04/18/2015 0415   MCHC 33.5 04/04/2015 1225   RDW 18.1* 04/18/2015 0415   RDW 18.2* 04/04/2015 1225   LYMPHSABS 1.3 04/12/2015 1110   LYMPHSABS 2.2 04/04/2015 1225   MONOABS 0.3 04/12/2015 1110   MONOABS 1.1* 04/04/2015 1225   EOSABS 0.0 04/12/2015 1110   EOSABS 0.0 04/04/2015 1225   BASOSABS 0.0 04/12/2015 1110   BASOSABS 0.0 04/04/2015 1225      Treatments: surgery: see above  Discharge Exam: Blood pressure 97/63, pulse 89, temperature 98.7 F (37.1 C), temperature source Oral, resp. rate 16, height 5\' 3"  (1.6 m), weight 148 lb (67.132 kg), last menstrual period 01/08/2015, SpO2 98 %. General appearance: alert and cooperative Resp: clear to auscultation bilaterally Cardio: regular rate and rhythm, S1, S2 normal, no murmur, click, rub or gallop GI: soft,  non-tender; bowel sounds normal; no masses,  no organomegaly Incision/Wound: in tact  Disposition: Final discharge disposition not confirmed  Discharge Instructions    (HEART FAILURE PATIENTS) Call MD:  Anytime you have any of the following symptoms: 1) 3 pound weight gain in 24 hours or 5 pounds in 1 week 2) shortness of breath, with or without a dry hacking cough 3) swelling in the hands, feet or stomach 4) if you have to sleep on extra pillows at night in order to breathe.    Complete by:  As directed      Call MD for:  difficulty breathing, headache or visual disturbances    Complete by:  As directed      Call MD for:  extreme fatigue    Complete by:  As directed      Call MD for:  hives    Complete by:  As directed      Call MD for:  persistant dizziness or light-headedness    Complete by:  As directed      Call MD for:  persistant nausea and vomiting    Complete by:  As directed      Call MD for:  redness, tenderness, or signs of infection (pain, swelling, redness, odor or green/yellow discharge around incision site)    Complete by:  As directed      Call MD for:  severe uncontrolled pain    Complete by:  As directed      Call  MD for:  temperature >100.4    Complete by:  As directed      Diet - low sodium heart healthy    Complete by:  As directed      Diet general    Complete by:  As directed      Driving Restrictions    Complete by:  As directed   No driving for 7 days or until off narcotic pain medication     Increase activity slowly    Complete by:  As directed      Remove dressing in 24 hours    Complete by:  As directed      Sexual Activity Restrictions    Complete by:  As directed   No intercourse for 6 weeks            Medication List    TAKE these medications        acetaminophen 500 MG tablet  Commonly known as:  TYLENOL  Take 1,000 mg by mouth every 6 (six) hours as needed for moderate pain.     acetaminophen 500 MG tablet  Commonly known as:   TYLENOL  Take 2 tablets (1,000 mg total) by mouth every 6 (six) hours.     dexamethasone 4 MG tablet  Commonly known as:  DECADRON  Take 5 tabs with food 12 hrs and 6 hrs prior to Taxol  Chemotherapy.     enoxaparin 100 MG/ML injection  Commonly known as:  LOVENOX  Inject 0.95 mLs (95 mg total) into the skin daily.     ferrous fumarate 325 (106 FE) MG Tabs tablet  Commonly known as:  HEMOCYTE - 106 mg FE  Take 1 tablet daily on an empty stomach with OJ.     HYDROmorphone 2 MG tablet  Commonly known as:  DILAUDID  Take 1 tablet (2 mg total) by mouth every 4 (four) hours as needed for severe pain.     hydrOXYzine 25 MG tablet  Commonly known as:  ATARAX/VISTARIL  Take 50 mg by mouth at bedtime.     LINZESS 145 MCG Caps capsule  Generic drug:  Linaclotide  Take 1 capsule by mouth at bedtime as needed (help use the bathroom.).     loratadine 10 MG tablet  Commonly known as:  CLARITIN  Take 10 mg by mouth daily as needed (right before chemo.).     LORazepam 1 MG tablet  Commonly known as:  ATIVAN  1 SL or PO every 6 hours as needed for nausea. May use 1-2 tablets  HS as needed     montelukast 10 MG tablet  Commonly known as:  SINGULAIR  Take 10 mg by mouth at bedtime.     naproxen 500 MG tablet  Commonly known as:  NAPROSYN  Take 500 mg by mouth 2 (two) times daily with a meal.     ondansetron 8 MG tablet  Commonly known as:  ZOFRAN  Take 1 tablet (8 mg total) by mouth every 8 (eight) hours as needed for nausea or vomiting.     pantoprazole 40 MG tablet  Commonly known as:  PROTONIX  Take 40 mg by mouth daily.     sennosides-docusate sodium 8.6-50 MG tablet  Commonly known as:  SENOKOT-S  Take 2 tablets by mouth at bedtime as needed for constipation.     senna-docusate 8.6-50 MG tablet  Commonly known as:  SENNA S  Take 1 tablet by mouth daily.     sodium chloride 0.65 %  Soln nasal spray  Commonly known as:  OCEAN  Place 1 spray into both nostrils as needed for  congestion.     SUMAtriptan 100 MG tablet  Commonly known as:  IMITREX  Take 100 mg by mouth as needed for migraine. May repeat in 2 hours if headache persists or recurs.     TACLONEX EX  Apply 1 application topically 3 (three) times daily. With Eucerin 1:1     venlafaxine 100 MG tablet  Commonly known as:  EFFEXOR  Take 100 mg by mouth 2 (two) times daily.     Vitamin D 2000 UNITS tablet  Take 4,000 Units by mouth daily.           Follow-up Information    Follow up with Donaciano Eva, MD.   Specialty:  Obstetrics and Gynecology   Why:  For suture removal   Contact information:   [address] [place] [address] [phone]       Signed: Donaciano Eva 04/18/2015, 1:11 PM

## 2015-04-18 NOTE — Discharge Instructions (Signed)
04/18/2015  Return to work: 6 weeks  Activity: 1. Be up and out of the bed during the day.  Take a nap if needed.  You may walk up steps but be careful and use the hand rail.  Stair climbing will tire you more than you think, you may need to stop part way and rest.   2. No lifting or straining for 6 weeks.  3. No driving for 1-2 weeks.  Do Not drive if you are taking narcotic pain medicine.  4. Shower daily.  Use soap and water on your incision and pat dry; don't rub.   5. No sexual activity and nothing in the vagina for 6 weeks.  6. You should restart your usual lovenox dose one a day starting 04/19/15  7. You will start estrogen therapy with oral tablets daily  8. You can use both tylenol and naproxen, and minimally use dilaudid for high level pain  Diet: 1. Low sodium Heart Healthy Diet is recommended.  2. It is safe to use a laxative if you have difficulty moving your bowels.   Wound Care: 1. Keep clean and dry.  Shower daily.  Reasons to call the Doctor:   Fever - Oral temperature greater than 100.4 degrees Fahrenheit  Foul-smelling vaginal discharge  Difficulty urinating  Nausea and vomiting  Increased pain at the site of the incision that is unrelieved with pain medicine.  Difficulty breathing with or without chest pain  New calf pain especially if only on one side  Sudden, continuing increased vaginal bleeding with or without clots.   Follow-up: 1. See Everitt Amber in 1 weeks for staple removal  Contacts: For questions or concerns you should contact:  Dr. Everitt Amber at [phone]  or at [place]

## 2015-04-20 LAB — TYPE AND SCREEN
ABO/RH(D): A POS
ANTIBODY SCREEN: NEGATIVE
Unit division: 0
Unit division: 0

## 2015-04-22 ENCOUNTER — Encounter: Payer: Self-pay | Admitting: Oncology

## 2015-04-22 NOTE — Progress Notes (Signed)
Medical Oncology  Report of screening bilateral mammograms received from [facility] dated 19-50-93 and will be scanned into this EMR. Heterogeneously dense tissue No mammographic evidence of malignancy Radiologist noted that adjunct imaging with breast MRI would be appropriate given BRCA +.  Godfrey Pick, MD

## 2015-04-23 ENCOUNTER — Ambulatory Visit: Payer: 59 | Attending: Gynecologic Oncology | Admitting: Gynecologic Oncology

## 2015-04-23 DIAGNOSIS — C569 Malignant neoplasm of unspecified ovary: Secondary | ICD-10-CM | POA: Insufficient documentation

## 2015-04-23 NOTE — Progress Notes (Signed)
Follow Up Note: Gyn-Onc  Elizabeth Henry 44 y.o. female  CC:  Chief Complaint  Patient presents with  . Routine Post Op    follow up    HPI:  Elizabeth Henry is a 44 year old female, G2P2, initially referred by Dr. Kennon Rounds for bilateral ovarian masses, ascites, elevated CA-125, and abnormal uterine bleeding. She began experiencing abdominal bloating and early satiety 6 weeks ago in July 2016. At the end of August 2016, she had experienced vaginal bleeding every day that is light. She was evaluated by Dr. Kennon Rounds on 01/25/2015. A pelvic and abdominal ultrasound scan revealed large volume of ascites in the abdomen, bilateral ovarian masses with the right measuring 24.3 x 13.1 x 25.3 cm with solid and cystic components and blood flow within the solid components. The left ovary measured 14.5 x 12.7 x 14.8 cm and containing solid cystic components with increased blood flow. The endometrial stripe was slightly thickened at 9 mm (cc patient is premenopausal). A CA-125 was drawn on 01/28/2015 and was elevated at 3142 units per milliliter.  A CT chest was performed on 01/31/15 which showed an acute PE and pleural effusions and she was started on therapeutic lovenox.  She was started on neoadjuvant chemotherapy (due to her acute PE and the concern about discontinuing anticoagulation for surgery in the acute setting). Prior to starting chemotherapy she underwent paracentesis and thoracentesis (right) both of which confirmed metastatic adencarcinoma consistent with gyn primary.  On 04/16/15, she underwent an exploratory laparotomy with total abdominal hysterectomy bilateral salpingo-oophorectomy, omentectomy radical tumor debulking for ovarian cancer by Dr. Everitt Amber.  Final pathology revealed:  1. Adnexa - ovary +/- tube, neoplastic, right - HIGH GRADE CARCINOMA WITH SEROUS FEATURES INVOLVING OVARY, SEE COMMENT. - BENIGN FALLOPIAN TUBE; NEGATIVE FOR ATYPIA AND MALIGNANCY. 2. Adnexa - ovary +/- tube,  neoplastic, left - HIGH GRADE CARCINOMA WITH SEROUS FEATURES INVOLVING OVARY, SEE COMMENT. - BENIGN FALLOPIAN TUBE; NEGATIVE FOR ATYPIA AND MALIGNANCY. 3. Uterus +/- tubes/ovaries, neoplastic, cervix - PROLIFERATIVE PHASE ENDOMETRIUM; NEGATIVE FOR HYPERPLASIA AND MALIGNANCY. - BENIGN CERVICAL MUCOSA; NEGATIVE FOR INTRAEPITHELIAL LESION OR MALIGNANCY. - UNREMARKABLE UTERINE SEROSA. - BENIGN PARA-UTERAL FIBROVASCULAR AND ADIPOSE SOFT TISSUE; NEGATIVE FOR ATYPIA AND MALIGNANCY. 4. Omentum, resection for tumor - POSITIVE FOR METASTATIC HIGH GRADE CARCINOMA, SEE COMMENT.  Interval History:  She presents today with her husband for post-operative follow up.  She reports doing well at home since surgery.  Tolerating diet with no nausea or emesis.  Minimal pain reported.  Ambulating without difficulty.  Denies vaginal bleeding post-operatively.  Asking about going to a church service for Christmas.  No other concerns voiced.      Review of Systems  Constitutional: Feels well.  No fever, chills, early satiety, unintentional weight loss or gain. Cardiovascular: No chest pain, shortness of breath, or edema.  Pulmonary: No cough or wheeze.  Gastrointestinal: No nausea, vomiting, or diarrhea. No bright red blood per rectum or change in bowel movement.  Genitourinary: No frequency, urgency, or dysuria. No vaginal bleeding or discharge.  Musculoskeletal: No myalgia or joint pain. Neurologic: No weakness, numbness, or change in gait.  Psychology: No depression, anxiety, or insomnia.  Current Meds:  Outpatient Encounter Prescriptions as of 04/23/2015  Medication Sig  . acetaminophen (TYLENOL) 500 MG tablet Take 1,000 mg by mouth every 6 (six) hours as needed for moderate pain.   . Calcipotriene-Betameth Diprop (TACLONEX EX) Apply 1 application topically 3 (three) times daily. With Eucerin 1:1  . Cholecalciferol (VITAMIN D) 2000  UNITS tablet Take 4,000 Units by mouth daily.  . clobetasol (OLUX) 0.05 %  topical foam   . dexamethasone (DECADRON) 4 MG tablet Take 5 tabs with food 12 hrs and 6 hrs prior to Taxol  Chemotherapy.  . enoxaparin (LOVENOX) 100 MG/ML injection Inject 0.95 mLs (95 mg total) into the skin daily.  Marland Kitchen estradiol (ESTRACE) 0.5 MG tablet Take 1 tablet (0.5 mg total) by mouth daily.  . hydrOXYzine (ATARAX/VISTARIL) 25 MG tablet Take 50 mg by mouth at bedtime.   Marland Kitchen LINZESS 145 MCG CAPS capsule Take 1 capsule by mouth at bedtime as needed (help use the bathroom.).   Marland Kitchen loratadine (CLARITIN) 10 MG tablet Take 10 mg by mouth daily as needed (right before chemo.).   Marland Kitchen LORazepam (ATIVAN) 1 MG tablet 1 SL or PO every 6 hours as needed for nausea. May use 1-2 tablets  HS as needed (Patient taking differently: Take 2 mg by mouth at bedtime. PO every 6 hours as needed for nausea the day after cheomo)  . montelukast (SINGULAIR) 10 MG tablet Take 10 mg by mouth at bedtime.  . naproxen (NAPROSYN) 500 MG tablet Take 500 mg by mouth 2 (two) times daily with a meal.  . ondansetron (ZOFRAN) 8 MG tablet Take 1 tablet (8 mg total) by mouth every 8 (eight) hours as needed for nausea or vomiting.  . pantoprazole (PROTONIX) 40 MG tablet Take 40 mg by mouth daily.  . sennosides-docusate sodium (SENOKOT-S) 8.6-50 MG tablet Take 2 tablets by mouth at bedtime as needed for constipation.  . sodium chloride (OCEAN) 0.65 % SOLN nasal spray Place 1 spray into both nostrils as needed for congestion.  Marland Kitchen venlafaxine (EFFEXOR) 100 MG tablet Take 100 mg by mouth 2 (two) times daily.  . [DISCONTINUED] senna-docusate (SENNA S) 8.6-50 MG tablet Take 1 tablet by mouth daily.  . ferrous fumarate (HEMOCYTE - 106 MG FE) 325 (106 FE) MG TABS tablet Take 1 tablet daily on an empty stomach with OJ. (Patient not taking: Reported on 04/23/2015)  . HYDROmorphone (DILAUDID) 2 MG tablet Take 1 tablet (2 mg total) by mouth every 4 (four) hours as needed for severe pain. (Patient not taking: Reported on 04/23/2015)  . SUMAtriptan  (IMITREX) 100 MG tablet Take 100 mg by mouth as needed for migraine. Reported on 04/23/2015  . tiZANidine (ZANAFLEX) 4 MG tablet Reported on 04/23/2015  . [DISCONTINUED] acetaminophen (TYLENOL) 500 MG tablet Take 2 tablets (1,000 mg total) by mouth every 6 (six) hours.   No facility-administered encounter medications on file as of 04/23/2015.    Allergy:  Allergies  Allergen Reactions  . Ambien [Zolpidem Tartrate] Swelling  . Compazine [Prochlorperazine Edisylate] Hives  . Tylox [Oxycodone-Acetaminophen] Hives    Social Hx:   Social History   Social History  . Marital Status: Married    Spouse Name: N/A  . Number of Children: 2  . Years of Education: 16   Occupational History  . Self-employed    Social History Main Topics  . Smoking status: Former Smoker -- 1.00 packs/day for 10 years    Types: Cigarettes    Quit date: 10/18/2002  . Smokeless tobacco: Never Used     Comment: smoked on and off for 10 yrs total - up to 1 ppd at most  . Alcohol Use: No     Comment: very rarely  . Drug Use: No  . Sexual Activity: Not Currently   Other Topics Concern  . Not on file   Social  History Narrative   Fun: Go to [employer]; crochet   Denies religious beliefs effecting health care.     Past Surgical Hx:  Past Surgical History  Procedure Laterality Date  . Wisdom tooth extraction    . Abdominal hysterectomy Bilateral 04/16/2015    Procedure: HYSTERECTOMY ABDOMINAL TOTAL, EXPLORATORY LAPAROTOMY, BILATERAL SALPINGO OOPHERECTOMY, OMENTECTOMY, DEBULKING;  Surgeon: Everitt Amber, MD;  Location: [facility];  Service: Gynecology;  Laterality: Bilateral;    Past Medical Hx:  Past Medical History  Diagnosis Date  . Anxiety   . Allergy   . Migraines   . GERD (gastroesophageal reflux disease)   . Cancer ([place])   . Pneumonia     bronchialpneumonia circa [id number]  . Strep throat   . Pulmonary embolism [facility]) October 2016    Family Hx:  Family History  Problem Relation Age of Onset  .  Breast cancer Mother 47  . Healthy Father   . Breast cancer Maternal Grandmother     dx. 69 or younger  . Brain cancer Paternal Grandmother     unspecified tumor type  . HIV Brother   . Leukemia Paternal Uncle   . HIV Brother   . Alzheimer's disease Maternal Grandfather   . Diabetes Maternal Grandfather   . Breast cancer Other   . Cancer Paternal Uncle     unspecified type    Vitals:  Last menstrual period 01/03/2015.  Physical Exam:  General: Well developed, well nourished female in no acute distress. Alert and oriented x 3.  Cardiovascular: Regular rate and rhythm. S1 and S2 normal.  Lungs: Clear to auscultation bilaterally. No wheezes/crackles/rhonchi noted.  Skin: No rashes or lesions present. Back: No CVA tenderness.  Abdomen: Abdomen soft, non-tender and non-obese. Active bowel sounds in all quadrants. No evidence of a fluid wave or abdominal masses. Midline incision with dermabond intact without erythema or drainage.  Extremities: No bilateral cyanosis, edema, or clubbing.   Assessment/Plan:  44 year old female s/p exploratory laparotomy with total abdominal hysterectomy bilateral salpingo-oophorectomy, omentectomy radical tumor debulking for ovarian cancer by Dr. Everitt Amber on 04/16/15 for high grade carcinoma with serous features involving both ovaries.  She is doing well post-operatively.  Post-operative instructions discussed.  Appointment made for her to see Dr. Denman George on 05/02/15 and she will see Dr. Marko Plume as scheduled on 04/30/15.  Reportable signs and symptoms reviewed.  Advised she could attend the Christmas function at her church as tolerated.  She is advised to call for any questions or concerns.      Ingri Diemer DEAL, NP 04/23/2015, 3:20 PM

## 2015-04-23 NOTE — Patient Instructions (Signed)
Doing well post-operatively.  Continue with post-op restrictions.  Plan to follow up as scheduled or sooner if needed.

## 2015-04-29 ENCOUNTER — Other Ambulatory Visit: Payer: Self-pay | Admitting: Oncology

## 2015-04-29 DIAGNOSIS — C561 Malignant neoplasm of right ovary: Secondary | ICD-10-CM

## 2015-04-29 DIAGNOSIS — C562 Malignant neoplasm of left ovary: Secondary | ICD-10-CM

## 2015-04-29 DIAGNOSIS — C563 Malignant neoplasm of bilateral ovaries: Secondary | ICD-10-CM | POA: Insufficient documentation

## 2015-04-29 DIAGNOSIS — C569 Malignant neoplasm of unspecified ovary: Secondary | ICD-10-CM

## 2015-04-30 ENCOUNTER — Encounter: Payer: Self-pay | Admitting: Oncology

## 2015-04-30 ENCOUNTER — Ambulatory Visit (HOSPITAL_BASED_OUTPATIENT_CLINIC_OR_DEPARTMENT_OTHER): Payer: 59 | Admitting: Oncology

## 2015-04-30 ENCOUNTER — Other Ambulatory Visit (HOSPITAL_BASED_OUTPATIENT_CLINIC_OR_DEPARTMENT_OTHER): Payer: 59

## 2015-04-30 VITALS — BP 107/71 | HR 99 | Temp 98.2°F | Resp 18 | Ht 63.0 in | Wt 136.6 lb

## 2015-04-30 DIAGNOSIS — Z7901 Long term (current) use of anticoagulants: Secondary | ICD-10-CM

## 2015-04-30 DIAGNOSIS — R110 Nausea: Secondary | ICD-10-CM

## 2015-04-30 DIAGNOSIS — Z1509 Genetic susceptibility to other malignant neoplasm: Secondary | ICD-10-CM

## 2015-04-30 DIAGNOSIS — C561 Malignant neoplasm of right ovary: Secondary | ICD-10-CM

## 2015-04-30 DIAGNOSIS — T451X5A Adverse effect of antineoplastic and immunosuppressive drugs, initial encounter: Secondary | ICD-10-CM

## 2015-04-30 DIAGNOSIS — F418 Other specified anxiety disorders: Secondary | ICD-10-CM

## 2015-04-30 DIAGNOSIS — R112 Nausea with vomiting, unspecified: Secondary | ICD-10-CM

## 2015-04-30 DIAGNOSIS — C569 Malignant neoplasm of unspecified ovary: Secondary | ICD-10-CM

## 2015-04-30 DIAGNOSIS — G620 Drug-induced polyneuropathy: Secondary | ICD-10-CM

## 2015-04-30 DIAGNOSIS — C562 Malignant neoplasm of left ovary: Secondary | ICD-10-CM | POA: Diagnosis not present

## 2015-04-30 DIAGNOSIS — K219 Gastro-esophageal reflux disease without esophagitis: Secondary | ICD-10-CM

## 2015-04-30 DIAGNOSIS — I2699 Other pulmonary embolism without acute cor pulmonale: Secondary | ICD-10-CM

## 2015-04-30 DIAGNOSIS — D649 Anemia, unspecified: Secondary | ICD-10-CM | POA: Diagnosis not present

## 2015-04-30 DIAGNOSIS — Z1501 Genetic susceptibility to malignant neoplasm of breast: Secondary | ICD-10-CM

## 2015-04-30 DIAGNOSIS — C563 Malignant neoplasm of bilateral ovaries: Secondary | ICD-10-CM

## 2015-04-30 LAB — CBC WITH DIFFERENTIAL/PLATELET
BASO%: 0.8 % (ref 0.0–2.0)
BASOS ABS: 0 10*3/uL (ref 0.0–0.1)
EOS%: 2.9 % (ref 0.0–7.0)
Eosinophils Absolute: 0.2 10*3/uL (ref 0.0–0.5)
HEMATOCRIT: 32.3 % — AB (ref 34.8–46.6)
HGB: 10.7 g/dL — ABNORMAL LOW (ref 11.6–15.9)
LYMPH#: 1.6 10*3/uL (ref 0.9–3.3)
LYMPH%: 24.8 % (ref 14.0–49.7)
MCH: 31.2 pg (ref 25.1–34.0)
MCHC: 33.2 g/dL (ref 31.5–36.0)
MCV: 93.9 fL (ref 79.5–101.0)
MONO#: 0.3 10*3/uL (ref 0.1–0.9)
MONO%: 4.5 % (ref 0.0–14.0)
NEUT#: 4.3 10*3/uL (ref 1.5–6.5)
NEUT%: 67 % (ref 38.4–76.8)
PLATELETS: 507 10*3/uL — AB (ref 145–400)
RBC: 3.44 10*6/uL — ABNORMAL LOW (ref 3.70–5.45)
RDW: 21.3 % — ABNORMAL HIGH (ref 11.2–14.5)
WBC: 6.4 10*3/uL (ref 3.9–10.3)

## 2015-04-30 LAB — COMPREHENSIVE METABOLIC PANEL
ALK PHOS: 88 U/L (ref 40–150)
ALT: 37 U/L (ref 0–55)
ANION GAP: 10 meq/L (ref 3–11)
AST: 18 U/L (ref 5–34)
Albumin: 3.8 g/dL (ref 3.5–5.0)
BUN: 13 mg/dL (ref 7.0–26.0)
CALCIUM: 9.4 mg/dL (ref 8.4–10.4)
CHLORIDE: 103 meq/L (ref 98–109)
CO2: 28 mEq/L (ref 22–29)
CREATININE: 0.9 mg/dL (ref 0.6–1.1)
EGFR: 79 mL/min/{1.73_m2} — ABNORMAL LOW (ref >90)
Glucose: 99 mg/dl (ref 70–140)
Potassium: 3.4 mEq/L — ABNORMAL LOW (ref 3.5–5.1)
Sodium: 141 mEq/L (ref 136–145)
Total Protein: 7.2 g/dL (ref 6.4–8.3)

## 2015-04-30 LAB — HCG, QUANTITATIVE, PREGNANCY: HCG, BETA CHAIN, QUANT, S: 3.1 m[IU]/mL
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
## 2015-05-01 ENCOUNTER — Other Ambulatory Visit: Payer: Self-pay | Admitting: Oncology

## 2015-05-01 ENCOUNTER — Telehealth: Payer: Self-pay | Admitting: Oncology

## 2015-05-01 DIAGNOSIS — C569 Malignant neoplasm of unspecified ovary: Secondary | ICD-10-CM

## 2015-05-01 MED ORDER — LORAZEPAM 1 MG PO TABS: ORAL_TABLET | ORAL | Status: DC

## 2015-05-01 MED ORDER — ENOXAPARIN SODIUM 100 MG/ML ~~LOC~~ SOLN
95.0000 mg | SUBCUTANEOUS | Status: DC
Start: 2015-05-01 — End: 2015-06-17

## 2015-05-01 NOTE — Telephone Encounter (Signed)
Appointments made and placed a note for patient to get a new schedule 12/29 at dr Denman George appt

## 2015-05-01 NOTE — Telephone Encounter (Signed)
-----   Message from Gordy Levan, MD sent at 05/01/2015  7:44 AM EST ----- Labs seen and need follow up: please let her know the other marker does not suggest the other cancer type (choriocarcinoma) that was questioned in the pathology report -  Her potassium was just a little low yesterday, so needs to increase in diet

## 2015-05-01 NOTE — Telephone Encounter (Signed)
Left a message in her voicemail stating that the ativan and Lovenox prescriptions were sent to [facility] outpatient pharmacy as she requested. Also gave her the information  regarding lab work from visit yesterday as noted below by Dr. Marko Plume.   Suggested foods such as cantaloupe, OJ, bananas, skins of baked potatoes. She can call back to the office if she has any questions.

## 2015-05-02 ENCOUNTER — Encounter: Payer: Self-pay | Admitting: Gynecologic Oncology

## 2015-05-02 ENCOUNTER — Ambulatory Visit: Payer: 59 | Attending: Gynecologic Oncology | Admitting: Gynecologic Oncology

## 2015-05-02 VITALS — BP 113/75 | HR 95 | Temp 98.1°F | Resp 18 | Ht 63.0 in | Wt 135.3 lb

## 2015-05-02 DIAGNOSIS — C562 Malignant neoplasm of left ovary: Secondary | ICD-10-CM | POA: Diagnosis not present

## 2015-05-02 DIAGNOSIS — C569 Malignant neoplasm of unspecified ovary: Secondary | ICD-10-CM | POA: Diagnosis present

## 2015-05-02 DIAGNOSIS — C561 Malignant neoplasm of right ovary: Secondary | ICD-10-CM | POA: Diagnosis not present

## 2015-05-02 DIAGNOSIS — Z86711 Personal history of pulmonary embolism: Secondary | ICD-10-CM | POA: Diagnosis not present

## 2015-05-02 NOTE — Progress Notes (Signed)
Consult Note: Gyn-Onc  Consult was requested by Dr. Kennon Rounds for the evaluation of Elizabeth Henry 44 y.o. female   CC:  Chief Complaint  Patient presents with  . Ovarian Cancer    F/U    Assessment/Plan:  Elizabeth Henry  is a 44 y.o.  year old with stage IV ovarian cancer in the setting of acute PE, s/p 3 cycles of neoadjuvant chemotherapy s/p ex lap, TAH, BSO, omentectomy, interval debulking to no macroscopic residual disease on 05/02/15. 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  BRCA deleterious mutation positive.  She is doing well from surgery.  Plan for resumption of chemotherapy with 3 more cycles of paclitaxel and carboplatin followed by CT imaging of chest/abdo/pelvis and an appointment with me. She is cleared from a postop standpoint to resume chemotherapy.  Plan to continue Lovenox until completed active therapy and 6 months after clot.   HPI: Elizabeth Henry is a very pleasant 44 year old G2P2 who was initially seen in consultation at the request of Dr Kennon Rounds for bilateral  Ovarian masses, ascites, elevated CA-125, and abnormal uterine bleeding. The patient began experiencing abdominal bloating and early satiety 6 weeks ago in July 2016. She since the end of August 2016 she has experienced vaginal bleeding every day that is light. She denies pain other than stretching pain in her abdomen. She's had some constipation but no change in the caliber of his stools and no blood in her bowel movements.  She was evaluated by Dr. Kennon Rounds on 01/25/2015. A pelvic and abdominal ultrasound scan were ordered and revealed large volume of ascites in the abdomen, bilateral ovarian masses with the right measuring 24.3 x 13.1 x 25.3 cm with solid and cystic components and blood flow within the solid components. The left ovary measured 14.5 x 12.7 x 14.8 cm and containing solid cystic components with increased blood flow. The endometrial stripe  was slightly thickened at 9 mm (cc patient is premenopausal). A CA-125 was drawn on 01/28/2015 and was elevated at 3142 units per milliliter.  A CT chest was performed on 01/31/15 which showed an acute PE and pleural effusions.  She was started on therapeutic lovenox.  She was started on neoadjuvant chemotherapy (due to her acute PE and the concern about discontinuing anticoagulation for surgery in the acute setting).  Prior to starting chemotherapy she underwent paracentesis and thoracentesis (right) both of which confirmed metastatic adencarcinoma consistent with gyn primary.  On 02/12/15 she began chemotherapy with paclitaxel and carboplatin. She has tolerated this well with no dose delays. She feels generally much better with resolution of ascites. She is eating well.  Day 1 of cycle 3 was 03/25/15.  CA 125 has decreased to 713 on 03/21/15. CT on 03/29/15 showed interval resolution of ascites and significant improvement in upper abdominal disease with persistent ovarian cysts.  Interval Hx: On 04/16/15 she underwent an ex lap, TAH, BSO, omentectomy and radical tumor debulking to no gross residual disease. She tolerated surgery well and was discharged home on POD 2. 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  She has no complaints today with respect to her postop recovery.  Current Meds:  Outpatient Encounter Prescriptions as of 05/02/2015  Medication Sig  . acetaminophen (TYLENOL) 500 MG tablet Take 1,000 mg by mouth every 6 (six) hours as needed for moderate pain.   . Calcipotriene-Betameth Diprop (TACLONEX EX) Apply 1 application topically  3 (three) times daily. Reported on 04/30/2015  . Cholecalciferol (VITAMIN D) 2000 UNITS tablet Take 4,000 Units by mouth daily.  . clobetasol (OLUX) 0.05 % topical foam Reported on 04/30/2015  . dexamethasone (DECADRON) 4 MG tablet Take 5 tabs with food 12 hrs and 6 hrs  prior to Taxol  Chemotherapy. (Patient not taking: Reported on 04/30/2015)  . enoxaparin (LOVENOX) 100 MG/ML injection Inject 0.95 mLs (95 mg total) into the skin daily.  Marland Kitchen estradiol (ESTRACE) 0.5 MG tablet Take 1 tablet (0.5 mg total) by mouth daily.  . ferrous fumarate (HEMOCYTE - 106 MG FE) 325 (106 FE) MG TABS tablet Take 1 tablet daily on an empty stomach with OJ.  Marland Kitchen HYDROmorphone (DILAUDID) 2 MG tablet Take 1 tablet (2 mg total) by mouth every 4 (four) hours as needed for severe pain. (Patient not taking: Reported on 04/23/2015)  . hydrOXYzine (ATARAX/VISTARIL) 25 MG tablet Take 50 mg by mouth at bedtime.   Marland Kitchen LINZESS 145 MCG CAPS capsule Take 1 capsule by mouth at bedtime as needed (help use the bathroom.).   Marland Kitchen loratadine (CLARITIN) 10 MG tablet Take 10 mg by mouth daily as needed (right before chemo.). Reported on 04/30/2015  . LORazepam (ATIVAN) 1 MG tablet 1 SL or PO every 6 hours as needed for nausea. May use 1-2 tablets  HS as needed  . montelukast (SINGULAIR) 10 MG tablet Take 10 mg by mouth at bedtime.  . naproxen (NAPROSYN) 500 MG tablet Take 500 mg by mouth 2 (two) times daily with a meal.  . ondansetron (ZOFRAN) 8 MG tablet Take 1 tablet (8 mg total) by mouth every 8 (eight) hours as needed for nausea or vomiting. (Patient not taking: Reported on 04/30/2015)  . pantoprazole (PROTONIX) 40 MG tablet Take 40 mg by mouth daily.  . sennosides-docusate sodium (SENOKOT-S) 8.6-50 MG tablet Take 2-4 tablets by mouth at bedtime as needed for constipation.   . sodium chloride (OCEAN) 0.65 % SOLN nasal spray Place 1 spray into both nostrils as needed for congestion. (Patient not taking: Reported on 04/30/2015)  . SUMAtriptan (IMITREX) 100 MG tablet Take 100 mg by mouth as needed for migraine. Reported on 04/23/2015  . tiZANidine (ZANAFLEX) 4 MG tablet Reported on 04/23/2015  . venlafaxine (EFFEXOR) 100 MG tablet Take 100 mg by mouth 2 (two) times daily.   No facility-administered encounter  medications on file as of 05/02/2015.    Allergy:  Allergies  Allergen Reactions  . Ambien [Zolpidem Tartrate] Swelling  . Compazine [Prochlorperazine Edisylate] Hives  . Prochlorperazine Hives  . Tylox [Oxycodone-Acetaminophen] Hives    Social Hx:   Social History   Social History  . Marital Status: Married    Spouse Name: N/A  . Number of Children: 2  . Years of Education: 16   Occupational History  . Self-employed    Social History Main Topics  . Smoking status: Former Smoker -- 1.00 packs/day for 10 years    Types: Cigarettes    Quit date: 10/18/2002  . Smokeless tobacco: Never Used     Comment: smoked on and off for 10 yrs total - up to 1 ppd at most  . Alcohol Use: No     Comment: very rarely  . Drug Use: No  . Sexual Activity: Not Currently   Other Topics Concern  . Not on file   Social History Narrative   Fun: Go to [employer]; crochet   Denies religious beliefs effecting health care.     Past  Surgical Hx:  Past Surgical History  Procedure Laterality Date  . Wisdom tooth extraction    . Abdominal hysterectomy Bilateral 04/16/2015    Procedure: HYSTERECTOMY ABDOMINAL TOTAL, EXPLORATORY LAPAROTOMY, BILATERAL SALPINGO OOPHERECTOMY, OMENTECTOMY, DEBULKING;  Surgeon: Everitt Amber, MD;  Location: [facility];  Service: Gynecology;  Laterality: Bilateral;    Past Medical Hx:  Past Medical History  Diagnosis Date  . Anxiety   . Allergy   . Migraines   . GERD (gastroesophageal reflux disease)   . Cancer ([place])   . Pneumonia     bronchialpneumonia circa 8127  . Strep throat   . Pulmonary embolism [facility]) October 2016    Past Gynecological History:  Remote hx of abn paps.  Patient's last menstrual period was 01/03/2015.  Family Hx:  Family History  Problem Relation Age of Onset  . Breast cancer Mother 76  . Healthy Father   . Breast cancer Maternal Grandmother     dx. 11 or younger  . Brain cancer Paternal Grandmother     unspecified tumor type  . HIV  Brother   . Leukemia Paternal Uncle   . HIV Brother   . Alzheimer's disease Maternal Grandfather   . Diabetes Maternal Grandfather   . Breast cancer Other   . Cancer Paternal Uncle     unspecified type    Review of Systems:  Constitutional  Feels distended  ENT Normal appearing ears and nares bilaterally Skin/Breast  No rash, sores, jaundice, itching, dryness Cardiovascular  No chest pain, shortness of breath, or edema  Pulmonary  No cough or wheeze.  Gastro Intestinal  No nausea, vomitting, or diarrhoea.  Genito Urinary  No frequency, urgency, dysuria, no vaginal bleeding Musculo Skeletal  No myalgia, arthralgia, joint swelling or pain  Neurologic  No weakness, numbness, change in gait,  Psychology  No depression, anxiety, insomnia.   Vitals:  Blood pressure 113/75, pulse 95, temperature 98.1 F (36.7 C), temperature source Oral, resp. rate 18, height _0  (1.6 m), weight 135 lb 4.8 oz (61.372 kg), last menstrual period 01/03/2015, SpO2 99 %.  Physical Exam: WD in NAD Neck  Supple NROM, without any enlargements.  Lymph Node Survey No cervical supraclavicular or inguinal adenopathy Cardiovascular  Pulse normal rate, regularity and rhythm. S1 and S2 normal.  Lungs  Clear to auscultation bilateraly, without wheezes/crackles/rhonchi. Good air movement.  Skin  No rash/lesions/breakdown  Psychiatry  Alert and oriented to person, place, and time  Abdomen  Normoactive bowel sounds, abdomen non distended with well healed incision.  Back No CVA tenderness Genito Urinary  Vulva/vagina: Normal external female genitalia.   No lesions. No discharge or bleeding.  Bladder/urethra:  No lesions or masses, well supported bladder  Vagina: cuff healing normally, in tact, no bleeding  Cervix and uterus: surgically absent  Adnexa: no masses. Rectal  Good tone, no masses no cul de sac nodularity.  Extremities  No bilateral cyanosis, clubbing or edema.    Donaciano Eva, MD  05/02/2015, 11:48 AM

## 2015-05-02 NOTE — Patient Instructions (Signed)
Plan to follow up with Dr. Denman George after the completion of three more cycles of chemotherapy and imaging.  Please call for any questions or concerns.

## 2015-05-03 ENCOUNTER — Telehealth: Payer: Self-pay

## 2015-05-03 NOTE — Telephone Encounter (Signed)
LM in Elizabeth Henry's vm stating that Dr. Marko Plume will add IVF ~2 days after her chemotherapy treatments as previously done. She should hear from the schedulers by the middle of next week. If not, she should call scheduling. It is fine for her to get her dental retainer adjusted and teeth cleaned prior to resumption of chemotherapy on 05-12-14.

## 2015-05-05 ENCOUNTER — Other Ambulatory Visit: Payer: Self-pay | Admitting: Oncology

## 2015-05-05 DIAGNOSIS — T451X5A Adverse effect of antineoplastic and immunosuppressive drugs, initial encounter: Secondary | ICD-10-CM

## 2015-05-05 DIAGNOSIS — R112 Nausea with vomiting, unspecified: Secondary | ICD-10-CM | POA: Insufficient documentation

## 2015-05-06 ENCOUNTER — Other Ambulatory Visit: Payer: Self-pay | Admitting: Oncology

## 2015-05-07 ENCOUNTER — Telehealth: Payer: Self-pay

## 2015-05-07 ENCOUNTER — Telehealth: Payer: Self-pay | Admitting: Oncology

## 2015-05-07 DIAGNOSIS — C569 Malignant neoplasm of unspecified ovary: Secondary | ICD-10-CM

## 2015-05-07 MED ORDER — PROMETHAZINE HCL 25 MG PO TABS: 25.0000 mg | ORAL_TABLET | Freq: Four times a day (QID) | ORAL | Status: DC | PRN

## 2015-05-07 NOTE — Telephone Encounter (Signed)
Called and left a message with 1/11 ivf appointment

## 2015-05-07 NOTE — Telephone Encounter (Signed)
lvm re: Dr Edwyna Shell note. Asked her to call if she needs decadron refill.

## 2015-05-07 NOTE — Telephone Encounter (Signed)
-----   Message from Gordy Levan, MD sent at 05/05/2015  9:23 PM EST ----- Please send in script for phenergan tablets 25 mg  1 every 6 hrs as needed for nausea   #12   1 RF  Let her know this one, and that schedulers will contact her re IVF 1-11 and granix 1-16 and 1-17. Please be sure she has premed decadron for taxol  thanks

## 2015-05-10 ENCOUNTER — Other Ambulatory Visit: Payer: Self-pay | Admitting: Oncology

## 2015-05-10 ENCOUNTER — Telehealth: Payer: Self-pay | Admitting: *Deleted

## 2015-05-10 MED FILL — FERROCITE TABLET: 324 | 30 days supply | Qty: 30 | Fill #1

## 2015-05-10 MED FILL — DEXAMETHASONE 4 MG TABLET: 4 | 2 days supply | Qty: 20 | Fill #0

## 2015-05-10 NOTE — Telephone Encounter (Signed)
Called pt to inform her that refill for Dexamethasone will be ready for p/u at [facility] today. I also reminded her of instructions taking this med prior to her treatment on 05/13/15. Pt verbalized understanding . No further concerns. Message to be fwd to Evlyn Clines, MD.

## 2015-05-13 ENCOUNTER — Other Ambulatory Visit: Payer: Self-pay | Admitting: Hematology

## 2015-05-13 ENCOUNTER — Ambulatory Visit (HOSPITAL_BASED_OUTPATIENT_CLINIC_OR_DEPARTMENT_OTHER): Payer: 59

## 2015-05-13 ENCOUNTER — Other Ambulatory Visit (HOSPITAL_BASED_OUTPATIENT_CLINIC_OR_DEPARTMENT_OTHER): Payer: 59

## 2015-05-13 VITALS — BP 100/78 | HR 100 | Temp 98.0°F | Resp 18

## 2015-05-13 DIAGNOSIS — C561 Malignant neoplasm of right ovary: Secondary | ICD-10-CM

## 2015-05-13 DIAGNOSIS — C569 Malignant neoplasm of unspecified ovary: Secondary | ICD-10-CM

## 2015-05-13 DIAGNOSIS — C563 Malignant neoplasm of bilateral ovaries: Secondary | ICD-10-CM

## 2015-05-13 DIAGNOSIS — Z5111 Encounter for antineoplastic chemotherapy: Secondary | ICD-10-CM

## 2015-05-13 DIAGNOSIS — C562 Malignant neoplasm of left ovary: Principal | ICD-10-CM

## 2015-05-13 LAB — CBC WITH DIFFERENTIAL/PLATELET
BASO%: 0.3 % (ref 0.0–2.0)
Basophils Absolute: 0 10*3/uL (ref 0.0–0.1)
EOS%: 0.2 % (ref 0.0–7.0)
Eosinophils Absolute: 0 10*3/uL (ref 0.0–0.5)
HCT: 37.4 % (ref 34.8–46.6)
HGB: 12.3 g/dL (ref 11.6–15.9)
LYMPH%: 7.5 % — AB (ref 14.0–49.7)
MCH: 32 pg (ref 25.1–34.0)
MCHC: 33 g/dL (ref 31.5–36.0)
MCV: 97.2 fL (ref 79.5–101.0)
MONO#: 0 10*3/uL — ABNORMAL LOW (ref 0.1–0.9)
MONO%: 0.4 % (ref 0.0–14.0)
NEUT#: 5.1 10*3/uL (ref 1.5–6.5)
NEUT%: 91.6 % — AB (ref 38.4–76.8)
Platelets: 257 10*3/uL (ref 145–400)
RBC: 3.85 10*6/uL (ref 3.70–5.45)
RDW: 18.8 % — ABNORMAL HIGH (ref 11.2–14.5)
WBC: 5.6 10*3/uL (ref 3.9–10.3)
lymph#: 0.4 10*3/uL — ABNORMAL LOW (ref 0.9–3.3)

## 2015-05-13 LAB — COMPREHENSIVE METABOLIC PANEL
ALT: 29 U/L (ref 0–55)
ANION GAP: 13 meq/L — AB (ref 3–11)
AST: 15 U/L (ref 5–34)
Albumin: 4.2 g/dL (ref 3.5–5.0)
Alkaline Phosphatase: 69 U/L (ref 40–150)
BUN: 9.8 mg/dL (ref 7.0–26.0)
CHLORIDE: 103 meq/L (ref 98–109)
CO2: 22 meq/L (ref 22–29)
CREATININE: 1 mg/dL (ref 0.6–1.1)
Calcium: 9.6 mg/dL (ref 8.4–10.4)
EGFR: 68 mL/min/{1.73_m2} — ABNORMAL LOW (ref >90)
GLUCOSE: 217 mg/dL — AB (ref 70–140)
Potassium: 4 mEq/L (ref 3.5–5.1)
SODIUM: 139 meq/L (ref 136–145)
Total Bilirubin: 0.36 mg/dL (ref 0.20–1.20)
Total Protein: 7.4 g/dL (ref 6.4–8.3)

## 2015-05-13 MED ORDER — FAMOTIDINE IN NACL 20-0.9 MG/50ML-% IV SOLN
20.0000 mg | Freq: Once | INTRAVENOUS | Status: AC
  Administered 2015-05-13: 20 mg via INTRAVENOUS

## 2015-05-13 MED ORDER — PROMETHAZINE HCL 25 MG/ML IJ SOLN
12.5000 mg | Freq: Once | INTRAMUSCULAR | Status: DC | PRN
  Filled 2015-05-13: qty 1

## 2015-05-13 MED ORDER — SODIUM CHLORIDE 0.9 % IV SOLN
Freq: Once | INTRAVENOUS | Status: AC
  Administered 2015-05-13: 12:00:00 via INTRAVENOUS
  Filled 2015-05-13: qty 8

## 2015-05-13 MED ORDER — SODIUM CHLORIDE 0.9 % IV SOLN
470.0000 mg | Freq: Once | INTRAVENOUS | Status: AC
  Administered 2015-05-13: 470 mg via INTRAVENOUS
  Filled 2015-05-13: qty 47

## 2015-05-13 MED ORDER — FAMOTIDINE IN NACL 20-0.9 MG/50ML-% IV SOLN
INTRAVENOUS | Status: AC
  Filled 2015-05-13: qty 50

## 2015-05-13 MED ORDER — SODIUM CHLORIDE 0.9 % IV SOLN
Freq: Once | INTRAVENOUS | Status: AC
  Administered 2015-05-13: 11:00:00 via INTRAVENOUS
  Filled 2015-05-13: qty 5

## 2015-05-13 MED ORDER — PACLITAXEL CHEMO INJECTION 300 MG/50ML
175.0000 mg/m2 | Freq: Once | INTRAVENOUS | Status: DC
  Filled 2015-05-13: qty 48

## 2015-05-13 MED ORDER — DIPHENHYDRAMINE HCL 50 MG/ML IJ SOLN
INTRAMUSCULAR | Status: AC
  Filled 2015-05-13: qty 1

## 2015-05-13 MED ORDER — DIPHENHYDRAMINE HCL 50 MG/ML IJ SOLN
50.0000 mg | Freq: Once | INTRAMUSCULAR | Status: AC
  Administered 2015-05-13: 50 mg via INTRAVENOUS

## 2015-05-13 MED ORDER — SODIUM CHLORIDE 0.9 % IV SOLN
Freq: Once | INTRAVENOUS | Status: AC
  Administered 2015-05-13: 11:00:00 via INTRAVENOUS

## 2015-05-13 NOTE — Patient Instructions (Signed)
[facility] Discharge Instructions for Patients Receiving Chemotherapy  Today you received the following chemotherapy agents Paclitaxel/Carboplatin.   To help prevent nausea and vomiting after your treatment, we encourage you to take your nausea medication as directed.    If you develop nausea and vomiting that is not controlled by your nausea medication, call the clinic.   BELOW ARE SYMPTOMS THAT SHOULD BE REPORTED IMMEDIATELY:  *FEVER GREATER THAN 100.5 F  *CHILLS WITH OR WITHOUT FEVER  NAUSEA AND VOMITING THAT IS NOT CONTROLLED WITH YOUR NAUSEA MEDICATION  *UNUSUAL SHORTNESS OF BREATH  *UNUSUAL BRUISING OR BLEEDING  TENDERNESS IN MOUTH AND THROAT WITH OR WITHOUT PRESENCE OF ULCERS  *URINARY PROBLEMS  *BOWEL PROBLEMS  UNUSUAL RASH Items with * indicate a potential emergency and should be followed up as soon as possible.  Feel free to call the clinic you have any questions or concerns. The clinic phone number is [phone].  Please show the CHEMO ALERT CARD at check-in to the Emergency Department and triage nurse.   

## 2015-05-15 ENCOUNTER — Ambulatory Visit (HOSPITAL_BASED_OUTPATIENT_CLINIC_OR_DEPARTMENT_OTHER): Payer: 59

## 2015-05-15 VITALS — BP 110/74 | HR 79 | Temp 98.1°F | Resp 20

## 2015-05-15 DIAGNOSIS — C562 Malignant neoplasm of left ovary: Secondary | ICD-10-CM

## 2015-05-15 DIAGNOSIS — C561 Malignant neoplasm of right ovary: Secondary | ICD-10-CM | POA: Diagnosis not present

## 2015-05-15 DIAGNOSIS — E860 Dehydration: Secondary | ICD-10-CM | POA: Diagnosis not present

## 2015-05-15 DIAGNOSIS — C569 Malignant neoplasm of unspecified ovary: Secondary | ICD-10-CM

## 2015-05-15 DIAGNOSIS — C563 Malignant neoplasm of bilateral ovaries: Secondary | ICD-10-CM

## 2015-05-15 MED ORDER — SODIUM CHLORIDE 0.9 % IJ SOLN
10.0000 mL | INTRAMUSCULAR | Status: DC | PRN
  Filled 2015-05-15: qty 10

## 2015-05-15 MED ORDER — SODIUM CHLORIDE 0.9 % IV SOLN
INTRAVENOUS | Status: DC
  Administered 2015-05-15: 14:00:00 via INTRAVENOUS

## 2015-05-15 MED ORDER — SODIUM CHLORIDE 0.9 % IV SOLN
Freq: Once | INTRAVENOUS | Status: AC
  Administered 2015-05-15: 15:00:00 via INTRAVENOUS
  Filled 2015-05-15: qty 8

## 2015-05-15 MED FILL — ESTRADIOL 0.5 MG TABLET: 0.5 | 30 days supply | Qty: 30 | Fill #1

## 2015-05-15 NOTE — Patient Instructions (Signed)

## 2015-05-16 ENCOUNTER — Telehealth: Payer: Self-pay

## 2015-05-16 NOTE — Telephone Encounter (Signed)
Elizabeth Henry called to see if she could take 250 mg of naproxen at hs if needed to help her sleep.  She did not know what her platlet count was on 05-13-15 prior to her treatment. Told her it was fine at 257K.  Told her that her platlet count will be decreasing in the next week or so.  She needs to watch for any signs of bleeding/brusing  while taking the naproxen. She also stated that she felt slightly shaky.  She can feel this way when she does not take the effexor on time.  She was ~2 hrs late with the mediation due to sleeping in til 12 noon today. She ate a banana earlier and a plain pop tart ~1 our ago.  Suggest she eat some protein -cheese and drink some OJ.  Her BS could be low and or she needs more substitive food.  Elizabeth Henry verbalized understanding.

## 2015-05-17 ENCOUNTER — Other Ambulatory Visit: Payer: Self-pay | Admitting: Oncology

## 2015-05-20 ENCOUNTER — Ambulatory Visit (HOSPITAL_BASED_OUTPATIENT_CLINIC_OR_DEPARTMENT_OTHER): Payer: 59

## 2015-05-20 VITALS — BP 98/64 | HR 79 | Temp 98.4°F

## 2015-05-20 DIAGNOSIS — Z5189 Encounter for other specified aftercare: Secondary | ICD-10-CM

## 2015-05-20 DIAGNOSIS — C569 Malignant neoplasm of unspecified ovary: Secondary | ICD-10-CM

## 2015-05-20 MED ORDER — TBO-FILGRASTIM 300 MCG/0.5ML ~~LOC~~ SOSY
300.0000 ug | PREFILLED_SYRINGE | Freq: Once | SUBCUTANEOUS | Status: AC
  Administered 2015-05-20: 300 ug via SUBCUTANEOUS
  Filled 2015-05-20: qty 0.5

## 2015-05-21 ENCOUNTER — Telehealth: Payer: Self-pay

## 2015-05-21 ENCOUNTER — Ambulatory Visit (HOSPITAL_BASED_OUTPATIENT_CLINIC_OR_DEPARTMENT_OTHER): Payer: 59

## 2015-05-21 ENCOUNTER — Encounter: Payer: Self-pay | Admitting: Oncology

## 2015-05-21 VITALS — BP 94/62 | HR 95 | Temp 98.8°F

## 2015-05-21 DIAGNOSIS — Z5189 Encounter for other specified aftercare: Secondary | ICD-10-CM | POA: Diagnosis not present

## 2015-05-21 DIAGNOSIS — C569 Malignant neoplasm of unspecified ovary: Secondary | ICD-10-CM

## 2015-05-21 MED ORDER — TBO-FILGRASTIM 300 MCG/0.5ML ~~LOC~~ SOSY
300.0000 ug | PREFILLED_SYRINGE | Freq: Once | SUBCUTANEOUS | Status: AC
  Administered 2015-05-21: 300 ug via SUBCUTANEOUS
  Filled 2015-05-21: qty 0.5

## 2015-05-21 NOTE — Telephone Encounter (Signed)
Ms. Elizabeth Henry is to have a worker come into the house to do some maintenance tomorrow.  The person was sick 1-2 weeks ago.  Is it ok for her to have the person come tomorrow or does she need to wait until her treatments are finished? Told her that Dr. Marko Plume said that it should  be fine if the person is feeling well now.  Elizabeth Henry verbalized understanding.  WBC has remained in a good range and she just received 2 granix injections as well.

## 2015-05-23 ENCOUNTER — Encounter: Payer: Self-pay | Admitting: Oncology

## 2015-05-23 ENCOUNTER — Ambulatory Visit (HOSPITAL_BASED_OUTPATIENT_CLINIC_OR_DEPARTMENT_OTHER): Payer: 59 | Admitting: Oncology

## 2015-05-23 ENCOUNTER — Telehealth: Payer: Self-pay | Admitting: Oncology

## 2015-05-23 ENCOUNTER — Other Ambulatory Visit (HOSPITAL_BASED_OUTPATIENT_CLINIC_OR_DEPARTMENT_OTHER): Payer: 59

## 2015-05-23 VITALS — BP 100/61 | HR 92 | Temp 98.0°F | Resp 17 | Ht 63.0 in | Wt 137.0 lb

## 2015-05-23 DIAGNOSIS — D649 Anemia, unspecified: Secondary | ICD-10-CM | POA: Diagnosis not present

## 2015-05-23 DIAGNOSIS — R112 Nausea with vomiting, unspecified: Secondary | ICD-10-CM

## 2015-05-23 DIAGNOSIS — C562 Malignant neoplasm of left ovary: Secondary | ICD-10-CM | POA: Diagnosis not present

## 2015-05-23 DIAGNOSIS — F418 Other specified anxiety disorders: Secondary | ICD-10-CM

## 2015-05-23 DIAGNOSIS — C561 Malignant neoplasm of right ovary: Secondary | ICD-10-CM | POA: Diagnosis not present

## 2015-05-23 DIAGNOSIS — C563 Malignant neoplasm of bilateral ovaries: Secondary | ICD-10-CM

## 2015-05-23 DIAGNOSIS — Z7901 Long term (current) use of anticoagulants: Secondary | ICD-10-CM

## 2015-05-23 DIAGNOSIS — T451X5A Adverse effect of antineoplastic and immunosuppressive drugs, initial encounter: Secondary | ICD-10-CM

## 2015-05-23 DIAGNOSIS — Z1501 Genetic susceptibility to malignant neoplasm of breast: Secondary | ICD-10-CM

## 2015-05-23 DIAGNOSIS — C569 Malignant neoplasm of unspecified ovary: Secondary | ICD-10-CM

## 2015-05-23 DIAGNOSIS — Z1509 Genetic susceptibility to other malignant neoplasm: Secondary | ICD-10-CM

## 2015-05-23 DIAGNOSIS — I2699 Other pulmonary embolism without acute cor pulmonale: Secondary | ICD-10-CM | POA: Diagnosis not present

## 2015-05-23 DIAGNOSIS — G622 Polyneuropathy due to other toxic agents: Secondary | ICD-10-CM

## 2015-05-23 LAB — COMPREHENSIVE METABOLIC PANEL
ALT: 60 U/L — ABNORMAL HIGH (ref 0–55)
AST: 21 U/L (ref 5–34)
Albumin: 4 g/dL (ref 3.5–5.0)
Alkaline Phosphatase: 79 U/L (ref 40–150)
Anion Gap: 9 mEq/L (ref 3–11)
BUN: 7.9 mg/dL (ref 7.0–26.0)
CALCIUM: 9.6 mg/dL (ref 8.4–10.4)
CHLORIDE: 104 meq/L (ref 98–109)
CO2: 30 meq/L — AB (ref 22–29)
Creatinine: 0.9 mg/dL (ref 0.6–1.1)
EGFR: 79 mL/min/{1.73_m2} — ABNORMAL LOW (ref >90)
Glucose: 63 mg/dl — ABNORMAL LOW (ref 70–140)
POTASSIUM: 3.9 meq/L (ref 3.5–5.1)
SODIUM: 142 meq/L (ref 136–145)
Total Bilirubin: <0.3 mg/dL (ref 0.20–1.20)
Total Protein: 6.8 g/dL (ref 6.4–8.3)

## 2015-05-23 LAB — CBC WITH DIFFERENTIAL/PLATELET
BASO%: 0.2 % (ref 0.0–2.0)
Basophils Absolute: 0 10*3/uL (ref 0.0–0.1)
EOS%: 0.6 % (ref 0.0–7.0)
Eosinophils Absolute: 0.1 10*3/uL (ref 0.0–0.5)
HCT: 34.2 % — ABNORMAL LOW (ref 34.8–46.6)
HGB: 11.2 g/dL — ABNORMAL LOW (ref 11.6–15.9)
LYMPH%: 16.1 % (ref 14.0–49.7)
MCH: 32.6 pg (ref 25.1–34.0)
MCHC: 32.7 g/dL (ref 31.5–36.0)
MCV: 99.4 fL (ref 79.5–101.0)
MONO#: 0.8 10*3/uL (ref 0.1–0.9)
MONO%: 7.5 % (ref 0.0–14.0)
NEUT#: 8.5 10*3/uL — ABNORMAL HIGH (ref 1.5–6.5)
NEUT%: 75.6 % (ref 38.4–76.8)
Platelets: 257 10*3/uL (ref 145–400)
RBC: 3.44 10*6/uL — AB (ref 3.70–5.45)
RDW: 15.7 % — ABNORMAL HIGH (ref 11.2–14.5)
WBC: 11.3 10*3/uL — ABNORMAL HIGH (ref 3.9–10.3)
lymph#: 1.8 10*3/uL (ref 0.9–3.3)

## 2015-05-23 NOTE — Telephone Encounter (Signed)
Gave patient avs report and appointments for January and February  °
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
## 2015-05-27 ENCOUNTER — Other Ambulatory Visit: Payer: 59

## 2015-05-27 ENCOUNTER — Ambulatory Visit: Payer: 59 | Admitting: Oncology

## 2015-05-27 MED FILL — ENOXAPARIN 100 MG/ML SYRN: 100 | 30 days supply | Qty: 30 | Fill #0

## 2015-05-28 ENCOUNTER — Telehealth: Payer: Self-pay

## 2015-05-28 ENCOUNTER — Other Ambulatory Visit: Payer: Self-pay | Admitting: Oncology

## 2015-05-28 DIAGNOSIS — Z1501 Genetic susceptibility to malignant neoplasm of breast: Secondary | ICD-10-CM

## 2015-05-28 DIAGNOSIS — Z1509 Genetic susceptibility to other malignant neoplasm: Principal | ICD-10-CM

## 2015-05-28 NOTE — Telephone Encounter (Signed)
-----  Message from Gordy Levan, MD sent at 05/25/2015  4:54 PM EST ----- 1.  Will have labs ~ 1-27. Need to let her know if ok for chemo 1-30.  2. I did not talk with her about this yet, but please tell her that I would like to try to get breast MRIs by Feb 18. Insurance should cover due to BRCA mutation and these need to be done within 3 months of the 03-22-15 [facility] mammograms. The MRI gives no radiation exposure and is another good way to keep watch on high risk breasts for now. If ok with her, please be sure I know to put MRI order in.   thanks

## 2015-05-28 NOTE — Telephone Encounter (Signed)
Spoke with Elizabeth Henry regarding obtaining Breast MRIs as noted below by Dr. Marko Plume. She is in agreement to having MRI done. Will Notify Dr. Marko Plume so she can order MRI.

## 2015-05-29 ENCOUNTER — Telehealth: Payer: Self-pay | Admitting: Oncology

## 2015-05-29 NOTE — Telephone Encounter (Signed)
pof and order noted for mri,the location states internal and per central scheduling this will not default to them,i have left a message for louise about adding a location

## 2015-05-31 ENCOUNTER — Other Ambulatory Visit: Payer: Self-pay

## 2015-05-31 ENCOUNTER — Ambulatory Visit (HOSPITAL_BASED_OUTPATIENT_CLINIC_OR_DEPARTMENT_OTHER): Payer: 59

## 2015-05-31 ENCOUNTER — Telehealth: Payer: Self-pay

## 2015-05-31 DIAGNOSIS — Z1501 Genetic susceptibility to malignant neoplasm of breast: Secondary | ICD-10-CM

## 2015-05-31 DIAGNOSIS — Z1509 Genetic susceptibility to other malignant neoplasm: Principal | ICD-10-CM

## 2015-05-31 DIAGNOSIS — C569 Malignant neoplasm of unspecified ovary: Secondary | ICD-10-CM

## 2015-05-31 LAB — CBC WITH DIFFERENTIAL/PLATELET
BASO%: 0.2 % (ref 0.0–2.0)
Basophils Absolute: 0 10*3/uL (ref 0.0–0.1)
EOS ABS: 0 10*3/uL (ref 0.0–0.5)
EOS%: 0.7 % (ref 0.0–7.0)
HCT: 36.1 % (ref 34.8–46.6)
HGB: 12 g/dL (ref 11.6–15.9)
LYMPH%: 31.6 % (ref 14.0–49.7)
MCH: 32.4 pg (ref 25.1–34.0)
MCHC: 33.2 g/dL (ref 31.5–36.0)
MCV: 97.6 fL (ref 79.5–101.0)
MONO#: 0.2 10*3/uL (ref 0.1–0.9)
MONO%: 4.7 % (ref 0.0–14.0)
NEUT%: 62.8 % (ref 38.4–76.8)
NEUTROS ABS: 2.8 10*3/uL (ref 1.5–6.5)
PLATELETS: 201 10*3/uL (ref 145–400)
RBC: 3.7 10*6/uL (ref 3.70–5.45)
RDW: 14.2 % (ref 11.2–14.5)
WBC: 4.4 10*3/uL (ref 3.9–10.3)
lymph#: 1.4 10*3/uL (ref 0.9–3.3)

## 2015-05-31 LAB — COMPREHENSIVE METABOLIC PANEL
ALBUMIN: 4.1 g/dL (ref 3.5–5.0)
ALK PHOS: 71 U/L (ref 40–150)
ALT: 35 U/L (ref 0–55)
AST: 16 U/L (ref 5–34)
Anion Gap: 10 mEq/L (ref 3–11)
BILIRUBIN TOTAL: 0.45 mg/dL (ref 0.20–1.20)
BUN: 7 mg/dL (ref 7.0–26.0)
CO2: 28 meq/L (ref 22–29)
CREATININE: 0.9 mg/dL (ref 0.6–1.1)
Calcium: 9.6 mg/dL (ref 8.4–10.4)
Chloride: 104 mEq/L (ref 98–109)
EGFR: 75 mL/min/{1.73_m2} — AB (ref >90)
GLUCOSE: 89 mg/dL (ref 70–140)
Potassium: 3.7 mEq/L (ref 3.5–5.1)
SODIUM: 143 meq/L (ref 136–145)
TOTAL PROTEIN: 7.3 g/dL (ref 6.4–8.3)

## 2015-05-31 NOTE — Telephone Encounter (Signed)
Told Elizabeth Henry that her [place] = 2.8; Platelets = 201K; Chemistries are fine for her treatment on 06-03-15.  Told Elizabeth Henry that the Breast MRI order had a glitch in it and needed to be reentered.  She should hear from the schedulers next week about appointment. Elizabeth Henry verbalized understanding.

## 2015-05-31 NOTE — Telephone Encounter (Signed)
-----  Message from Gordy Levan, MD sent at 05/25/2015  4:54 PM EST ----- 1.  Will have labs ~ 1-27. Need to let her know if ok for chemo 1-30.  2. I did not talk with her about this yet, but please tell her that I would like to try to get breast MRIs by Feb 18. Insurance should cover due to BRCA mutation and these need to be done within 3 months of the 03-22-15 [facility] mammograms. The MRI gives no radiation exposure and is another good way to keep watch on high risk breasts for now. If ok with her, please be sure I know to put MRI order in.   thanks

## 2015-06-01 LAB — CA 125: CANCER ANTIGEN (CA) 125: 11.7 U/mL (ref 0.0–38.1)

## 2015-06-03 ENCOUNTER — Encounter: Payer: Self-pay | Admitting: Gynecologic Oncology

## 2015-06-03 ENCOUNTER — Encounter: Payer: Self-pay | Admitting: Oncology

## 2015-06-03 ENCOUNTER — Ambulatory Visit (HOSPITAL_BASED_OUTPATIENT_CLINIC_OR_DEPARTMENT_OTHER): Payer: 59

## 2015-06-03 VITALS — BP 116/69 | HR 98 | Temp 98.1°F | Resp 18

## 2015-06-03 DIAGNOSIS — C569 Malignant neoplasm of unspecified ovary: Secondary | ICD-10-CM | POA: Diagnosis not present

## 2015-06-03 DIAGNOSIS — Z5111 Encounter for antineoplastic chemotherapy: Secondary | ICD-10-CM

## 2015-06-03 LAB — CANCER ANTIGEN 125 (PARALLEL TESTING): CA 125: 8 U/mL (ref <35)

## 2015-06-03 MED ORDER — PACLITAXEL CHEMO INJECTION 300 MG/50ML
175.0000 mg/m2 | Freq: Once | INTRAVENOUS | Status: AC
  Administered 2015-06-03: 288 mg via INTRAVENOUS
  Filled 2015-06-03: qty 48

## 2015-06-03 MED ORDER — PROMETHAZINE HCL 25 MG/ML IJ SOLN
12.5000 mg | Freq: Once | INTRAMUSCULAR | Status: DC | PRN
Start: 2015-06-03 — End: 2015-06-03

## 2015-06-03 MED ORDER — SODIUM CHLORIDE 0.9 % IV SOLN
Freq: Once | INTRAVENOUS | Status: AC
  Administered 2015-06-03: 13:00:00 via INTRAVENOUS
  Filled 2015-06-03: qty 8

## 2015-06-03 MED ORDER — SODIUM CHLORIDE 0.9 % IV SOLN
Freq: Once | INTRAVENOUS | Status: AC
  Administered 2015-06-03: 13:00:00 via INTRAVENOUS
  Filled 2015-06-03: qty 5

## 2015-06-03 MED ORDER — SODIUM CHLORIDE 0.9 % IV SOLN
Freq: Once | INTRAVENOUS | Status: AC
  Administered 2015-06-03: 12:00:00 via INTRAVENOUS

## 2015-06-03 MED ORDER — FAMOTIDINE IN NACL 20-0.9 MG/50ML-% IV SOLN
INTRAVENOUS | Status: AC
  Filled 2015-06-03: qty 50

## 2015-06-03 MED ORDER — PROMETHAZINE HCL 25 MG/ML IJ SOLN: 12.5000 mg | Freq: Once | INTRAMUSCULAR | Status: DC | PRN

## 2015-06-03 MED ORDER — DIPHENHYDRAMINE HCL 50 MG/ML IJ SOLN
INTRAMUSCULAR | Status: AC
  Filled 2015-06-03: qty 1

## 2015-06-03 MED ORDER — FAMOTIDINE IN NACL 20-0.9 MG/50ML-% IV SOLN
20.0000 mg | Freq: Once | INTRAVENOUS | Status: AC
  Administered 2015-06-03: 20 mg via INTRAVENOUS

## 2015-06-03 MED ORDER — DIPHENHYDRAMINE HCL 50 MG/ML IJ SOLN
50.0000 mg | Freq: Once | INTRAMUSCULAR | Status: AC
  Administered 2015-06-03: 50 mg via INTRAVENOUS

## 2015-06-03 MED ORDER — SODIUM CHLORIDE 0.9 % IV SOLN
610.0000 mg | Freq: Once | INTRAVENOUS | Status: AC
  Administered 2015-06-03: 610 mg via INTRAVENOUS
  Filled 2015-06-03: qty 61

## 2015-06-03 NOTE — Patient Instructions (Signed)
[facility] Discharge Instructions for Patients Receiving Chemotherapy  Today you received the following chemotherapy agents: Taxol and Craboplatin   To help prevent nausea and vomiting after your treatment, we encourage you to take your nausea medication as directed.    If you develop nausea and vomiting that is not controlled by your nausea medication, call the clinic.   BELOW ARE SYMPTOMS THAT SHOULD BE REPORTED IMMEDIATELY:  *FEVER GREATER THAN 100.5 F  *CHILLS WITH OR WITHOUT FEVER  NAUSEA AND VOMITING THAT IS NOT CONTROLLED WITH YOUR NAUSEA MEDICATION  *UNUSUAL SHORTNESS OF BREATH  *UNUSUAL BRUISING OR BLEEDING  TENDERNESS IN MOUTH AND THROAT WITH OR WITHOUT PRESENCE OF ULCERS  *URINARY PROBLEMS  *BOWEL PROBLEMS  UNUSUAL RASH Items with * indicate a potential emergency and should be followed up as soon as possible.  Feel free to call the clinic you have any questions or concerns. The clinic phone number is [phone].  Please show the [place] at check-in to the Emergency Department and triage nurse.

## 2015-06-04 ENCOUNTER — Encounter: Payer: Self-pay | Admitting: Oncology

## 2015-06-04 NOTE — Telephone Encounter (Signed)
Elizabeth Henry called back stating that she called [facility] and rescheduled Bilateral Breast Mri for 06-13-15 at 1500.

## 2015-06-05 ENCOUNTER — Ambulatory Visit (HOSPITAL_BASED_OUTPATIENT_CLINIC_OR_DEPARTMENT_OTHER): Payer: 59

## 2015-06-05 ENCOUNTER — Telehealth: Payer: Self-pay

## 2015-06-05 VITALS — BP 92/55 | HR 81 | Temp 98.5°F | Resp 16

## 2015-06-05 DIAGNOSIS — E860 Dehydration: Secondary | ICD-10-CM | POA: Diagnosis not present

## 2015-06-05 DIAGNOSIS — C569 Malignant neoplasm of unspecified ovary: Secondary | ICD-10-CM | POA: Diagnosis not present

## 2015-06-05 MED ORDER — SODIUM CHLORIDE 0.9 % IV SOLN
Freq: Once | INTRAVENOUS | Status: AC
  Administered 2015-06-05: 14:00:00 via INTRAVENOUS

## 2015-06-05 MED ORDER — SODIUM CHLORIDE 0.9 % IV SOLN
Freq: Once | INTRAVENOUS | Status: AC
  Administered 2015-06-05: 14:00:00 via INTRAVENOUS
  Filled 2015-06-05: qty 8

## 2015-06-05 NOTE — Telephone Encounter (Signed)
Dr. Denman George received the same E-mail and responed to Ms. Sorensen statin that the Nioxin was fine for her to use.

## 2015-06-05 NOTE — Telephone Encounter (Signed)
Told Elizabeth Henry the results of the CA-125 as noted below by Dr. Marko Plume.

## 2015-06-05 NOTE — Telephone Encounter (Signed)
-----   Message from Gordy Levan, MD sent at 06/05/2015  9:24 AM EST ----- Labs seen and need follow up: please let her know CA 125 down to 8 (or 11 by new lab method). She comes for IVF today 2-1 @ 1:15

## 2015-06-05 NOTE — Patient Instructions (Signed)

## 2015-06-06 ENCOUNTER — Other Ambulatory Visit: Payer: Self-pay | Admitting: Oncology

## 2015-06-07 ENCOUNTER — Encounter: Payer: Self-pay | Admitting: Oncology

## 2015-06-07 NOTE — Telephone Encounter (Signed)
Spoke with Elizabeth Henry on the phone and told her that these symptoms are a cumulative effect from the Taxol Chemotherapy.  She can use a Naprosyn 250 or 500 today and tomorrow if needed.  Then use Tylenol as PLTS. May start to decrease. She can use the tramadol if needed to help her sleep.  Suggested heating pad.  She is taking claritin daily.

## 2015-06-09 ENCOUNTER — Encounter: Payer: Self-pay | Admitting: Oncology

## 2015-06-10 ENCOUNTER — Other Ambulatory Visit (HOSPITAL_BASED_OUTPATIENT_CLINIC_OR_DEPARTMENT_OTHER): Payer: 59

## 2015-06-10 ENCOUNTER — Other Ambulatory Visit: Payer: Self-pay | Admitting: Oncology

## 2015-06-10 ENCOUNTER — Ambulatory Visit (HOSPITAL_BASED_OUTPATIENT_CLINIC_OR_DEPARTMENT_OTHER): Payer: 59

## 2015-06-10 VITALS — BP 90/77 | HR 94 | Temp 98.4°F

## 2015-06-10 DIAGNOSIS — C569 Malignant neoplasm of unspecified ovary: Secondary | ICD-10-CM | POA: Diagnosis not present

## 2015-06-10 DIAGNOSIS — Z5189 Encounter for other specified aftercare: Secondary | ICD-10-CM

## 2015-06-10 LAB — CBC WITH DIFFERENTIAL/PLATELET
BASO%: 0.4 % (ref 0.0–2.0)
BASOS ABS: 0 10*3/uL (ref 0.0–0.1)
EOS%: 0.8 % (ref 0.0–7.0)
Eosinophils Absolute: 0 10*3/uL (ref 0.0–0.5)
HEMATOCRIT: 29.8 % — AB (ref 34.8–46.6)
HGB: 10 g/dL — ABNORMAL LOW (ref 11.6–15.9)
LYMPH%: 52.3 % — AB (ref 14.0–49.7)
MCH: 32.7 pg (ref 25.1–34.0)
MCHC: 33.6 g/dL (ref 31.5–36.0)
MCV: 97.4 fL (ref 79.5–101.0)
MONO#: 0.1 10*3/uL (ref 0.1–0.9)
MONO%: 3.5 % (ref 0.0–14.0)
NEUT#: 1.1 10*3/uL — ABNORMAL LOW (ref 1.5–6.5)
NEUT%: 43 % (ref 38.4–76.8)
Platelets: 109 10*3/uL — ABNORMAL LOW (ref 145–400)
RBC: 3.06 10*6/uL — ABNORMAL LOW (ref 3.70–5.45)
RDW: 12.6 % (ref 11.2–14.5)
WBC: 2.6 10*3/uL — ABNORMAL LOW (ref 3.9–10.3)
lymph#: 1.3 10*3/uL (ref 0.9–3.3)

## 2015-06-10 LAB — BASIC METABOLIC PANEL
Anion Gap: 9 mEq/L (ref 3–11)
BUN: 11.7 mg/dL (ref 7.0–26.0)
CHLORIDE: 105 meq/L (ref 98–109)
CO2: 26 meq/L (ref 22–29)
CREATININE: 0.8 mg/dL (ref 0.6–1.1)
Calcium: 8.9 mg/dL (ref 8.4–10.4)
EGFR: >90 mL/min/{1.73_m2} (ref >90)
Glucose: 70 mg/dl (ref 70–140)
POTASSIUM: 3.7 meq/L (ref 3.5–5.1)
SODIUM: 141 meq/L (ref 136–145)

## 2015-06-10 MED ORDER — LORAZEPAM 1 MG PO TABS: ORAL_TABLET | ORAL | Status: DC

## 2015-06-10 MED ORDER — DEXAMETHASONE 4 MG PO TABS: ORAL_TABLET | ORAL | Status: DC

## 2015-06-10 MED ORDER — TBO-FILGRASTIM 300 MCG/0.5ML ~~LOC~~ SOSY
300.0000 ug | PREFILLED_SYRINGE | Freq: Once | SUBCUTANEOUS | Status: AC
  Administered 2015-06-10: 300 ug via SUBCUTANEOUS
  Filled 2015-06-10: qty 0.5

## 2015-06-10 NOTE — Telephone Encounter (Signed)
LM for Ativan refill in [facility]'s Voice Mail

## 2015-06-11 ENCOUNTER — Ambulatory Visit (HOSPITAL_BASED_OUTPATIENT_CLINIC_OR_DEPARTMENT_OTHER): Payer: 59

## 2015-06-11 VITALS — BP 99/65 | HR 71 | Temp 98.3°F

## 2015-06-11 DIAGNOSIS — Z5189 Encounter for other specified aftercare: Secondary | ICD-10-CM

## 2015-06-11 DIAGNOSIS — C569 Malignant neoplasm of unspecified ovary: Secondary | ICD-10-CM

## 2015-06-11 MED ORDER — TBO-FILGRASTIM 300 MCG/0.5ML ~~LOC~~ SOSY
300.0000 ug | PREFILLED_SYRINGE | Freq: Once | SUBCUTANEOUS | Status: AC
  Administered 2015-06-11: 300 ug via SUBCUTANEOUS
  Filled 2015-06-11: qty 0.5

## 2015-06-11 MED FILL — DEXAMETHASONE 4 MG TABLET: 4 | 1 days supply | Qty: 10 | Fill #0

## 2015-06-12 ENCOUNTER — Telehealth: Payer: Self-pay

## 2015-06-12 ENCOUNTER — Other Ambulatory Visit: Payer: 59

## 2015-06-12 ENCOUNTER — Ambulatory Visit (HOSPITAL_BASED_OUTPATIENT_CLINIC_OR_DEPARTMENT_OTHER): Payer: 59

## 2015-06-12 VITALS — BP 110/85 | HR 94 | Temp 98.6°F

## 2015-06-12 DIAGNOSIS — Z5189 Encounter for other specified aftercare: Secondary | ICD-10-CM | POA: Diagnosis not present

## 2015-06-12 DIAGNOSIS — C569 Malignant neoplasm of unspecified ovary: Secondary | ICD-10-CM

## 2015-06-12 MED ORDER — TBO-FILGRASTIM 300 MCG/0.5ML ~~LOC~~ SOSY
300.0000 ug | PREFILLED_SYRINGE | Freq: Once | SUBCUTANEOUS | Status: AC
  Administered 2015-06-12: 300 ug via SUBCUTANEOUS
  Filled 2015-06-12: qty 0.5

## 2015-06-12 MED FILL — FERROCITE TABLET: 324 | 30 days supply | Qty: 30 | Fill #2

## 2015-06-12 MED FILL — ESTRADIOL 0.5 MG TABLET: 0.5 | 30 days supply | Qty: 30 | Fill #2

## 2015-06-12 NOTE — Telephone Encounter (Signed)
Confirmed this evening that she will be able to make Breast MRI appointment on 06-17-15 at [facility] in [place]. [phone]. Appointment time [phone] with 0915 arrival. Office:[phone].  Scheduling department [phone] Fax: [phone] Prior authorization done by Benedetto Goad.  On 06-13-15 need to fax signed order to [facility] along with prior authorization number Ms. Enerson to bring disc from [facility] of mammogram done on 03-22-15 to MRI appointment. Cancelled [facility] Breast MRI appointment for 06-21-15 as the scanner will be down until 06-27-15. Pt. Going to [facility] as it is in her [employer].

## 2015-06-13 ENCOUNTER — Ambulatory Visit (HOSPITAL_COMMUNITY): Payer: 59

## 2015-06-14 ENCOUNTER — Telehealth: Payer: Self-pay

## 2015-06-14 NOTE — Telephone Encounter (Signed)
LVM reminding pt to take disc from [facility] with her to [facility] for her MRI on Monday. The signed orders and PA number were faxed on 2/9.

## 2015-06-14 NOTE — Telephone Encounter (Signed)
[facility] called to find out what type of filter was placed in patient. Per imaging a Denali IVC filter was placed. Per [facility], this will not be a barrier for MRI. Confirmed they did receive the orders faxed over yesterday.

## 2015-06-16 ENCOUNTER — Other Ambulatory Visit: Payer: Self-pay | Admitting: Oncology

## 2015-06-17 ENCOUNTER — Ambulatory Visit (HOSPITAL_BASED_OUTPATIENT_CLINIC_OR_DEPARTMENT_OTHER): Payer: 59 | Admitting: Oncology

## 2015-06-17 ENCOUNTER — Telehealth: Payer: Self-pay | Admitting: Oncology

## 2015-06-17 ENCOUNTER — Other Ambulatory Visit: Payer: Self-pay | Admitting: Oncology

## 2015-06-17 ENCOUNTER — Encounter: Payer: Self-pay | Admitting: Oncology

## 2015-06-17 ENCOUNTER — Other Ambulatory Visit (HOSPITAL_BASED_OUTPATIENT_CLINIC_OR_DEPARTMENT_OTHER): Payer: 59

## 2015-06-17 VITALS — BP 98/61 | HR 85 | Temp 97.8°F | Resp 18 | Ht 63.0 in | Wt 140.9 lb

## 2015-06-17 DIAGNOSIS — C562 Malignant neoplasm of left ovary: Secondary | ICD-10-CM

## 2015-06-17 DIAGNOSIS — T451X5A Adverse effect of antineoplastic and immunosuppressive drugs, initial encounter: Secondary | ICD-10-CM

## 2015-06-17 DIAGNOSIS — F418 Other specified anxiety disorders: Secondary | ICD-10-CM

## 2015-06-17 DIAGNOSIS — Z1501 Genetic susceptibility to malignant neoplasm of breast: Secondary | ICD-10-CM

## 2015-06-17 DIAGNOSIS — C563 Malignant neoplasm of bilateral ovaries: Secondary | ICD-10-CM

## 2015-06-17 DIAGNOSIS — K5909 Other constipation: Secondary | ICD-10-CM

## 2015-06-17 DIAGNOSIS — C561 Malignant neoplasm of right ovary: Secondary | ICD-10-CM

## 2015-06-17 DIAGNOSIS — Z1509 Genetic susceptibility to other malignant neoplasm: Secondary | ICD-10-CM

## 2015-06-17 DIAGNOSIS — G620 Drug-induced polyneuropathy: Secondary | ICD-10-CM

## 2015-06-17 DIAGNOSIS — R112 Nausea with vomiting, unspecified: Secondary | ICD-10-CM

## 2015-06-17 DIAGNOSIS — D649 Anemia, unspecified: Secondary | ICD-10-CM

## 2015-06-17 DIAGNOSIS — I2699 Other pulmonary embolism without acute cor pulmonale: Secondary | ICD-10-CM | POA: Diagnosis not present

## 2015-06-17 DIAGNOSIS — C569 Malignant neoplasm of unspecified ovary: Secondary | ICD-10-CM

## 2015-06-17 DIAGNOSIS — R110 Nausea: Secondary | ICD-10-CM

## 2015-06-17 DIAGNOSIS — G622 Polyneuropathy due to other toxic agents: Secondary | ICD-10-CM

## 2015-06-17 DIAGNOSIS — D6481 Anemia due to antineoplastic chemotherapy: Secondary | ICD-10-CM

## 2015-06-17 DIAGNOSIS — Z7901 Long term (current) use of anticoagulants: Secondary | ICD-10-CM

## 2015-06-17 LAB — COMPREHENSIVE METABOLIC PANEL
ALBUMIN: 3.8 g/dL (ref 3.5–5.0)
ALK PHOS: 84 U/L (ref 40–150)
ALT: 66 U/L — ABNORMAL HIGH (ref 0–55)
ANION GAP: 10 meq/L (ref 3–11)
AST: 23 U/L (ref 5–34)
BUN: 10 mg/dL (ref 7.0–26.0)
CALCIUM: 9.2 mg/dL (ref 8.4–10.4)
CO2: 28 mEq/L (ref 22–29)
CREATININE: 0.9 mg/dL (ref 0.6–1.1)
Chloride: 104 mEq/L (ref 98–109)
EGFR: 83 mL/min/{1.73_m2} — ABNORMAL LOW (ref >90)
Glucose: 102 mg/dl (ref 70–140)
POTASSIUM: 3.7 meq/L (ref 3.5–5.1)
SODIUM: 142 meq/L (ref 136–145)
TOTAL PROTEIN: 6.8 g/dL (ref 6.4–8.3)
Total Bilirubin: <0.3 mg/dL (ref 0.20–1.20)

## 2015-06-17 LAB — CBC WITH DIFFERENTIAL/PLATELET
BASO%: 0.6 % (ref 0.0–2.0)
Basophils Absolute: 0 10*3/uL (ref 0.0–0.1)
EOS%: 1 % (ref 0.0–7.0)
Eosinophils Absolute: 0 10*3/uL (ref 0.0–0.5)
HCT: 33.8 % — ABNORMAL LOW (ref 34.8–46.6)
HEMOGLOBIN: 11.1 g/dL — AB (ref 11.6–15.9)
LYMPH%: 47.6 % (ref 14.0–49.7)
MCH: 32.6 pg (ref 25.1–34.0)
MCHC: 32.8 g/dL (ref 31.5–36.0)
MCV: 99.2 fL (ref 79.5–101.0)
MONO#: 0.3 10*3/uL (ref 0.1–0.9)
MONO%: 8.3 % (ref 0.0–14.0)
NEUT%: 42.5 % (ref 38.4–76.8)
NEUTROS ABS: 1.5 10*3/uL (ref 1.5–6.5)
Platelets: 155 10*3/uL (ref 145–400)
RBC: 3.41 10*6/uL — ABNORMAL LOW (ref 3.70–5.45)
RDW: 13 % (ref 11.2–14.5)
WBC: 3.5 10*3/uL — AB (ref 3.9–10.3)
lymph#: 1.7 10*3/uL (ref 0.9–3.3)

## 2015-06-17 MED ORDER — PROMETHAZINE HCL 25 MG PO TABS: 25.0000 mg | ORAL_TABLET | Freq: Four times a day (QID) | ORAL | Status: DC | PRN

## 2015-06-17 MED ORDER — ENOXAPARIN SODIUM 100 MG/ML ~~LOC~~ SOLN: 95.0000 mg | SUBCUTANEOUS | Status: DC
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
## 2015-06-17 NOTE — Telephone Encounter (Signed)
Gave patient avs report and appointments for February and March. Per 2/13 pof image orders entered - no order found - message sent informing LL.

## 2015-06-18 ENCOUNTER — Encounter: Payer: Self-pay | Admitting: Oncology

## 2015-06-18 ENCOUNTER — Telehealth: Payer: Self-pay

## 2015-06-18 DIAGNOSIS — T451X5A Adverse effect of antineoplastic and immunosuppressive drugs, initial encounter: Secondary | ICD-10-CM

## 2015-06-18 DIAGNOSIS — G620 Drug-induced polyneuropathy: Secondary | ICD-10-CM | POA: Insufficient documentation

## 2015-06-18 NOTE — Telephone Encounter (Signed)
Re stuffy nose, family members ill  Push po fluids OK to use mucinex if thick nasal drainage or thick sputum. OK to use robitussin or robitussin pm if cough. Those meds could improve symptoms as noted, but will not resolve a URI any more quickly.  thanks

## 2015-06-18 NOTE — Telephone Encounter (Signed)
>','<<   Less Detail',event)" href="javascript:;">More Detail >>   Non-Urgent Medical Question    Elizabeth Henry    Sent: Tue June 18, 2015 10:28 AM    To: Nobie Putnam Nurse Cc        Message     Hey Dr Marko Plume     I did get a stuffy nose over night. What can I take? I have mucinex, robitussin and robitussin pm.         Thanks     [facility] Size     Small Medium Large Extra Extra Large    Elizabeth Henry  06/18/2015  Patient Email  MRN:  [id number]   Description: 45 year old female  Provider: Gordy Levan, MD  Department: [facility]          Call Documentation     No notes of this type exist for this encounter.     Encounter MyChart Messages     Read Composed From To Subject    Y 06/18/2015 10:28 AM Elizabeth Henry Gordy Levan, MD Non-Urgent Medical Question      Created by     Gordy Levan, MD on 06/18/2015 10:28 AM

## 2015-06-18 NOTE — Telephone Encounter (Signed)
Called patient.  See phone note 06-18-15.

## 2015-06-18 NOTE — Telephone Encounter (Signed)
Spoke with Ms. Martire regarding the interventions noted below by Dr. Marko Plume.

## 2015-06-19 NOTE — Telephone Encounter (Signed)
Per response from LL - ct being coordinated with Dr. Denman George and after decision made order will be entered and new pof sent.

## 2015-06-21 ENCOUNTER — Ambulatory Visit (HOSPITAL_COMMUNITY): Payer: 59

## 2015-06-24 ENCOUNTER — Other Ambulatory Visit (HOSPITAL_BASED_OUTPATIENT_CLINIC_OR_DEPARTMENT_OTHER): Payer: 59

## 2015-06-24 ENCOUNTER — Ambulatory Visit (HOSPITAL_BASED_OUTPATIENT_CLINIC_OR_DEPARTMENT_OTHER): Payer: 59

## 2015-06-24 VITALS — BP 115/79 | HR 95 | Temp 97.9°F | Resp 16

## 2015-06-24 DIAGNOSIS — C561 Malignant neoplasm of right ovary: Secondary | ICD-10-CM | POA: Diagnosis not present

## 2015-06-24 DIAGNOSIS — C562 Malignant neoplasm of left ovary: Secondary | ICD-10-CM | POA: Diagnosis not present

## 2015-06-24 DIAGNOSIS — C563 Malignant neoplasm of bilateral ovaries: Secondary | ICD-10-CM

## 2015-06-24 DIAGNOSIS — C569 Malignant neoplasm of unspecified ovary: Secondary | ICD-10-CM | POA: Diagnosis not present

## 2015-06-24 DIAGNOSIS — Z5111 Encounter for antineoplastic chemotherapy: Secondary | ICD-10-CM | POA: Diagnosis not present

## 2015-06-24 LAB — COMPREHENSIVE METABOLIC PANEL
ALBUMIN: 3.9 g/dL (ref 3.5–5.0)
ALK PHOS: 88 U/L (ref 40–150)
ALT: 33 U/L (ref 0–55)
AST: 17 U/L (ref 5–34)
Anion Gap: 13 mEq/L — ABNORMAL HIGH (ref 3–11)
BUN: 11.8 mg/dL (ref 7.0–26.0)
CO2: 24 meq/L (ref 22–29)
Calcium: 9.7 mg/dL (ref 8.4–10.4)
Chloride: 104 mEq/L (ref 98–109)
Creatinine: 0.9 mg/dL (ref 0.6–1.1)
EGFR: 77 mL/min/{1.73_m2} — AB (ref >90)
GLUCOSE: 219 mg/dL — AB (ref 70–140)
POTASSIUM: 4.1 meq/L (ref 3.5–5.1)
SODIUM: 141 meq/L (ref 136–145)
Total Protein: 7.3 g/dL (ref 6.4–8.3)

## 2015-06-24 LAB — CBC WITH DIFFERENTIAL/PLATELET
BASO%: 0.1 % (ref 0.0–2.0)
BASOS ABS: 0 10*3/uL (ref 0.0–0.1)
EOS ABS: 0 10*3/uL (ref 0.0–0.5)
EOS%: 0 % (ref 0.0–7.0)
HCT: 33.6 % — ABNORMAL LOW (ref 34.8–46.6)
HGB: 11.3 g/dL — ABNORMAL LOW (ref 11.6–15.9)
LYMPH%: 11.3 % — AB (ref 14.0–49.7)
MCH: 33.2 pg (ref 25.1–34.0)
MCHC: 33.6 g/dL (ref 31.5–36.0)
MCV: 98.7 fL (ref 79.5–101.0)
MONO#: 0 10*3/uL — ABNORMAL LOW (ref 0.1–0.9)
MONO%: 1.2 % (ref 0.0–14.0)
NEUT#: 2.8 10*3/uL (ref 1.5–6.5)
NEUT%: 87.4 % — ABNORMAL HIGH (ref 38.4–76.8)
Platelets: 209 10*3/uL (ref 145–400)
RBC: 3.4 10*6/uL — AB (ref 3.70–5.45)
RDW: 13.4 % (ref 11.2–14.5)
WBC: 3.1 10*3/uL — ABNORMAL LOW (ref 3.9–10.3)
lymph#: 0.4 10*3/uL — ABNORMAL LOW (ref 0.9–3.3)

## 2015-06-24 MED ORDER — PALONOSETRON HCL INJECTION 0.25 MG/5ML
INTRAVENOUS | Status: AC
  Filled 2015-06-24: qty 5

## 2015-06-24 MED ORDER — PALONOSETRON HCL INJECTION 0.25 MG/5ML
0.2500 mg | Freq: Once | INTRAVENOUS | Status: AC
  Administered 2015-06-24: 0.25 mg via INTRAVENOUS

## 2015-06-24 MED ORDER — SODIUM CHLORIDE 0.9 % IV SOLN
Freq: Once | INTRAVENOUS | Status: AC
Start: 2015-06-24 — End: 2015-06-24
  Administered 2015-06-24: 10:00:00 via INTRAVENOUS

## 2015-06-24 MED ORDER — DIPHENHYDRAMINE HCL 50 MG/ML IJ SOLN
INTRAMUSCULAR | Status: AC
  Filled 2015-06-24: qty 1

## 2015-06-24 MED ORDER — FAMOTIDINE IN NACL 20-0.9 MG/50ML-% IV SOLN
INTRAVENOUS | Status: AC
  Filled 2015-06-24: qty 50

## 2015-06-24 MED ORDER — FAMOTIDINE IN NACL 20-0.9 MG/50ML-% IV SOLN
20.0000 mg | Freq: Once | INTRAVENOUS | Status: AC
  Administered 2015-06-24: 20 mg via INTRAVENOUS

## 2015-06-24 MED ORDER — FOSAPREPITANT DIMEGLUMINE INJECTION 150 MG
Freq: Once | INTRAVENOUS | Status: AC
  Administered 2015-06-24: 11:00:00 via INTRAVENOUS
  Filled 2015-06-24: qty 5

## 2015-06-24 MED ORDER — SODIUM CHLORIDE 0.9 % IV SOLN
609.0000 mg | Freq: Once | INTRAVENOUS | Status: AC
  Administered 2015-06-24: 610 mg via INTRAVENOUS
  Filled 2015-06-24: qty 61

## 2015-06-24 MED ORDER — DIPHENHYDRAMINE HCL 50 MG/ML IJ SOLN
50.0000 mg | Freq: Once | INTRAMUSCULAR | Status: AC
  Administered 2015-06-24: 50 mg via INTRAVENOUS

## 2015-06-24 MED ORDER — DEXTROSE 5 % IV SOLN
175.0000 mg/m2 | Freq: Once | INTRAVENOUS | Status: AC
  Administered 2015-06-24: 288 mg via INTRAVENOUS
  Filled 2015-06-24: qty 48

## 2015-06-24 NOTE — Patient Instructions (Signed)
[place] Discharge Instructions for Patients Receiving Chemotherapy  Today you received the following chemotherapy agents: Taxol and Carboplatin.  To help prevent nausea and vomiting after your treatment, we encourage you to take your nausea medication: Zofran 8 mg every 8 hours; Phenergan 25 mg every 6 hours as needed.   If you develop nausea and vomiting that is not controlled by your nausea medication, call the clinic.   BELOW ARE SYMPTOMS THAT SHOULD BE REPORTED IMMEDIATELY:  *FEVER GREATER THAN 100.5 F  *CHILLS WITH OR WITHOUT FEVER  NAUSEA AND VOMITING THAT IS NOT CONTROLLED WITH YOUR NAUSEA MEDICATION  *UNUSUAL SHORTNESS OF BREATH  *UNUSUAL BRUISING OR BLEEDING  TENDERNESS IN MOUTH AND THROAT WITH OR WITHOUT PRESENCE OF ULCERS  *URINARY PROBLEMS  *BOWEL PROBLEMS  UNUSUAL RASH Items with * indicate a potential emergency and should be followed up as soon as possible.  Feel free to call the clinic you have any questions or concerns. The clinic phone number is [phone].  Please show the [place] at check-in to the Emergency Department and triage nurse.

## 2015-06-25 MED FILL — ENOXAPARIN 100 MG/ML SYRN: 100 | 30 days supply | Qty: 30 | Fill #1

## 2015-06-26 ENCOUNTER — Ambulatory Visit (HOSPITAL_BASED_OUTPATIENT_CLINIC_OR_DEPARTMENT_OTHER): Payer: 59

## 2015-06-26 VITALS — BP 94/56 | HR 81 | Temp 97.8°F | Resp 18

## 2015-06-26 DIAGNOSIS — C561 Malignant neoplasm of right ovary: Secondary | ICD-10-CM

## 2015-06-26 DIAGNOSIS — R110 Nausea: Secondary | ICD-10-CM

## 2015-06-26 DIAGNOSIS — C569 Malignant neoplasm of unspecified ovary: Secondary | ICD-10-CM

## 2015-06-26 DIAGNOSIS — C562 Malignant neoplasm of left ovary: Secondary | ICD-10-CM | POA: Diagnosis not present

## 2015-06-26 MED ORDER — SODIUM CHLORIDE 0.9 % IV SOLN
INTRAVENOUS | Status: DC
  Administered 2015-06-26: 13:00:00 via INTRAVENOUS

## 2015-06-26 MED ORDER — PROMETHAZINE HCL 25 MG/ML IJ SOLN
12.5000 mg | Freq: Once | INTRAMUSCULAR | Status: AC | PRN
  Administered 2015-06-26: 25 mg via INTRAVENOUS
  Filled 2015-06-26: qty 1

## 2015-06-26 NOTE — Patient Instructions (Signed)

## 2015-07-01 ENCOUNTER — Ambulatory Visit (HOSPITAL_BASED_OUTPATIENT_CLINIC_OR_DEPARTMENT_OTHER): Payer: 59

## 2015-07-01 VITALS — BP 103/62 | HR 93 | Temp 98.2°F

## 2015-07-01 DIAGNOSIS — C562 Malignant neoplasm of left ovary: Secondary | ICD-10-CM

## 2015-07-01 DIAGNOSIS — Z5189 Encounter for other specified aftercare: Secondary | ICD-10-CM | POA: Diagnosis not present

## 2015-07-01 DIAGNOSIS — C561 Malignant neoplasm of right ovary: Secondary | ICD-10-CM

## 2015-07-01 DIAGNOSIS — C569 Malignant neoplasm of unspecified ovary: Secondary | ICD-10-CM

## 2015-07-01 MED ORDER — TBO-FILGRASTIM 300 MCG/0.5ML ~~LOC~~ SOSY
300.0000 ug | PREFILLED_SYRINGE | Freq: Once | SUBCUTANEOUS | Status: AC
  Administered 2015-07-01: 300 ug via SUBCUTANEOUS
  Filled 2015-07-01: qty 0.5

## 2015-07-02 ENCOUNTER — Encounter: Payer: Self-pay | Admitting: Oncology

## 2015-07-02 ENCOUNTER — Ambulatory Visit (HOSPITAL_BASED_OUTPATIENT_CLINIC_OR_DEPARTMENT_OTHER): Payer: 59

## 2015-07-02 VITALS — BP 111/70 | HR 81 | Temp 98.3°F

## 2015-07-02 DIAGNOSIS — Z5189 Encounter for other specified aftercare: Secondary | ICD-10-CM

## 2015-07-02 DIAGNOSIS — C569 Malignant neoplasm of unspecified ovary: Secondary | ICD-10-CM

## 2015-07-02 DIAGNOSIS — C561 Malignant neoplasm of right ovary: Secondary | ICD-10-CM | POA: Diagnosis not present

## 2015-07-02 DIAGNOSIS — C562 Malignant neoplasm of left ovary: Secondary | ICD-10-CM | POA: Diagnosis not present

## 2015-07-02 MED ORDER — TBO-FILGRASTIM 300 MCG/0.5ML ~~LOC~~ SOSY
300.0000 ug | PREFILLED_SYRINGE | Freq: Once | SUBCUTANEOUS | Status: AC
  Administered 2015-07-02: 300 ug via SUBCUTANEOUS
  Filled 2015-07-02: qty 0.5

## 2015-07-02 NOTE — Progress Notes (Signed)
Left a voice message in response to non urgent medical question. 1 she should have received a card from when they placed IVC filter. It was most likely in her going home paperwork. 2 the results of MRI are not visible in epic chart. 3. The eye should be looked at tomorrow at [facility] when she gets her injection or see an eye MD tomorrow. This is if it is not painful or worsening.  Please call us back.

## 2015-07-03 ENCOUNTER — Ambulatory Visit (HOSPITAL_BASED_OUTPATIENT_CLINIC_OR_DEPARTMENT_OTHER): Payer: 59

## 2015-07-03 ENCOUNTER — Telehealth: Payer: Self-pay

## 2015-07-03 VITALS — BP 106/59 | HR 78 | Temp 98.3°F

## 2015-07-03 DIAGNOSIS — Z5189 Encounter for other specified aftercare: Secondary | ICD-10-CM

## 2015-07-03 DIAGNOSIS — C562 Malignant neoplasm of left ovary: Secondary | ICD-10-CM | POA: Diagnosis not present

## 2015-07-03 DIAGNOSIS — C561 Malignant neoplasm of right ovary: Secondary | ICD-10-CM | POA: Diagnosis not present

## 2015-07-03 DIAGNOSIS — C569 Malignant neoplasm of unspecified ovary: Secondary | ICD-10-CM

## 2015-07-03 MED ORDER — TBO-FILGRASTIM 300 MCG/0.5ML ~~LOC~~ SOSY
300.0000 ug | PREFILLED_SYRINGE | Freq: Once | SUBCUTANEOUS | Status: AC
  Administered 2015-07-03: 300 ug via SUBCUTANEOUS
  Filled 2015-07-03: qty 0.5

## 2015-07-03 NOTE — Telephone Encounter (Signed)
F/u on nonurgent e-mail from yesterday. Yesterday I lvm for pt to have the injection nurse at [facility] look at her eye today. Pt did not do this. I lvm today to enquire about the condition of her eye. Again urged her to see eye doctor if no improving or worsening.

## 2015-07-04 ENCOUNTER — Other Ambulatory Visit: Payer: Self-pay | Admitting: Oncology

## 2015-07-04 ENCOUNTER — Telehealth: Payer: Self-pay | Admitting: *Deleted

## 2015-07-04 NOTE — Telephone Encounter (Signed)
Spoke with patient regarding message below. Verbalized understanding. States she will see Dr Marko Plume on Monday. Will see ophth if needed

## 2015-07-04 NOTE — Telephone Encounter (Signed)
-----   Message from Gordy Levan, MD sent at 07/04/2015  8:39 AM EST ----- Note left for me from [place], which I do not locate in messages in my views, so summary:  1.MRI from [place] reports nothing of concern in breasts. I will discuss with her at visit on 3-6  2.She needs identification card for the IVC filter placed by IR 04-11-15. RN please ask IR for a card as patient does not have one  3.The eye description would not be from chemo. Warm compresses are fine, seeing her ophth fine, or if not bothering I can look at it at her apt on 3-6  thanks

## 2015-07-07 ENCOUNTER — Other Ambulatory Visit: Payer: Self-pay | Admitting: Oncology

## 2015-07-07 DIAGNOSIS — C561 Malignant neoplasm of right ovary: Secondary | ICD-10-CM

## 2015-07-07 DIAGNOSIS — C563 Malignant neoplasm of bilateral ovaries: Secondary | ICD-10-CM

## 2015-07-07 DIAGNOSIS — C562 Malignant neoplasm of left ovary: Principal | ICD-10-CM

## 2015-07-08 ENCOUNTER — Ambulatory Visit (HOSPITAL_BASED_OUTPATIENT_CLINIC_OR_DEPARTMENT_OTHER): Payer: 59 | Admitting: Oncology

## 2015-07-08 ENCOUNTER — Encounter: Payer: Self-pay | Admitting: Oncology

## 2015-07-08 ENCOUNTER — Telehealth: Payer: Self-pay | Admitting: Oncology

## 2015-07-08 ENCOUNTER — Other Ambulatory Visit (HOSPITAL_BASED_OUTPATIENT_CLINIC_OR_DEPARTMENT_OTHER): Payer: 59

## 2015-07-08 VITALS — BP 100/65 | HR 80 | Temp 98.0°F | Resp 18 | Ht 63.0 in | Wt 140.6 lb

## 2015-07-08 DIAGNOSIS — C562 Malignant neoplasm of left ovary: Secondary | ICD-10-CM

## 2015-07-08 DIAGNOSIS — I2699 Other pulmonary embolism without acute cor pulmonale: Secondary | ICD-10-CM | POA: Diagnosis not present

## 2015-07-08 DIAGNOSIS — R112 Nausea with vomiting, unspecified: Secondary | ICD-10-CM

## 2015-07-08 DIAGNOSIS — R110 Nausea: Secondary | ICD-10-CM

## 2015-07-08 DIAGNOSIS — K5909 Other constipation: Secondary | ICD-10-CM

## 2015-07-08 DIAGNOSIS — D649 Anemia, unspecified: Secondary | ICD-10-CM | POA: Diagnosis not present

## 2015-07-08 DIAGNOSIS — C561 Malignant neoplasm of right ovary: Secondary | ICD-10-CM

## 2015-07-08 DIAGNOSIS — C563 Malignant neoplasm of bilateral ovaries: Secondary | ICD-10-CM

## 2015-07-08 DIAGNOSIS — R922 Inconclusive mammogram: Secondary | ICD-10-CM

## 2015-07-08 DIAGNOSIS — E8940 Asymptomatic postprocedural ovarian failure: Secondary | ICD-10-CM

## 2015-07-08 DIAGNOSIS — Z803 Family history of malignant neoplasm of breast: Secondary | ICD-10-CM

## 2015-07-08 DIAGNOSIS — C569 Malignant neoplasm of unspecified ovary: Secondary | ICD-10-CM

## 2015-07-08 DIAGNOSIS — Z1501 Genetic susceptibility to malignant neoplasm of breast: Secondary | ICD-10-CM

## 2015-07-08 DIAGNOSIS — T451X5A Adverse effect of antineoplastic and immunosuppressive drugs, initial encounter: Secondary | ICD-10-CM

## 2015-07-08 DIAGNOSIS — F418 Other specified anxiety disorders: Secondary | ICD-10-CM

## 2015-07-08 DIAGNOSIS — Z7901 Long term (current) use of anticoagulants: Secondary | ICD-10-CM

## 2015-07-08 DIAGNOSIS — Z1509 Genetic susceptibility to other malignant neoplasm: Secondary | ICD-10-CM

## 2015-07-08 DIAGNOSIS — K219 Gastro-esophageal reflux disease without esophagitis: Secondary | ICD-10-CM

## 2015-07-08 DIAGNOSIS — Z95828 Presence of other vascular implants and grafts: Secondary | ICD-10-CM

## 2015-07-08 DIAGNOSIS — D6481 Anemia due to antineoplastic chemotherapy: Secondary | ICD-10-CM

## 2015-07-08 LAB — COMPREHENSIVE METABOLIC PANEL
ALBUMIN: 3.9 g/dL (ref 3.5–5.0)
ALK PHOS: 77 U/L (ref 40–150)
ALT: 28 U/L (ref 0–55)
ANION GAP: 11 meq/L (ref 3–11)
AST: 18 U/L (ref 5–34)
BUN: 8.6 mg/dL (ref 7.0–26.0)
CALCIUM: 9.6 mg/dL (ref 8.4–10.4)
CO2: 28 mEq/L (ref 22–29)
Chloride: 104 mEq/L (ref 98–109)
Creatinine: 0.8 mg/dL (ref 0.6–1.1)
EGFR: 84 mL/min/{1.73_m2} — AB (ref >90)
GLUCOSE: 79 mg/dL (ref 70–140)
POTASSIUM: 4.1 meq/L (ref 3.5–5.1)
Sodium: 143 mEq/L (ref 136–145)
TOTAL PROTEIN: 7 g/dL (ref 6.4–8.3)

## 2015-07-08 LAB — CBC WITH DIFFERENTIAL/PLATELET
BASO%: 0.4 % (ref 0.0–2.0)
BASOS ABS: 0 10*3/uL (ref 0.0–0.1)
EOS ABS: 0 10*3/uL (ref 0.0–0.5)
EOS%: 0.6 % (ref 0.0–7.0)
HEMATOCRIT: 33.1 % — AB (ref 34.8–46.6)
HEMOGLOBIN: 11.1 g/dL — AB (ref 11.6–15.9)
LYMPH%: 45.1 % (ref 14.0–49.7)
MCH: 33 pg (ref 25.1–34.0)
MCHC: 33.5 g/dL (ref 31.5–36.0)
MCV: 98.5 fL (ref 79.5–101.0)
MONO#: 0.4 10*3/uL (ref 0.1–0.9)
MONO%: 9.8 % (ref 0.0–14.0)
NEUT%: 44.1 % (ref 38.4–76.8)
NEUTROS ABS: 1.7 10*3/uL (ref 1.5–6.5)
PLATELETS: 184 10*3/uL (ref 145–400)
RBC: 3.36 10*6/uL — ABNORMAL LOW (ref 3.70–5.45)
RDW: 13.9 % (ref 11.2–14.5)
WBC: 3.8 10*3/uL — AB (ref 3.9–10.3)
lymph#: 1.7 10*3/uL (ref 0.9–3.3)

## 2015-07-08 NOTE — Telephone Encounter (Signed)
appt made and avs printed. Dr Denman George appt made and Ct to be scheduled by central radiology
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
## 2015-07-09 DIAGNOSIS — E8940 Asymptomatic postprocedural ovarian failure: Secondary | ICD-10-CM | POA: Insufficient documentation

## 2015-07-09 DIAGNOSIS — Z95828 Presence of other vascular implants and grafts: Secondary | ICD-10-CM | POA: Insufficient documentation

## 2015-07-09 LAB — CA 125: CANCER ANTIGEN (CA) 125: 8.1 U/mL (ref 0.0–38.1)

## 2015-07-09 LAB — CANCER ANTIGEN 125 (PARALLEL TESTING): CA 125: 6 U/mL (ref <35)

## 2015-07-09 NOTE — Telephone Encounter (Signed)
Patient sent this My Chart message in follow up of 07-08-15 visit.

## 2015-07-10 MED FILL — FERROCITE TABLET: 324 | 30 days supply | Qty: 30 | Fill #3

## 2015-07-12 ENCOUNTER — Telehealth: Payer: Self-pay

## 2015-07-12 NOTE — Telephone Encounter (Signed)
-----   Message from Gordy Levan, MD sent at 07/09/2015  3:10 PM EST ----- Labs seen and need follow up: fine to let her know results of CA 125 in good low range (she may be able to see these on MyChart)

## 2015-07-12 NOTE — Telephone Encounter (Signed)
Told Ms Kado the results of the Ca-125 as noted below by Dr. Marko Plume.

## 2015-07-18 MED FILL — ESTRADIOL 0.5 MG TABLET: 0.5 | 30 days supply | Qty: 30 | Fill #3

## 2015-07-22 ENCOUNTER — Ambulatory Visit (HOSPITAL_COMMUNITY): Payer: 59

## 2015-07-22 ENCOUNTER — Ambulatory Visit (HOSPITAL_COMMUNITY)
Admission: RE | Admit: 2015-07-22 | Discharge: 2015-07-22 | Disposition: A | Discharge status: Home / Self Care | Payer: 59 | Source: Ambulatory Visit | Attending: Oncology | Admitting: Oncology

## 2015-07-22 ENCOUNTER — Telehealth: Payer: Self-pay | Admitting: Oncology

## 2015-07-22 ENCOUNTER — Other Ambulatory Visit: Payer: Self-pay | Admitting: Oncology

## 2015-07-22 ENCOUNTER — Encounter (HOSPITAL_COMMUNITY): Payer: Self-pay

## 2015-07-22 DIAGNOSIS — C561 Malignant neoplasm of right ovary: Secondary | ICD-10-CM

## 2015-07-22 DIAGNOSIS — C563 Malignant neoplasm of bilateral ovaries: Secondary | ICD-10-CM

## 2015-07-22 DIAGNOSIS — R935 Abnormal findings on diagnostic imaging of other abdominal regions, including retroperitoneum: Secondary | ICD-10-CM | POA: Diagnosis not present

## 2015-07-22 DIAGNOSIS — C562 Malignant neoplasm of left ovary: Secondary | ICD-10-CM | POA: Diagnosis present

## 2015-07-22 MED ORDER — IOHEXOL 300 MG/ML  SOLN
50.0000 mL | Freq: Once | INTRAMUSCULAR | Status: AC | PRN
  Administered 2015-07-22: 50 mL via ORAL

## 2015-07-22 MED ORDER — IOHEXOL 300 MG/ML  SOLN
100.0000 mL | Freq: Once | INTRAMUSCULAR | Status: AC | PRN
  Administered 2015-07-22: 100 mL via INTRAVENOUS

## 2015-07-22 NOTE — Telephone Encounter (Signed)
Medical Oncology  Called patient to let her know that IVC filter probably needs to be removed, not an urgent problem but is in right ovarian vein instead of IVC. She is in agreement with consultation visit with IR, which we will try to set up when that office is open tomorrow. Removal of the filter would be set up after the consultation. Patient continues lovenox.  Told her that CT report otherwise seems good, tho some changes may be post op that can be followed. She is to see Dr Denman George on 07-24-15 and she will review CT then.   Patient understands and is in agreement with IR consultation and removal of the filter.  Godfrey Pick, MD

## 2015-07-22 NOTE — Telephone Encounter (Signed)
Medical Oncology  CT today shows IVC filter in right ovarian vein.   I spoke directly with IR physician, who reviewed CT and prior information, concurs that the filter is in right gonadal vein; he notes this was unusually large vein and imaging after placement seemed correct, however IVC filter would not have migrated.  Seems best to remove filter, recommendation for consultation with IR at their outpatient office [facility] [phone]) prior to scheduling removal. Office hours 0800 - 1700 so unable to set up that appointment this evening.   Godfrey Pick, MD

## 2015-07-23 ENCOUNTER — Telehealth: Payer: Self-pay | Admitting: Oncology

## 2015-07-23 ENCOUNTER — Other Ambulatory Visit: Payer: Self-pay | Admitting: Oncology

## 2015-07-23 DIAGNOSIS — Z95828 Presence of other vascular implants and grafts: Secondary | ICD-10-CM

## 2015-07-23 DIAGNOSIS — I2782 Chronic pulmonary embolism: Secondary | ICD-10-CM

## 2015-07-23 DIAGNOSIS — C569 Malignant neoplasm of unspecified ovary: Secondary | ICD-10-CM

## 2015-07-23 NOTE — Telephone Encounter (Signed)
Medical Oncology  MD spoke directly with [facility] IR office. Order placed for consultation for removal of the IVC filter that is in incorrect location. Requested removal, not replacement. That office to contact patient, who is aware of situation.  Godfrey Pick, MD

## 2015-07-24 ENCOUNTER — Other Ambulatory Visit: Payer: Self-pay | Admitting: Oncology

## 2015-07-24 ENCOUNTER — Ambulatory Visit: Payer: 59 | Attending: Gynecologic Oncology | Admitting: Gynecologic Oncology

## 2015-07-24 ENCOUNTER — Encounter: Payer: Self-pay | Admitting: Gynecologic Oncology

## 2015-07-24 VITALS — BP 100/77 | HR 77 | Temp 98.3°F | Resp 18 | Ht 63.0 in | Wt 140.2 lb

## 2015-07-24 DIAGNOSIS — Z87891 Personal history of nicotine dependence: Secondary | ICD-10-CM | POA: Diagnosis not present

## 2015-07-24 DIAGNOSIS — Z9079 Acquired absence of other genital organ(s): Secondary | ICD-10-CM | POA: Diagnosis present

## 2015-07-24 DIAGNOSIS — Z9221 Personal history of antineoplastic chemotherapy: Secondary | ICD-10-CM | POA: Insufficient documentation

## 2015-07-24 DIAGNOSIS — C561 Malignant neoplasm of right ovary: Secondary | ICD-10-CM

## 2015-07-24 DIAGNOSIS — Z90710 Acquired absence of both cervix and uterus: Secondary | ICD-10-CM | POA: Diagnosis not present

## 2015-07-24 DIAGNOSIS — Z1502 Genetic susceptibility to malignant neoplasm of ovary: Secondary | ICD-10-CM

## 2015-07-24 DIAGNOSIS — Z86711 Personal history of pulmonary embolism: Secondary | ICD-10-CM

## 2015-07-24 DIAGNOSIS — Z7901 Long term (current) use of anticoagulants: Secondary | ICD-10-CM

## 2015-07-24 DIAGNOSIS — C562 Malignant neoplasm of left ovary: Secondary | ICD-10-CM

## 2015-07-24 DIAGNOSIS — Z90722 Acquired absence of ovaries, bilateral: Secondary | ICD-10-CM | POA: Diagnosis not present

## 2015-07-24 DIAGNOSIS — C563 Malignant neoplasm of bilateral ovaries: Secondary | ICD-10-CM

## 2015-07-24 DIAGNOSIS — C569 Malignant neoplasm of unspecified ovary: Secondary | ICD-10-CM | POA: Diagnosis not present

## 2015-07-24 MED ORDER — ENOXAPARIN SODIUM 100 MG/ML ~~LOC~~ SOLN: 95.0000 mg | SUBCUTANEOUS | Status: DC

## 2015-07-24 NOTE — Patient Instructions (Signed)
Call us in June 2017 to make your August follow up appointment with Dr Everitt Amber, we will contact you when the referral is placed to the [facility].  Thank you !!

## 2015-07-24 NOTE — Progress Notes (Signed)
Consult Note: Gyn-Onc  Consult was requested by Dr. Kennon Rounds for the evaluation of Elizabeth Henry 45 y.o. female   CC:  Chief Complaint  Patient presents with  . ovarian cancer follow-up    Assessment/Plan:  Elizabeth Henry  is a 45 y.o.  year old with stage IV ovarian cancer in the setting of acute PE, s/p 3 cycles of neoadjuvant chemotherapy s/p ex lap, TAH, BSO, omentectomy, interval debulking to no macroscopic residual disease on 05/02/15. 3 additional cycles of chemotherapy with carboplatin and paclitaxel completed on 06/24/15.  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  BRCA 1 deleterious mutation positive.  She has had a complete response to therapy.   I had a long discussion with the patient and her husband about prognosis, risk for recurrence and options for treatment in the future. We discussed recommended surveillance of cancer with exams and CA 125 every 3 months. We will alternate these visits with Dr Marko Plume.  Plan to continue Lovenox until she has completed removal of IVC filter as it poses a thrombotic risk. She can discontinue after filter removal.   HPI: Elizabeth Henry is a very pleasant 45 year old G2P2 who was initially seen in consultation at the request of Dr Kennon Rounds for bilateral  Ovarian masses, ascites, elevated CA-125, and abnormal uterine bleeding. The patient began experiencing abdominal bloating and early satiety 6 weeks ago in July 2016. She since the end of August 2016 she has experienced vaginal bleeding every day that is light. She denies pain other than stretching pain in her abdomen. She's had some constipation but no change in the caliber of his stools and no blood in her bowel movements.  She was evaluated by Dr. Kennon Rounds on 01/25/2015. A pelvic and abdominal ultrasound scan were ordered and revealed large volume of ascites in the abdomen, bilateral ovarian masses with the right measuring 24.3 x 13.1 x  25.3 cm with solid and cystic components and blood flow within the solid components. The left ovary measured 14.5 x 12.7 x 14.8 cm and containing solid cystic components with increased blood flow. The endometrial stripe was slightly thickened at 9 mm (cc patient is premenopausal). A CA-125 was drawn on 01/28/2015 and was elevated at 3142 units per milliliter.  A CT chest was performed on 01/31/15 which showed an acute PE and pleural effusions.  She was started on therapeutic lovenox.  She was started on neoadjuvant chemotherapy (due to her acute PE and the concern about discontinuing anticoagulation for surgery in the acute setting).  Prior to starting chemotherapy she underwent paracentesis and thoracentesis (right) both of which confirmed metastatic adencarcinoma consistent with gyn primary.  On 02/12/15 she began neoadjuvant chemotherapy with 3 cycles of paclitaxel and carboplatin.  She underwent BRCA deleterious testing which was positive for a BRCA 1 mutation.  CT on 03/29/15 showed interval resolution of ascites and significant improvement in upper abdominal disease with persistent ovarian cysts.  On 04/16/15 she underwent an ex lap, TAH, BSO, omentectomy and radical tumor debulking to no gross residual disease. She tolerated surgery well.  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  Interval Hx: She went on to complete 3 additional adjuvant chemotherapy cycles with carboplatin and paclitaxel. She tolerated treatment well with no concerns. Last cycle of treatment was 06/24/15.  CT abdo/pelvis and chest on 07/22/15 showed no measurable residual disease. The IVC filter appeared to have migrated  to the ovarian vessel.  Her main ongoing symptom post-treatment is fatigue.  Current Meds:  Outpatient Encounter Prescriptions as of 07/24/2015  Medication Sig  . acetaminophen (TYLENOL) 500 MG tablet Take 1,000 mg by  mouth every 6 (six) hours as needed for moderate pain.   . Cholecalciferol (VITAMIN D) 2000 UNITS tablet Take 4,000 Units by mouth daily.  Marland Kitchen enoxaparin (LOVENOX) 100 MG/ML injection Inject 0.95 mLs (95 mg total) into the skin daily.  Marland Kitchen estradiol (ESTRACE) 0.5 MG tablet Take 1 tablet (0.5 mg total) by mouth daily.  Marland Kitchen FERROCITE 324 MG TABS Take 1 tablet on an empty stomach with OJ.  . hydrOXYzine (ATARAX/VISTARIL) 25 MG tablet Take 50 mg by mouth at bedtime.   Marland Kitchen LINZESS 145 MCG CAPS capsule Take 1 capsule by mouth at bedtime as needed (help use the bathroom.).   Marland Kitchen LORazepam (ATIVAN) 1 MG tablet 1 SL or PO every 6 hours as needed for nausea. May use 1-2 tablets  HS as needed  . montelukast (SINGULAIR) 10 MG tablet Take 10 mg by mouth at bedtime.  . pantoprazole (PROTONIX) 40 MG tablet Take 40 mg by mouth daily.  . sennosides-docusate sodium (SENOKOT-S) 8.6-50 MG tablet Take 2-4 tablets by mouth at bedtime as needed for constipation.   . SUMAtriptan (IMITREX) 100 MG tablet Take 100 mg by mouth as needed for migraine. Reported on 06/17/2015  . triamcinolone cream (KENALOG) 0.1 % For exzema  . venlafaxine (EFFEXOR) 100 MG tablet Take 100 mg by mouth 2 (two) times daily.  Marland Kitchen PREVIDENT 5000 BOOSTER PLUS 1.1 % PSTE FOLLOW DIRECTIONS PROVIDED BY PHYSICIAN  . sodium chloride (OCEAN) 0.65 % SOLN nasal spray Place 1 spray into both nostrils as needed for congestion. (Patient not taking: Reported on 07/24/2015)   No facility-administered encounter medications on file as of 07/24/2015.    Allergy:  Allergies  Allergen Reactions  . Ambien [Zolpidem Tartrate] Swelling  . Compazine [Prochlorperazine Edisylate] Hives  . Tylox [Oxycodone-Acetaminophen] Hives    Social Hx:   Social History   Social History  . Marital Status: Married    Spouse Name: N/A  . Number of Children: 2  . Years of Education: 16   Occupational History  . Self-employed    Social History Main Topics  . Smoking status: Former  Smoker -- 1.00 packs/day for 10 years    Types: Cigarettes    Quit date: 10/18/2002  . Smokeless tobacco: Never Used     Comment: smoked on and off for 10 yrs total - up to 1 ppd at most  . Alcohol Use: No     Comment: very rarely  . Drug Use: No  . Sexual Activity: Not Currently   Other Topics Concern  . Not on file   Social History Narrative   Fun: Go to [employer]; crochet   Denies religious beliefs effecting health care.     Past Surgical Hx:  Past Surgical History  Procedure Laterality Date  . Wisdom tooth extraction    . Abdominal hysterectomy Bilateral 04/16/2015    Procedure: HYSTERECTOMY ABDOMINAL TOTAL, EXPLORATORY LAPAROTOMY, BILATERAL SALPINGO OOPHERECTOMY, OMENTECTOMY, DEBULKING;  Surgeon: Everitt Amber, MD;  Location: [facility];  Service: Gynecology;  Laterality: Bilateral;    Past Medical Hx:  Past Medical History  Diagnosis Date  . Anxiety   . Allergy   . Migraines   . GERD (gastroesophageal reflux disease)   . Cancer ([place])   . Pneumonia     bronchialpneumonia circa 9470  .  Strep throat   . Pulmonary embolism [facility]) October 2016    Past Gynecological History:  Remote hx of abn paps.  No LMP recorded.  Family Hx:  Family History  Problem Relation Age of Onset  . Breast cancer Mother 31  . Healthy Father   . Breast cancer Maternal Grandmother     dx. 49 or younger  . Brain cancer Paternal Grandmother     unspecified tumor type  . HIV Brother   . Leukemia Paternal Uncle   . HIV Brother   . Alzheimer's disease Maternal Grandfather   . Diabetes Maternal Grandfather   . Breast cancer Other   . Cancer Paternal Uncle     unspecified type    Review of Systems:  Constitutional  Feels distended  ENT Normal appearing ears and nares bilaterally Skin/Breast  No rash, sores, jaundice, itching, dryness Cardiovascular  No chest pain, shortness of breath, or edema  Pulmonary  No cough or wheeze.  Gastro Intestinal  No nausea, vomitting, or diarrhoea.   Genito Urinary  No frequency, urgency, dysuria, no vaginal bleeding Musculo Skeletal  No myalgia, arthralgia, joint swelling or pain  Neurologic  No weakness, numbness, change in gait,  Psychology  No depression, anxiety, insomnia.   Vitals:  Blood pressure 100/77, pulse 77, temperature 98.3 F (36.8 C), temperature source Oral, resp. rate 18, height '5\' 3"'  (1.6 m), weight 140 lb 3.2 oz (63.594 kg), SpO2 100 %.  Physical Exam: WD in NAD Neck  Supple NROM, without any enlargements.  Lymph Node Survey No cervical supraclavicular or inguinal adenopathy Cardiovascular  Pulse normal rate, regularity and rhythm. S1 and S2 normal.  Lungs  Clear to auscultation bilateraly, without wheezes/crackles/rhonchi. Good air movement.  Skin  No rash/lesions/breakdown  Psychiatry  Alert and oriented to person, place, and time  Abdomen  Normoactive bowel sounds, abdomen non distended with well healed incision. No masses. Back No CVA tenderness Genito Urinary  Vulva/vagina: Normal external female genitalia.   No lesions. No discharge or bleeding.  Bladder/urethra:  No lesions or masses, well supported bladder  Vagina: cuff healing normally, in tact, no masses  Cervix and uterus: surgically absent  Adnexa: no masses. Rectal  Good tone, no masses no cul de sac nodularity.  Extremities  No bilateral cyanosis, clubbing or edema.   30 minutes of direct face to face counseling time was spent with the patient. This included discussion about prognosis, therapy recommendations.   Donaciano Eva, MD  07/24/2015, 2:47 PM

## 2015-07-25 ENCOUNTER — Other Ambulatory Visit: Payer: Self-pay | Admitting: Oncology

## 2015-07-25 ENCOUNTER — Telehealth: Payer: Self-pay

## 2015-07-25 ENCOUNTER — Telehealth: Payer: Self-pay | Admitting: *Deleted

## 2015-07-25 MED FILL — ENOXAPARIN 100 MG/ML SYR: 100 | 30 days supply | Qty: 30 | Fill #0

## 2015-07-25 NOTE — Telephone Encounter (Addendum)
Attempted to contact the patient to update that her request was denied for an excuse for her upcoming jury duty scheduled on Monday May 01,2017 . The denial was made by Dr Everitt Amber ( GYN/ONC ) and Dr Evlyn Clines ( oncologist ). Patient was informed that their is no medical reason not to attend jury duty as she is not currently receiving chemotherapy .Left a detailed message with denial of request , patient informed that we would be mailing her jury summons back to home address. Our office contact information was given if the patient has additional questions .

## 2015-07-25 NOTE — Telephone Encounter (Signed)
Pt called stating that she needs an [place] before the IVC filter removal. The order was sent to [facility]. [facility] does not take her insurance. What other facility can do the [place]?

## 2015-07-25 NOTE — Telephone Encounter (Signed)
Elizabeth Henry called to tell me she couldn't schedule to be seen at our facility because her [facility] is out of network. Patient states she will f/u with Dr. Marko Plume to request a referral to the hospital.

## 2015-07-26 ENCOUNTER — Other Ambulatory Visit: Payer: Self-pay | Admitting: Oncology

## 2015-07-26 ENCOUNTER — Other Ambulatory Visit: Payer: Self-pay | Admitting: Interventional Radiology

## 2015-07-26 DIAGNOSIS — Z95828 Presence of other vascular implants and grafts: Secondary | ICD-10-CM

## 2015-07-26 NOTE — Telephone Encounter (Signed)
-----   Message from Gordy Levan, MD sent at 07/26/2015  3:20 PM EDT ----- I have spoken with IR and directly with Dr Arne Cleveland. Dr Vernard Gambles is glad to see her as outpatient at hospital, discuss the removal with her and do the procedure all on same day. Radiology will be in touch with patient to schedule  Radiology should tell her when to stop lovenox, likely 24 hours ahead, maybe longer - OK.  She does not need to resume lovenox after filter removed.  So hopefully her insurance will cover it this way-  thanks

## 2015-07-26 NOTE — Telephone Encounter (Signed)
S/w pt per Dr Edwyna Shell attached message. She was appreciative. She will expect phone call from IR.

## 2015-08-01 ENCOUNTER — Telehealth: Payer: Self-pay

## 2015-08-01 NOTE — Telephone Encounter (Signed)
Patient called triage yesterday inquiring if she could go to the [facility] for a few days with her husband. [name] ,APNP reviewed the patient's labs ,and it is "OK" to go ahead with her plans . Patient contacted and updated that it is safe to go to the [facility] , patient states understanding ,denies further questions or concerns at this time.

## 2015-08-02 ENCOUNTER — Encounter: Payer: Self-pay | Admitting: Gynecologic Oncology

## 2015-08-06 ENCOUNTER — Other Ambulatory Visit: Payer: Self-pay

## 2015-08-06 ENCOUNTER — Other Ambulatory Visit: Payer: Self-pay | Admitting: Gynecologic Oncology

## 2015-08-06 DIAGNOSIS — Z1501 Genetic susceptibility to malignant neoplasm of breast: Secondary | ICD-10-CM

## 2015-08-06 DIAGNOSIS — Z1509 Genetic susceptibility to other malignant neoplasm: Principal | ICD-10-CM

## 2015-08-06 DIAGNOSIS — C569 Malignant neoplasm of unspecified ovary: Secondary | ICD-10-CM

## 2015-08-06 MED ORDER — FERROCITE 324 MG PO TABS: 1.0000 | ORAL_TABLET | Freq: Every day | ORAL | Status: DC

## 2015-08-11 ENCOUNTER — Encounter: Payer: Self-pay | Admitting: Oncology

## 2015-08-15 ENCOUNTER — Telehealth: Payer: Self-pay | Admitting: Oncology

## 2015-08-15 NOTE — Telephone Encounter (Signed)
patient sent [facility] email to move appt on 5/8. Per dest nurwes r/s for LL 5/11 at 1pm

## 2015-08-19 ENCOUNTER — Other Ambulatory Visit: Payer: Self-pay | Admitting: Radiology

## 2015-08-19 MED FILL — ESTRADIOL 0.5 MG TABLET: 0.5 | 30 days supply | Qty: 30 | Fill #4

## 2015-08-20 ENCOUNTER — Ambulatory Visit (HOSPITAL_COMMUNITY)
Admission: RE | Admit: 2015-08-20 | Discharge: 2015-08-20 | Disposition: A | Discharge status: Home / Self Care | Payer: 59 | Source: Ambulatory Visit | Attending: Interventional Radiology | Admitting: Interventional Radiology

## 2015-08-20 ENCOUNTER — Encounter (HOSPITAL_COMMUNITY): Payer: Self-pay

## 2015-08-20 DIAGNOSIS — G43909 Migraine, unspecified, not intractable, without status migrainosus: Secondary | ICD-10-CM | POA: Diagnosis not present

## 2015-08-20 DIAGNOSIS — T82528A Displacement of other cardiac and vascular devices and implants, initial encounter: Secondary | ICD-10-CM | POA: Diagnosis present

## 2015-08-20 DIAGNOSIS — Z8543 Personal history of malignant neoplasm of ovary: Secondary | ICD-10-CM | POA: Insufficient documentation

## 2015-08-20 DIAGNOSIS — Z87891 Personal history of nicotine dependence: Secondary | ICD-10-CM | POA: Diagnosis not present

## 2015-08-20 DIAGNOSIS — T82525A Displacement of umbrella device, initial encounter: Secondary | ICD-10-CM | POA: Diagnosis not present

## 2015-08-20 DIAGNOSIS — Z86711 Personal history of pulmonary embolism: Secondary | ICD-10-CM | POA: Diagnosis not present

## 2015-08-20 DIAGNOSIS — I10 Essential (primary) hypertension: Secondary | ICD-10-CM | POA: Insufficient documentation

## 2015-08-20 DIAGNOSIS — Z7902 Long term (current) use of antithrombotics/antiplatelets: Secondary | ICD-10-CM | POA: Insufficient documentation

## 2015-08-20 DIAGNOSIS — F419 Anxiety disorder, unspecified: Secondary | ICD-10-CM | POA: Insufficient documentation

## 2015-08-20 DIAGNOSIS — K219 Gastro-esophageal reflux disease without esophagitis: Secondary | ICD-10-CM | POA: Insufficient documentation

## 2015-08-20 DIAGNOSIS — Z95828 Presence of other vascular implants and grafts: Secondary | ICD-10-CM

## 2015-08-20 LAB — BASIC METABOLIC PANEL
ANION GAP: 10 (ref 5–15)
BUN: 16 mg/dL (ref 6–20)
CHLORIDE: 106 mmol/L (ref 101–111)
CO2: 25 mmol/L (ref 22–32)
Calcium: 9.4 mg/dL (ref 8.9–10.3)
Creatinine, Ser: 0.89 mg/dL (ref 0.44–1.00)
GFR calc Af Amer: >60 mL/min (ref >60)
GLUCOSE: 90 mg/dL (ref 65–99)
POTASSIUM: 4 mmol/L (ref 3.5–5.1)
Sodium: 141 mmol/L (ref 135–145)

## 2015-08-20 LAB — CBC WITH DIFFERENTIAL/PLATELET
Basophils Absolute: 0 10*3/uL (ref 0.0–0.1)
Basophils Relative: 1 %
EOS PCT: 4 %
Eosinophils Absolute: 0.2 10*3/uL (ref 0.0–0.7)
HEMATOCRIT: 36.3 % (ref 36.0–46.0)
Hemoglobin: 12.4 g/dL (ref 12.0–15.0)
LYMPHS ABS: 2.1 10*3/uL (ref 0.7–4.0)
LYMPHS PCT: 47 %
MCH: 32.9 pg (ref 26.0–34.0)
MCHC: 34.2 g/dL (ref 30.0–36.0)
MCV: 96.3 fL (ref 78.0–100.0)
Monocytes Absolute: 0.3 10*3/uL (ref 0.1–1.0)
Monocytes Relative: 8 %
NEUTROS ABS: 1.7 10*3/uL (ref 1.7–7.7)
Neutrophils Relative %: 40 %
PLATELETS: 229 10*3/uL (ref 150–400)
RBC: 3.77 MIL/uL — ABNORMAL LOW (ref 3.87–5.11)
RDW: 13.3 % (ref 11.5–15.5)
WBC: 4.3 10*3/uL (ref 4.0–10.5)

## 2015-08-20 LAB — PROTIME-INR
INR: 1.08 (ref 0.00–1.49)
Prothrombin Time: 13.7 seconds (ref 11.6–15.2)

## 2015-08-20 MED ORDER — FENTANYL CITRATE (PF) 100 MCG/2ML IJ SOLN
INTRAMUSCULAR | Status: AC
  Filled 2015-08-20: qty 2

## 2015-08-20 MED ORDER — FENTANYL CITRATE (PF) 100 MCG/2ML IJ SOLN
INTRAMUSCULAR | Status: AC | PRN
  Administered 2015-08-20 (×2): 50 ug via INTRAVENOUS

## 2015-08-20 MED ORDER — HYDROCODONE-ACETAMINOPHEN 5-325 MG PO TABS: 1.0000 | ORAL_TABLET | ORAL | Status: DC | PRN

## 2015-08-20 MED ORDER — IOPAMIDOL (ISOVUE-300) INJECTION 61%
100.0000 mL | Freq: Once | INTRAVENOUS | Status: AC | PRN
  Administered 2015-08-20: 25 mL via INTRAVENOUS

## 2015-08-20 MED ORDER — SODIUM CHLORIDE 0.9 % IV SOLN
INTRAVENOUS | Status: DC
  Administered 2015-08-20: 09:00:00 via INTRAVENOUS

## 2015-08-20 MED ORDER — MIDAZOLAM HCL 2 MG/2ML IJ SOLN
INTRAMUSCULAR | Status: AC | PRN
  Administered 2015-08-20 (×2): 1 mg via INTRAVENOUS

## 2015-08-20 MED ORDER — LIDOCAINE HCL 1 % IJ SOLN
INTRAMUSCULAR | Status: AC
  Filled 2015-08-20: qty 20

## 2015-08-20 MED ORDER — MIDAZOLAM HCL 2 MG/2ML IJ SOLN
INTRAMUSCULAR | Status: AC
  Filled 2015-08-20: qty 4

## 2015-08-20 NOTE — Procedures (Signed)
IVCgram: negative IVC filter removed No complication No blood loss. See complete dictation in [facility]

## 2015-08-20 NOTE — Discharge Instructions (Signed)
Incision Care °An incision is when a surgeon cuts into your body. After surgery, the incision needs to be cared for properly to prevent infection.  °HOW TO CARE FOR YOUR INCISION °· Take medicines only as directed by your health care provider. °· There are many different ways to close and cover an incision, including stitches, skin glue, and adhesive strips. Follow your health care provider's instructions on: °¨ Incision care. °¨ Bandage (dressing) changes and removal. °¨ Incision closure removal. °· Do not take baths, swim, or use a hot tub until your health care provider approves. You may shower as directed by your health care provider. °· Resume your normal diet and activities as directed. °· Use anti-itch medicine (such as an antihistamine) as directed by your health care provider. The incision may itch while it is healing. Do not pick or scratch at the incision. °· Drink enough fluid to keep your urine clear or pale yellow. °SEEK MEDICAL CARE IF:  °· You have drainage, redness, swelling, or pain at your incision site. °· You have muscle aches, chills, or a general ill feeling. °· You notice a bad smell coming from the incision or dressing. °· Your incision edges separate after the sutures, staples, or skin adhesive strips have been removed. °· You have persistent nausea or vomiting. °· You have a fever. °· You are dizzy. °SEEK IMMEDIATE MEDICAL CARE IF:  °· You have a rash. °· You faint. °· You have difficulty breathing. °MAKE SURE YOU:  °· Understand these instructions. °· Will watch your condition. °· Will get help right away if you are not doing well or get worse. °  °This information is not intended to replace advice given to you by your health care provider. Make sure you discuss any questions you have with your health care provider. °  °Document Released: 11/07/2004 Document Revised: 05/11/2014 Document Reviewed: 06/14/2013 °Elsevier Interactive Patient Education ©2016 [employer] ° ° °Moderate  Conscious Sedation, Adult, Care After °Refer to this sheet in the next few weeks. These instructions provide you with information on caring for yourself after your procedure. Your health care provider may also give you more specific instructions. Your treatment has been planned according to current medical practices, but problems sometimes occur. Call your health care provider if you have any problems or questions after your procedure. °WHAT TO EXPECT AFTER THE PROCEDURE  °After your procedure: °· You may feel sleepy, clumsy, and have poor balance for several hours. °· Vomiting may occur if you eat too soon after the procedure. °HOME CARE INSTRUCTIONS °· Do not participate in any activities where you could become injured for at least 24 hours. Do not: °¨ Drive. °¨ Swim. °¨ Ride a bicycle. °¨ Operate heavy machinery. °¨ Cook. °¨ Use power tools. °¨ Climb ladders. °¨ Work from a high place. °· Do not make important decisions or sign legal documents until you are improved. °· If you vomit, drink water, juice, or soup when you can drink without vomiting. Make sure you have little or no nausea before eating solid foods. °· Only take over-the-counter or prescription medicines for pain, discomfort, or fever as directed by your health care provider. °· Make sure you and your family fully understand everything about the medicines given to you, including what side effects may occur. °· You should not drink alcohol, take sleeping pills, or take medicines that cause drowsiness for at least 24 hours. °· If you smoke, do not smoke without supervision. °· If you are feeling   better, you may resume normal activities 24 hours after you were sedated. °· Keep all appointments with your health care provider. °SEEK MEDICAL CARE IF: °· Your skin is pale or bluish in color. °· You continue to feel nauseous or vomit. °· Your pain is getting worse and is not helped by medicine. °· You have bleeding or swelling. °· You are still sleepy or  feeling clumsy after 24 hours. °SEEK IMMEDIATE MEDICAL CARE IF: °· You develop a rash. °· You have difficulty breathing. °· You develop any type of allergic problem. °· You have a fever. °MAKE SURE YOU: °· Understand these instructions. °· Will watch your condition. °· Will get help right away if you are not doing well or get worse. °  °This information is not intended to replace advice given to you by your health care provider. Make sure you discuss any questions you have with your health care provider. °  °Document Released: 02/08/2013 Document Revised: 05/11/2014 Document Reviewed: 02/08/2013 °Elsevier Interactive Patient Education ©2016 [employer]. ° °

## 2015-08-20 NOTE — Sedation Documentation (Signed)
Patient denies pain and is resting comfortably.  

## 2015-08-20 NOTE — H&P (Signed)
Chief Complaint: malpositioned IVC filter  Referring Physician: Dr. Evlyn Clines  Supervising Physician: Arne Cleveland  HPI: Elizabeth Henry is an 45 y.o. female with a history of ovarian cancer who had a PE in 2016.  She had an IVC filter placed and has been on lovenox injections since then.  Her last injection was last night.  She had a follow up CT scan for surveillance and had an incidental find of a malpositioned IVC filter in her ovarian vein.  She presents today for retrieval.  She has been feeling well lately otherwise, except fatigue.  Past Medical History:  Past Medical History  Diagnosis Date  . Anxiety   . Allergy   . Migraines   . GERD (gastroesophageal reflux disease)   . Cancer ([place])   . Pneumonia     bronchialpneumonia circa [id number]  . Strep throat   . Pulmonary embolism [facility]) October 2016    Past Surgical History:  Past Surgical History  Procedure Laterality Date  . Wisdom tooth extraction    . Abdominal hysterectomy Bilateral 04/16/2015    Procedure: HYSTERECTOMY ABDOMINAL TOTAL, EXPLORATORY LAPAROTOMY, BILATERAL SALPINGO OOPHERECTOMY, OMENTECTOMY, DEBULKING;  Surgeon: Everitt Amber, MD;  Location: [facility];  Service: Gynecology;  Laterality: Bilateral;    Family History:  Family History  Problem Relation Age of Onset  . Breast cancer Mother 16  . Healthy Father   . Breast cancer Maternal Grandmother     dx. 64 or younger  . Brain cancer Paternal Grandmother     unspecified tumor type  . HIV Brother   . Leukemia Paternal Uncle   . HIV Brother   . Alzheimer's disease Maternal Grandfather   . Diabetes Maternal Grandfather   . Breast cancer Other   . Cancer Paternal Uncle     unspecified type    Social History:  reports that she quit smoking about 12 years ago. Her smoking use included Cigarettes. She has a 10 pack-year smoking history. She has never used smokeless tobacco. She reports that she does not drink alcohol or use illicit  drugs.  Allergies:  Allergies  Allergen Reactions  . Ambien [Zolpidem Tartrate] Swelling  . Compazine [Prochlorperazine Edisylate] Hives  . Tylox [Oxycodone-Acetaminophen] Hives    Medications:   Medication List    ASK your doctor about these medications        acetaminophen 500 MG tablet  Commonly known as:  TYLENOL  Take 1,000 mg by mouth every 6 (six) hours as needed for moderate pain.     enoxaparin 100 MG/ML injection  Commonly known as:  LOVENOX  Inject 0.95 mLs (95 mg total) into the skin daily.     estradiol 0.5 MG tablet  Commonly known as:  ESTRACE  Take 1 tablet (0.5 mg total) by mouth daily.     FERROCITE 324 (106 Fe) MG Tabs tablet  Generic drug:  Ferrous Fumarate  Take 1 tablet (106 mg of iron total) by mouth daily. on an empty stomach with OJ.     hydrOXYzine 25 MG tablet  Commonly known as:  ATARAX/VISTARIL  Take 50 mg by mouth at bedtime.     LINZESS 145 MCG Caps capsule  Generic drug:  linaclotide  Take 1 capsule by mouth at bedtime as needed (help use the bathroom.).     LORazepam 1 MG tablet  Commonly known as:  ATIVAN  1 SL or PO every 6 hours as needed for nausea. May use 1-2 tablets  HS as needed  montelukast 10 MG tablet  Commonly known as:  SINGULAIR  Take 10 mg by mouth at bedtime.     pantoprazole 40 MG tablet  Commonly known as:  PROTONIX  Take 40 mg by mouth daily.     prenatal multivitamin Tabs tablet  Take 1 tablet by mouth daily at 12 noon.     PREVIDENT 5000 BOOSTER PLUS 1.1 % Pste  Generic drug:  Sodium Fluoride  FOLLOW DIRECTIONS PROVIDED BY PHYSICIAN     sennosides-docusate sodium 8.6-50 MG tablet  Commonly known as:  SENOKOT-S  Take 2-4 tablets by mouth at bedtime as needed for constipation.     sodium chloride 0.65 % Soln nasal spray  Commonly known as:  OCEAN  Place 1 spray into both nostrils as needed for congestion.     SUMAtriptan 100 MG tablet  Commonly known as:  IMITREX  Take 100 mg by mouth as needed  for migraine. Reported on 06/17/2015     triamcinolone cream 0.1 %  Commonly known as:  KENALOG  For exzema     venlafaxine 100 MG tablet  Commonly known as:  EFFEXOR  Take 100 mg by mouth 2 (two) times daily.     Vitamin D 2000 units tablet  Take 4,000 Units by mouth daily.        Please HPI for pertinent positives, otherwise complete 10 system ROS negative.  Mallampati Score: MD Evaluation Airway: WNL Heart: WNL Abdomen: WNL Chest/ Lungs: WNL ASA  Classification: 3 Mallampati/Airway Score: One  Physical Exam: BP 98/67 mmHg  Pulse 70  Temp(Src) 98.1 F (36.7 C) (Oral)  Resp 16  SpO2 97%  LMP 01/03/2015 There is no weight on file to calculate BMI. General: pleasant, WD, WN white female who is laying in bed in NAD HEENT: head is normocephalic, atraumatic.  Sclera are noninjected.  PERRL.  Ears and nose without any masses or lesions.  Mouth is pink and moist Heart: regular, rate, and rhythm.  Normal s1,s2. No obvious murmurs, gallops, or rubs noted.  Palpable radial and pedal pulses bilaterally Lungs: CTAB, no wheezes, rhonchi, or rales noted.  Respiratory effort nonlabored Abd: soft, NT, ND, +BS, no masses, hernias, or organomegaly MS: all 4 extremities are symmetrical with no cyanosis, clubbing, or edema. Psych: A&Ox3 with an appropriate affect.   Labs: Results for orders placed or performed during the hospital encounter of 08/20/15 (from the past 48 hour(s))  CBC with Differential/Platelet     Status: Abnormal   Collection Time: 08/20/15  8:50 AM  Result Value Ref Range   WBC 4.3 4.0 - 10.5 K/uL   RBC 3.77 (L) 3.87 - 5.11 MIL/uL   Hemoglobin 12.4 12.0 - 15.0 g/dL   HCT 36.3 36.0 - 46.0 %   MCV 96.3 78.0 - 100.0 fL   MCH 32.9 26.0 - 34.0 pg   MCHC 34.2 30.0 - 36.0 g/dL   RDW 13.3 11.5 - 15.5 %   Platelets 229 150 - 400 K/uL   Neutrophils Relative % 40 %   Neutro Abs 1.7 1.7 - 7.7 K/uL   Lymphocytes Relative 47 %   Lymphs Abs 2.1 0.7 - 4.0 K/uL   Monocytes  Relative 8 %   Monocytes Absolute 0.3 0.1 - 1.0 K/uL   Eosinophils Relative 4 %   Eosinophils Absolute 0.2 0.0 - 0.7 K/uL   Basophils Relative 1 %   Basophils Absolute 0.0 0.0 - 0.1 K/uL    Imaging: No results found.  Assessment/Plan 1. Malpositioned IVC filter -  we will plan to remove her IVC filter today as it is malpositioned and no longer felt to be needed. -her labs and vitals have been reviewed. -Risks and Benefits discussed with the patient including, but not limited to bleeding, infection, contrast induced renal failure, injury to vessels. All of the patient's questions were answered, patient is agreeable to proceed. Consent signed and in chart.    Thank you for this interesting consult.  I greatly enjoyed meeting ALEKSIS THRUN and look forward to participating in their care.  A copy of this report was sent to the requesting provider on this date.  Electronically Signed: Henreitta Cea 08/20/2015, 9:04 AM   I spent a total of  30 Minutes   in face to face in clinical consultation, greater than 50% of which was counseling/coordinating care for malpositioned IVC filter

## 2015-09-09 ENCOUNTER — Ambulatory Visit: Payer: 59 | Admitting: Oncology

## 2015-09-09 ENCOUNTER — Other Ambulatory Visit: Payer: 59

## 2015-09-11 ENCOUNTER — Other Ambulatory Visit: Payer: Self-pay | Admitting: Oncology

## 2015-09-11 DIAGNOSIS — C563 Malignant neoplasm of bilateral ovaries: Secondary | ICD-10-CM

## 2015-09-11 DIAGNOSIS — C562 Malignant neoplasm of left ovary: Principal | ICD-10-CM

## 2015-09-11 DIAGNOSIS — C561 Malignant neoplasm of right ovary: Secondary | ICD-10-CM

## 2015-09-12 ENCOUNTER — Ambulatory Visit: Payer: 59 | Admitting: Oncology

## 2015-09-12 ENCOUNTER — Other Ambulatory Visit: Payer: Self-pay | Admitting: *Deleted

## 2015-09-12 ENCOUNTER — Ambulatory Visit (HOSPITAL_BASED_OUTPATIENT_CLINIC_OR_DEPARTMENT_OTHER): Payer: 59 | Admitting: Oncology

## 2015-09-12 ENCOUNTER — Other Ambulatory Visit: Payer: 59

## 2015-09-12 ENCOUNTER — Encounter: Payer: Self-pay | Admitting: Oncology

## 2015-09-12 ENCOUNTER — Encounter: Payer: Self-pay | Admitting: *Deleted

## 2015-09-12 ENCOUNTER — Telehealth: Payer: Self-pay | Admitting: Oncology

## 2015-09-12 ENCOUNTER — Other Ambulatory Visit (HOSPITAL_BASED_OUTPATIENT_CLINIC_OR_DEPARTMENT_OTHER): Payer: 59

## 2015-09-12 ENCOUNTER — Encounter: Payer: 59 | Admitting: *Deleted

## 2015-09-12 VITALS — BP 95/69 | HR 77 | Temp 98.0°F | Resp 17 | Wt 145.3 lb

## 2015-09-12 DIAGNOSIS — C563 Malignant neoplasm of bilateral ovaries: Secondary | ICD-10-CM

## 2015-09-12 DIAGNOSIS — Z1501 Genetic susceptibility to malignant neoplasm of breast: Secondary | ICD-10-CM

## 2015-09-12 DIAGNOSIS — K219 Gastro-esophageal reflux disease without esophagitis: Secondary | ICD-10-CM

## 2015-09-12 DIAGNOSIS — R9230 Dense breasts, unspecified: Secondary | ICD-10-CM

## 2015-09-12 DIAGNOSIS — C562 Malignant neoplasm of left ovary: Secondary | ICD-10-CM

## 2015-09-12 DIAGNOSIS — K5909 Other constipation: Secondary | ICD-10-CM

## 2015-09-12 DIAGNOSIS — Z803 Family history of malignant neoplasm of breast: Secondary | ICD-10-CM

## 2015-09-12 DIAGNOSIS — F418 Other specified anxiety disorders: Secondary | ICD-10-CM

## 2015-09-12 DIAGNOSIS — C569 Malignant neoplasm of unspecified ovary: Secondary | ICD-10-CM

## 2015-09-12 DIAGNOSIS — E8940 Asymptomatic postprocedural ovarian failure: Secondary | ICD-10-CM

## 2015-09-12 DIAGNOSIS — C561 Malignant neoplasm of right ovary: Secondary | ICD-10-CM | POA: Diagnosis not present

## 2015-09-12 DIAGNOSIS — R922 Inconclusive mammogram: Secondary | ICD-10-CM

## 2015-09-12 DIAGNOSIS — D649 Anemia, unspecified: Secondary | ICD-10-CM | POA: Diagnosis not present

## 2015-09-12 DIAGNOSIS — Z1589 Genetic susceptibility to other disease: Secondary | ICD-10-CM

## 2015-09-12 DIAGNOSIS — I2699 Other pulmonary embolism without acute cor pulmonale: Secondary | ICD-10-CM

## 2015-09-12 DIAGNOSIS — Z809 Family history of malignant neoplasm, unspecified: Secondary | ICD-10-CM

## 2015-09-12 DIAGNOSIS — Z1509 Genetic susceptibility to other malignant neoplasm: Secondary | ICD-10-CM

## 2015-09-12 DIAGNOSIS — Z86711 Personal history of pulmonary embolism: Secondary | ICD-10-CM

## 2015-09-12 LAB — COMPREHENSIVE METABOLIC PANEL
ALBUMIN: 3.9 g/dL (ref 3.5–5.0)
ALK PHOS: 58 U/L (ref 40–150)
ALT: 19 U/L (ref 0–55)
AST: 14 U/L (ref 5–34)
Anion Gap: 6 mEq/L (ref 3–11)
BUN: 12.4 mg/dL (ref 7.0–26.0)
CO2: 30 mEq/L — ABNORMAL HIGH (ref 22–29)
Calcium: 9.3 mg/dL (ref 8.4–10.4)
Chloride: 106 mEq/L (ref 98–109)
Creatinine: 0.9 mg/dL (ref 0.6–1.1)
EGFR: 74 mL/min/{1.73_m2} — ABNORMAL LOW (ref >90)
GLUCOSE: 98 mg/dL (ref 70–140)
Potassium: 3.9 mEq/L (ref 3.5–5.1)
SODIUM: 142 meq/L (ref 136–145)
TOTAL PROTEIN: 6.8 g/dL (ref 6.4–8.3)

## 2015-09-12 LAB — CBC WITH DIFFERENTIAL/PLATELET
BASO%: 0.2 % (ref 0.0–2.0)
Basophils Absolute: 0 10*3/uL (ref 0.0–0.1)
EOS%: 1.3 % (ref 0.0–7.0)
Eosinophils Absolute: 0.1 10*3/uL (ref 0.0–0.5)
HCT: 35.4 % (ref 34.8–46.6)
HEMOGLOBIN: 12 g/dL (ref 11.6–15.9)
LYMPH%: 36.2 % (ref 14.0–49.7)
MCH: 32.6 pg (ref 25.1–34.0)
MCHC: 33.9 g/dL (ref 31.5–36.0)
MCV: 96.2 fL (ref 79.5–101.0)
MONO#: 0.3 10*3/uL (ref 0.1–0.9)
MONO%: 6.5 % (ref 0.0–14.0)
NEUT%: 55.8 % (ref 38.4–76.8)
NEUTROS ABS: 2.5 10*3/uL (ref 1.5–6.5)
Platelets: 171 10*3/uL (ref 145–400)
RBC: 3.68 10*6/uL — ABNORMAL LOW (ref 3.70–5.45)
RDW: 12.3 % (ref 11.2–14.5)
WBC: 4.5 10*3/uL (ref 3.9–10.3)
lymph#: 1.6 10*3/uL (ref 0.9–3.3)

## 2015-09-12 NOTE — Telephone Encounter (Signed)
appt made and avs printed °
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
## 2015-09-13 ENCOUNTER — Telehealth: Payer: Self-pay

## 2015-09-13 ENCOUNTER — Telehealth: Payer: Self-pay | Admitting: *Deleted

## 2015-09-13 ENCOUNTER — Telehealth: Payer: Self-pay | Admitting: Oncology

## 2015-09-13 LAB — CA 125: Cancer Antigen (CA) 125: 5.9 U/mL (ref 0.0–38.1)

## 2015-09-13 LAB — CANCER ANTIGEN 125 (PARALLEL TESTING): CA 125: 4 U/mL (ref <35)

## 2015-09-13 NOTE — Telephone Encounter (Signed)
-----   Message from Gordy Levan, MD sent at 09/13/2015  8:21 AM EDT ----- Labs seen and need follow up: please let her know marker is in good low range at 4

## 2015-09-13 NOTE — Telephone Encounter (Signed)
appt with Dr. Donne Hazel on May 23 3:10 pm at [place]

## 2015-09-13 NOTE — Telephone Encounter (Signed)
Elizabeth Henry from Dr Cristal Generous office called back to say that they are not contracted with [facility] and the pat would be considered self pay and the visit amount due at time of service is 166.60. Elizabeth Henry has canceled appt per this information

## 2015-09-13 NOTE — Telephone Encounter (Signed)
Told ms. Schriver the results of the CA-123 as noted below by Dr. Marko Plume.

## 2015-09-13 NOTE — Telephone Encounter (Signed)
appt made per 5/11 pof pt aware

## 2015-09-14 DIAGNOSIS — Z86711 Personal history of pulmonary embolism: Secondary | ICD-10-CM | POA: Insufficient documentation

## 2015-09-14 DIAGNOSIS — R922 Inconclusive mammogram: Secondary | ICD-10-CM | POA: Insufficient documentation

## 2015-09-16 MED FILL — ESTRADIOL 0.5 MG TABLET: 0.5 | 30 days supply | Qty: 30 | Fill #5

## 2015-09-17 ENCOUNTER — Telehealth: Payer: Self-pay

## 2015-09-17 NOTE — Telephone Encounter (Signed)
Patient Demographics     Patient Name Sex DOB SSN Address Phone    Elizabeth Henry, Elizabeth Henry Female 01-25-1971 [id number]  [place] [address] [phone] (Home)      FW: referral to Dr Donne Hazel  Received: 4 days ago    Gordy Levan, MD  [facility]: Vonte M Hedgebeth; Baruch Merl, RN; Gordy Levan, MD           [facility] managed care: Can you please get me a list of which general surgeons are covered by her insurance [place]/ [place]/[facility]/ [facility]   Thank you  Lennis       Previous Messages     ----- Message -----   From: Gean Maidens   Sent: 09/13/2015  4:44 PM    To: Gordy Levan, MD, Gean Maidens  Subject: referral to Dr Donne Hazel             I got a call from Dr Cristal Generous office after I scheduled the patients appt to say that their office is not contracted with [facility] the patient insurance. Santiago Glad advised me that the patient would be self pay and at the time of visit would have to pay 166.60 and that the appt that was set for 5/23 has been cxled. Please advise

## 2015-09-17 NOTE — Telephone Encounter (Signed)
LM for Elizabeth Henry stating that our managed care department suggests that she call her insurance company to see which general surgeons are covered by her insurance at the institutions noted below as well as cities as they do not have access to a list of general  surgeons in  [employer].

## 2015-09-20 NOTE — Telephone Encounter (Signed)
Patient Demographics     Patient Name Sex DOB SSN Address Phone    Cherrise, Shedlock Female [birth year] [id number]  [place] [address] [phone] (Home)      Message  Received: Norman Herrlich, MD  Baruch Merl, RN           Please let patient know that we have found out that her [facility] insurance does not cover [facility].   Our breast RN navigators suggest Dr Angelina Ok, breast surgeon at [facility]  I have shared patients with Dr Elisha Headland before and think Mrs Pinera would be pleased with her.   I spoke with [facility], who told me that they do file with [facility], so likely ok. Would be best for patient to call her insurance contact # to confirm, then we can make the referral if she wants, for consultation visit.   If patient has other names, fine to let me know. Our office printed off a long list of [place] approved MDs, but I have not found other surgeons that are correct on what I read so far  I have not tried to find a Psychiatric nurse for consult there yet, may be best to let Dr Elisha Headland make that recommendation if she sees Mrs Nowlen.    thanks

## 2015-09-25 NOTE — Telephone Encounter (Signed)
LM again with Ms. Flammia as this office has not heard back from her as to surgeon's names that she would want to be referred to for prohylactic mastectomies. Stated information regarding Dr. Fr[facility]att [facility].

## 2015-10-02 ENCOUNTER — Encounter: Payer: Self-pay | Admitting: Oncology

## 2015-10-02 NOTE — Progress Notes (Signed)
ADDENDUM to office note 09-12-15  At time of visit 09-12-15, patient has GOG performance status of 0 and life expectancy > 1 year.  Godfrey Pick, MD

## 2015-10-07 ENCOUNTER — Telehealth: Payer: Self-pay

## 2015-10-07 NOTE — Telephone Encounter (Signed)
Called patient regarding research labs and visit for 10/08/15 and she stated that she had called and left a message for scheduling wishing to cancel the appointments as she does not want to participate. I advised her that Tyrell Antonio is out today and that I would have Jenny Reichmann call her tomorrow 10/08/15.

## 2015-10-08 ENCOUNTER — Encounter: Payer: 59 | Admitting: *Deleted

## 2015-10-08 ENCOUNTER — Other Ambulatory Visit: Payer: 59

## 2015-10-08 NOTE — Telephone Encounter (Signed)
Reached Elizabeth Henry today.  She stated that sje had not received VM messages from this nurse. Reviewed information noted below about referral to [facility] surgeon and not being in network. [name] concerned with one car that [place] would not be convenient for her. She will contact the [facility] representative that at assists her with providers and insurance coverage and see if there is the ability to have Geographical information systems officer as medically necessary.  If this is possible she will inquire as to what documentation is needed from Dr. Marko Plume. She will call back once she has this information.  Pt  Also  stated that she is not going to participate in the GOG 0225 diet and exercise study. She wants to get out of the house.  She wants to be held accountable for exercise.  She is looking into the [facility] program and Yoga at the cancer center. If Dr. Marko Plume feels that she is misunderstanding  The program, she is open to review.  [name] also did not know if the 11-11-15 appointment is necessary with Dr. Marko Plume as this may have been for follow up for study.   She has appointment with Dr. Denman George 12-23-15.

## 2015-10-09 ENCOUNTER — Encounter: Payer: Self-pay | Admitting: *Deleted

## 2015-10-09 DIAGNOSIS — C569 Malignant neoplasm of unspecified ovary: Secondary | ICD-10-CM

## 2015-10-09 NOTE — Progress Notes (Signed)
10/09/2015 Per telephone discussion with scheduler on 10/07/2015, patient has decided not to participate in the GOG 0225 study. Cindy S. Brigitte Pulse BSN, RN, CCRP 10/09/2015 1:54 PM

## 2015-10-14 MED FILL — ESTRADIOL 0.5 MG TABLET: 0.5 | 30 days supply | Qty: 30 | Fill #6

## 2015-10-21 ENCOUNTER — Other Ambulatory Visit: Payer: Self-pay | Admitting: Gynecologic Oncology

## 2015-10-21 ENCOUNTER — Encounter: Payer: Self-pay | Admitting: Oncology

## 2015-10-21 ENCOUNTER — Telehealth: Payer: Self-pay

## 2015-10-21 DIAGNOSIS — C569 Malignant neoplasm of unspecified ovary: Secondary | ICD-10-CM

## 2015-10-21 MED ORDER — ESTRADIOL 0.5 MG PO TABS: 0.5000 mg | ORAL_TABLET | Freq: Every day | ORAL | Status: DC

## 2015-10-21 NOTE — Telephone Encounter (Signed)
Spoke with [employer] in the Research Dept.  She said that Elizabeth Henry research nurse said that there is still time to enroll into the study. Told Elizabeth Henry this and that Elizabeth Henry will contact her tomorrow 10-22-15 afternoon as she has a meeting in the morning. Pt appreciated the call.

## 2015-10-21 NOTE — Telephone Encounter (Signed)
Ms Elizabeth Henry [facility] stating that she would like to enroll into the GOG study is she still has time for enrolling.

## 2015-10-21 NOTE — Progress Notes (Signed)
Reordered medication per pt request and sent to [facility] in [place].

## 2015-10-22 ENCOUNTER — Encounter: Payer: Self-pay | Admitting: *Deleted

## 2015-10-22 NOTE — Telephone Encounter (Signed)
Information placed on Dr. Mariana Kaufman desk to review and make referral upon her her return to the office on 10-28-15.

## 2015-10-27 ENCOUNTER — Other Ambulatory Visit: Payer: Self-pay | Admitting: Oncology

## 2015-10-27 ENCOUNTER — Encounter: Payer: Self-pay | Admitting: Oncology

## 2015-10-27 DIAGNOSIS — Z1501 Genetic susceptibility to malignant neoplasm of breast: Secondary | ICD-10-CM

## 2015-10-27 DIAGNOSIS — Z1509 Genetic susceptibility to other malignant neoplasm: Principal | ICD-10-CM

## 2015-10-27 NOTE — Progress Notes (Signed)
Medical Oncology  Per patient, her insurance confirmed that Dr Fransisca Connors McNatt at [facility] is covered. Referral placed into EMR now, RN to let patient know.  Godfrey Pick, MD

## 2015-10-28 ENCOUNTER — Telehealth: Payer: Self-pay | Admitting: Oncology

## 2015-10-28 ENCOUNTER — Telehealth: Payer: Self-pay | Admitting: *Deleted

## 2015-10-28 NOTE — Telephone Encounter (Signed)
-----   Message from Gordy Levan, MD sent at 10/27/2015  1:56 PM EDT ----- Patient confirmed with her insurance that they will cover consultation with Dr Fransisca Connors McNatt at [facility] I have put referral in for our schedulers. Please let patient know that we have started the process. If patient does not hear back from [facility] in ~ a week, we will need to follow up.  thanks

## 2015-10-28 NOTE — Telephone Encounter (Signed)
Pt notified of message below. Will call us if she does not hear from [facility] in ~ a week

## 2015-10-28 NOTE — Telephone Encounter (Signed)
Pt appt to see Dr. Elisha Headland is 11/12/15@8 :70. Medical records faxed. Pt is aware of appt.

## 2015-11-08 ENCOUNTER — Other Ambulatory Visit: Payer: Self-pay | Admitting: *Deleted

## 2015-11-08 DIAGNOSIS — C569 Malignant neoplasm of unspecified ovary: Secondary | ICD-10-CM

## 2015-11-10 ENCOUNTER — Other Ambulatory Visit: Payer: Self-pay | Admitting: Oncology

## 2015-11-10 DIAGNOSIS — C563 Malignant neoplasm of bilateral ovaries: Secondary | ICD-10-CM

## 2015-11-10 DIAGNOSIS — C562 Malignant neoplasm of left ovary: Principal | ICD-10-CM

## 2015-11-10 DIAGNOSIS — C561 Malignant neoplasm of right ovary: Secondary | ICD-10-CM

## 2015-11-11 ENCOUNTER — Encounter: Payer: Self-pay | Admitting: Oncology

## 2015-11-11 ENCOUNTER — Other Ambulatory Visit (HOSPITAL_BASED_OUTPATIENT_CLINIC_OR_DEPARTMENT_OTHER): Payer: 59

## 2015-11-11 ENCOUNTER — Ambulatory Visit (HOSPITAL_BASED_OUTPATIENT_CLINIC_OR_DEPARTMENT_OTHER): Payer: 59 | Admitting: Oncology

## 2015-11-11 ENCOUNTER — Encounter: Payer: Self-pay | Admitting: *Deleted

## 2015-11-11 ENCOUNTER — Encounter: Payer: 59 | Admitting: *Deleted

## 2015-11-11 VITALS — BP 102/65 | HR 66 | Temp 98.2°F | Ht 62.75 in | Wt 147.9 lb

## 2015-11-11 DIAGNOSIS — Z803 Family history of malignant neoplasm of breast: Secondary | ICD-10-CM | POA: Diagnosis not present

## 2015-11-11 DIAGNOSIS — C569 Malignant neoplasm of unspecified ovary: Secondary | ICD-10-CM

## 2015-11-11 DIAGNOSIS — Z1509 Genetic susceptibility to other malignant neoplasm: Secondary | ICD-10-CM

## 2015-11-11 DIAGNOSIS — C562 Malignant neoplasm of left ovary: Secondary | ICD-10-CM | POA: Diagnosis not present

## 2015-11-11 DIAGNOSIS — R922 Inconclusive mammogram: Secondary | ICD-10-CM

## 2015-11-11 DIAGNOSIS — Z1501 Genetic susceptibility to malignant neoplasm of breast: Secondary | ICD-10-CM

## 2015-11-11 DIAGNOSIS — C561 Malignant neoplasm of right ovary: Secondary | ICD-10-CM | POA: Diagnosis not present

## 2015-11-11 DIAGNOSIS — Z86711 Personal history of pulmonary embolism: Secondary | ICD-10-CM

## 2015-11-11 DIAGNOSIS — E8940 Asymptomatic postprocedural ovarian failure: Secondary | ICD-10-CM

## 2015-11-11 DIAGNOSIS — C563 Malignant neoplasm of bilateral ovaries: Secondary | ICD-10-CM

## 2015-11-11 LAB — COMPREHENSIVE METABOLIC PANEL
ALBUMIN: 3.9 g/dL (ref 3.5–5.0)
ALK PHOS: 63 U/L (ref 40–150)
ALT: 14 U/L (ref 0–55)
AST: 15 U/L (ref 5–34)
Anion Gap: 8 mEq/L (ref 3–11)
BILIRUBIN TOTAL: 0.4 mg/dL (ref 0.20–1.20)
BUN: 10.8 mg/dL (ref 7.0–26.0)
CALCIUM: 9.3 mg/dL (ref 8.4–10.4)
CO2: 28 mEq/L (ref 22–29)
Chloride: 106 mEq/L (ref 98–109)
Creatinine: 1 mg/dL (ref 0.6–1.1)
EGFR: 67 mL/min/{1.73_m2} — AB (ref >90)
Glucose: 82 mg/dl (ref 70–140)
POTASSIUM: 3.8 meq/L (ref 3.5–5.1)
Sodium: 142 mEq/L (ref 136–145)
TOTAL PROTEIN: 7.1 g/dL (ref 6.4–8.3)

## 2015-11-11 LAB — CBC WITH DIFFERENTIAL/PLATELET
BASO%: 0.6 % (ref 0.0–2.0)
BASOS ABS: 0 10*3/uL (ref 0.0–0.1)
EOS ABS: 0.1 10*3/uL (ref 0.0–0.5)
EOS%: 2.2 % (ref 0.0–7.0)
HEMATOCRIT: 39.9 % (ref 34.8–46.6)
HEMOGLOBIN: 13.3 g/dL (ref 11.6–15.9)
LYMPH#: 1.6 10*3/uL (ref 0.9–3.3)
LYMPH%: 33.5 % (ref 14.0–49.7)
MCH: 31.1 pg (ref 25.1–34.0)
MCHC: 33.4 g/dL (ref 31.5–36.0)
MCV: 93.3 fL (ref 79.5–101.0)
MONO#: 0.3 10*3/uL (ref 0.1–0.9)
MONO%: 6.1 % (ref 0.0–14.0)
NEUT%: 57.6 % (ref 38.4–76.8)
NEUTROS ABS: 2.7 10*3/uL (ref 1.5–6.5)
PLATELETS: 212 10*3/uL (ref 145–400)
RBC: 4.28 10*6/uL (ref 3.70–5.45)
RDW: 12.3 % (ref 11.2–14.5)
WBC: 4.7 10*3/uL (ref 3.9–10.3)
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
## 2015-11-12 LAB — CA 125: Cancer Antigen (CA) 125: 5.6 U/mL (ref 0.0–38.1)

## 2015-11-13 ENCOUNTER — Other Ambulatory Visit (HOSPITAL_BASED_OUTPATIENT_CLINIC_OR_DEPARTMENT_OTHER): Payer: 59

## 2015-11-13 ENCOUNTER — Encounter: Payer: 59 | Admitting: *Deleted

## 2015-11-13 ENCOUNTER — Telehealth: Payer: Self-pay

## 2015-11-13 DIAGNOSIS — C569 Malignant neoplasm of unspecified ovary: Secondary | ICD-10-CM

## 2015-11-13 LAB — RESEARCH LABS

## 2015-11-13 NOTE — Telephone Encounter (Signed)
-----   Message from Gordy Levan, MD sent at 11/12/2015  7:59 AM EDT ----- Labs seen and need follow up Please let her know ca 125 marker stable in good low range. NOTE she has apt at [facility] early 7-11 AM if you have trouble reaching her

## 2015-11-13 NOTE — Progress Notes (Signed)
11/11/2015 See Consent form encounters for GOG 0225 and DCP-001 studies for details regarding today's visit. Cindy S. Brigitte Pulse BSN, RN, [place] 11/13/2015 10:42 AM

## 2015-11-13 NOTE — Progress Notes (Signed)
11/13/2015 See Consent form encounters for GOG 0225 and DCP-001 studies for details regarding today's visit. Cindy S. Brigitte Pulse BSN, RN, CCRP 11/13/2015 10:40 AM

## 2015-11-13 NOTE — Telephone Encounter (Signed)
Told Elizabeth Henry the results of CA-125 as noted below by Dr. Marko Plume.

## 2015-12-03 ENCOUNTER — Telehealth: Payer: Self-pay

## 2015-12-03 NOTE — Telephone Encounter (Signed)
Elizabeth Henry would like further assistance in finding a surgeon for the double mastectomy closer to home.  [place] or [place].   The couple of visits to [place] with Elizabeth Henry were too far and would be costly in gas.  Left a message that her insurance -[employer] would dictate which physicians  are in her net work for these cities.  She can give Elizabeth Henry names of surgeons in her network and she can make suggestions and ultimately a referral to the surgeon she chooses.

## 2015-12-11 ENCOUNTER — Telehealth: Payer: Self-pay

## 2015-12-11 NOTE — Telephone Encounter (Signed)
LM for Ms Rocc[place] stating the names of surgeons at [facility]. Drs. Rolm Bookbinder, Dr Autumn Messing,  Stark Klein, Fanny Skates, and [employer].   Stated that these physicians in participate in the [facility]  for breast cancer patient's at [facility].

## 2015-12-17 ENCOUNTER — Telehealth: Payer: Self-pay

## 2015-12-17 NOTE — Telephone Encounter (Signed)
Told Elizabeth Henry  That Dr. Marko Plume is fine to refer her to [facility]. Robina Ade and Berline Lopes with [facility]  In [place]. Appointment with Dr. Robina Ade is tomorrow 12-18-15 at 0930. [phone]. Fax: [phone].  Appointment with Dr. Berline Lopes is Friday 12-20-15 at 1030. [id number] [phone]   Faxed Dr. Mariana Kaufman new patient consult on 02-08-15,notes from visits  On 07-08-15; 09-12-15,and 11-11-15. Recent labs. Surgical  path report from 04-16-15 for Ovarian  Cancer Genetic Testing information from 02-28-15. Recent Breast MRI and mammograms CT scan A/Pfrom 07-22-15, CXR from 04-12-15.

## 2015-12-23 ENCOUNTER — Other Ambulatory Visit: Payer: 59

## 2015-12-23 ENCOUNTER — Ambulatory Visit: Payer: 59 | Admitting: Gynecologic Oncology

## 2016-01-07 ENCOUNTER — Other Ambulatory Visit: Payer: Self-pay | Admitting: Oncology

## 2016-01-08 ENCOUNTER — Other Ambulatory Visit (HOSPITAL_BASED_OUTPATIENT_CLINIC_OR_DEPARTMENT_OTHER): Payer: 59

## 2016-01-08 ENCOUNTER — Ambulatory Visit: Payer: 59 | Attending: Gynecologic Oncology | Admitting: Gynecologic Oncology

## 2016-01-08 ENCOUNTER — Encounter: Payer: Self-pay | Admitting: Gynecologic Oncology

## 2016-01-08 VITALS — BP 108/76 | HR 87 | Temp 98.2°F | Resp 18 | Ht 62.75 in | Wt 149.8 lb

## 2016-01-08 DIAGNOSIS — C561 Malignant neoplasm of right ovary: Secondary | ICD-10-CM

## 2016-01-08 DIAGNOSIS — C569 Malignant neoplasm of unspecified ovary: Secondary | ICD-10-CM | POA: Diagnosis not present

## 2016-01-08 DIAGNOSIS — Z803 Family history of malignant neoplasm of breast: Secondary | ICD-10-CM | POA: Insufficient documentation

## 2016-01-08 DIAGNOSIS — Z885 Allergy status to narcotic agent status: Secondary | ICD-10-CM | POA: Insufficient documentation

## 2016-01-08 DIAGNOSIS — C563 Malignant neoplasm of bilateral ovaries: Secondary | ICD-10-CM

## 2016-01-08 DIAGNOSIS — Z830 Family history of human immunodeficiency virus [HIV] disease: Secondary | ICD-10-CM | POA: Diagnosis not present

## 2016-01-08 DIAGNOSIS — F419 Anxiety disorder, unspecified: Secondary | ICD-10-CM | POA: Insufficient documentation

## 2016-01-08 DIAGNOSIS — Z808 Family history of malignant neoplasm of other organs or systems: Secondary | ICD-10-CM | POA: Insufficient documentation

## 2016-01-08 DIAGNOSIS — Z8543 Personal history of malignant neoplasm of ovary: Secondary | ICD-10-CM | POA: Diagnosis not present

## 2016-01-08 DIAGNOSIS — Z8701 Personal history of pneumonia (recurrent): Secondary | ICD-10-CM | POA: Diagnosis not present

## 2016-01-08 DIAGNOSIS — Z87891 Personal history of nicotine dependence: Secondary | ICD-10-CM | POA: Diagnosis not present

## 2016-01-08 DIAGNOSIS — K219 Gastro-esophageal reflux disease without esophagitis: Secondary | ICD-10-CM | POA: Diagnosis not present

## 2016-01-08 DIAGNOSIS — Z1501 Genetic susceptibility to malignant neoplasm of breast: Secondary | ICD-10-CM | POA: Diagnosis not present

## 2016-01-08 DIAGNOSIS — Z1509 Genetic susceptibility to other malignant neoplasm: Secondary | ICD-10-CM

## 2016-01-08 DIAGNOSIS — Z806 Family history of leukemia: Secondary | ICD-10-CM | POA: Insufficient documentation

## 2016-01-08 DIAGNOSIS — Z86711 Personal history of pulmonary embolism: Secondary | ICD-10-CM | POA: Diagnosis not present

## 2016-01-08 DIAGNOSIS — Z90722 Acquired absence of ovaries, bilateral: Secondary | ICD-10-CM | POA: Insufficient documentation

## 2016-01-08 DIAGNOSIS — E8940 Asymptomatic postprocedural ovarian failure: Secondary | ICD-10-CM | POA: Diagnosis not present

## 2016-01-08 DIAGNOSIS — C562 Malignant neoplasm of left ovary: Secondary | ICD-10-CM

## 2016-01-08 DIAGNOSIS — Z833 Family history of diabetes mellitus: Secondary | ICD-10-CM | POA: Diagnosis not present

## 2016-01-08 DIAGNOSIS — Z888 Allergy status to other drugs, medicaments and biological substances status: Secondary | ICD-10-CM | POA: Insufficient documentation

## 2016-01-08 DIAGNOSIS — Z90710 Acquired absence of both cervix and uterus: Secondary | ICD-10-CM | POA: Insufficient documentation

## 2016-01-08 MED ORDER — ESTRADIOL 0.5 MG PO TABS: 1.0000 mg | ORAL_TABLET | Freq: Every day | ORAL | 11 refills | Status: DC

## 2016-01-08 NOTE — Progress Notes (Signed)
Consult Note: Gyn-Onc  Consult was requested by Dr. Kennon Rounds for the evaluation of Elizabeth Henry 45 y.o. female   CC:  Chief Complaint  Patient presents with  . Ovarian Cancer    follow up visit    Assessment/Plan:  Elizabeth Henry  is a 45 y.o.  year old with stage IV ovarian cancer in the setting of acute PE, s/p 3 cycles of neoadjuvant chemotherapy s/p ex lap, TAH, BSO, omentectomy, interval debulking to no macroscopic residual disease on 05/02/15. 3 additional cycles of chemotherapy with carboplatin and paclitaxel completed on 06/24/15.  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  BRCA 1 deleterious mutation positive.  She has had a complete response to therapy with no evidence of recurrence.  Low libido and menopausal symptoms - will increase estradiol dose to 81m daily.  Follow-up with Dr Elizabeth Plumein 3 months and with me in 6 months. Counseled regarding symptoms of recurrence and to return to see me if these develop.   HPI: SEllise Kovackis a very pleasant 45year old G2P2 who was initially seen in consultation at the request of Dr PKennon Roundsfor bilateral  Ovarian masses, ascites, elevated CA-125, and abnormal uterine bleeding. The patient began experiencing abdominal bloating and early satiety 6 weeks ago in July 2016. She since the end of August 2016 she has experienced vaginal bleeding every day that is light. She denies pain other than stretching pain in her abdomen. She's had some constipation but no change in the caliber of his stools and no blood in her bowel movements.  She was evaluated by Dr. PKennon Roundson 01/25/2015. A pelvic and abdominal ultrasound scan were ordered and revealed large volume of ascites in the abdomen, bilateral ovarian masses with the right measuring 24.3 x 13.1 x 25.3 cm with solid and cystic components and blood flow within the solid components. The left ovary measured 14.5 x 12.7 x 14.8 cm and  containing solid cystic components with increased blood flow. The endometrial stripe was slightly thickened at 9 mm (cc patient is premenopausal). A CA-125 was drawn on 01/28/2015 and was elevated at 3142 units per milliliter.  A CT chest was performed on 01/31/15 which showed an acute PE and pleural effusions.  She was started on therapeutic lovenox.  She was started on neoadjuvant chemotherapy (due to her acute PE and the concern about discontinuing anticoagulation for surgery in the acute setting).  Prior to starting chemotherapy she underwent paracentesis and thoracentesis (right) both of which confirmed metastatic adencarcinoma consistent with gyn primary.  On 02/12/15 she began neoadjuvant chemotherapy with 3 cycles of paclitaxel and carboplatin.  She underwent BRCA deleterious testing which was positive for a BRCA 1 mutation.  CT on 03/29/15 showed interval resolution of ascites and significant improvement in upper abdominal disease with persistent ovarian cysts.  On 04/16/15 she underwent an ex lap, TAH, BSO, omentectomy and radical tumor debulking to no gross residual disease. She tolerated surgery well.  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  She went on to complete 3 additional adjuvant chemotherapy cycles with carboplatin and paclitaxel. She tolerated treatment well with no concerns. Last cycle of treatment was 06/24/15.  CT abdo/pelvis and chest on 07/22/15 showed no measurable residual disease. The IVC filter appeared to have migrated to the ovarian vessel.  Interval Hx:  Last CA 125 normal in August at 5. Scheduled for risk reducing mastectomy in  September 2017/  No bloating, pain, early satiety, edema, cough. Reports low libido and central weight gain.  Current Meds:  Outpatient Encounter Prescriptions as of 01/08/2016  Medication Sig  . Cholecalciferol (VITAMIN D) 2000 UNITS tablet Take  4,000 Units by mouth daily.  Marland Kitchen estradiol (ESTRACE) 0.5 MG tablet Take 1 tablet (0.5 mg total) by mouth daily.  . hydrOXYzine (ATARAX/VISTARIL) 25 MG tablet Take 50 mg by mouth at bedtime.   Marland Kitchen LINZESS 145 MCG CAPS capsule Take 1 capsule by mouth at bedtime as needed (help use the bathroom.).   Marland Kitchen LORazepam (ATIVAN) 1 MG tablet 1 SL or PO every 6 hours as needed for nausea. May use 1-2 tablets  HS as needed  . montelukast (SINGULAIR) 10 MG tablet Take 10 mg by mouth at bedtime.  . naproxen (NAPROSYN) 500 MG tablet Take 500 mg by mouth as needed (for hip and shoulder pain). Takes two tablets  . pantoprazole (PROTONIX) 40 MG tablet Take 40 mg by mouth daily.  Marland Kitchen PREVIDENT 5000 BOOSTER PLUS 1.1 % PSTE Reported on 09/12/2015  . sennosides-docusate sodium (SENOKOT-S) 8.6-50 MG tablet Take 2-4 tablets by mouth at bedtime as needed for constipation.   . SUMAtriptan (IMITREX) 100 MG tablet Take 100 mg by mouth as needed for migraine. Reported on 11/11/2015  . triamcinolone cream (KENALOG) 0.1 % For exzema  . venlafaxine (EFFEXOR) 100 MG tablet Take 100 mg by mouth 2 (two) times daily.    No facility-administered encounter medications on file as of 01/08/2016.     Allergy:  Allergies  Allergen Reactions  . Ambien [Zolpidem Tartrate] Swelling  . Compazine [Prochlorperazine Edisylate] Hives  . Tylox [Oxycodone-Acetaminophen] Hives    Social Hx:   Social History   Social History  . Marital status: Married    Spouse name: N/A  . Number of children: 2  . Years of education: 16   Occupational History  . Self-employed    Social History Main Topics  . Smoking status: Former Smoker    Packs/day: 1.00    Years: 10.00    Types: Cigarettes    Quit date: 10/18/2002  . Smokeless tobacco: Never Used     Comment: smoked on and off for 10 yrs total - up to 1 ppd at most  . Alcohol use No     Comment: very rarely  . Drug use: No  . Sexual activity: Not Currently   Other Topics Concern  . Not on file    Social History Narrative   Fun: Go to [employer]; crochet   Denies religious beliefs effecting health care.     Past Surgical Hx:  Past Surgical History:  Procedure Laterality Date  . ABDOMINAL HYSTERECTOMY Bilateral 04/16/2015   Procedure: HYSTERECTOMY ABDOMINAL TOTAL, EXPLORATORY LAPAROTOMY, BILATERAL SALPINGO OOPHERECTOMY, OMENTECTOMY, DEBULKING;  Surgeon: Everitt Amber, MD;  Location: [facility];  Service: Gynecology;  Laterality: Bilateral;  . WISDOM TOOTH EXTRACTION      Past Medical Hx:  Past Medical History:  Diagnosis Date  . Allergy   . Anxiety   . Cancer ([place])   . GERD (gastroesophageal reflux disease)   . Migraines   . Pneumonia    bronchialpneumonia circa 5462  . Pulmonary embolism [facility]) October 2016  . Strep throat     Past Gynecological History:  Remote hx of abn paps.  Patient's last menstrual period was 01/08/2015 (approximate).  Family Hx:  Family History  Problem Relation Age of Onset  . Breast cancer Mother 60  . Healthy  Father   . Breast cancer Maternal Grandmother     dx. 39 or younger  . Brain cancer Paternal Grandmother     unspecified tumor type  . HIV Brother   . Leukemia Paternal Uncle   . HIV Brother   . Alzheimer's disease Maternal Grandfather   . Diabetes Maternal Grandfather   . Breast cancer Other   . Cancer Paternal Uncle     unspecified type    Review of Systems:  Constitutional  Feels distended  ENT Normal appearing ears and nares bilaterally Skin/Breast  No rash, sores, jaundice, itching, dryness Cardiovascular  No chest pain, shortness of breath, or edema  Pulmonary  No cough or wheeze.  Gastro Intestinal  No nausea, vomitting, or diarrhoea.  Genito Urinary  No frequency, urgency, dysuria, no vaginal bleeding Musculo Skeletal  No myalgia, arthralgia, joint swelling or pain  Neurologic  No weakness, numbness, change in gait,  Psychology  No depression, anxiety, insomnia.   Vitals:  Blood pressure 108/76, pulse  87, temperature 98.2 F (36.8 C), temperature source Oral, resp. rate 18, height 5' 2.75" (1.594 m), weight 149 lb 12.8 oz (67.9 kg), last menstrual period 01/08/2015, SpO2 100 %.  Physical Exam: WD in NAD Neck  Supple NROM, without any enlargements.  Lymph Node Survey No cervical supraclavicular or inguinal adenopathy Cardiovascular  Pulse normal rate, regularity and rhythm. S1 and S2 normal.  Lungs  Clear to auscultation bilateraly, without wheezes/crackles/rhonchi. Good air movement.  Skin  No rash/lesions/breakdown  Psychiatry  Alert and oriented to person, place, and time  Abdomen  Normoactive bowel sounds, abdomen non distended with well healed incision. No masses. Back No CVA tenderness Genito Urinary  Vulva/vagina: Normal external female genitalia.   No lesions. No discharge or bleeding.  Bladder/urethra:  No lesions or masses, well supported bladder  Vagina: cuff healing normally, in tact, no masses  Cervix and uterus: surgically absent  Adnexa: no masses. Rectal  Good tone, no masses no cul de sac nodularity.  Extremities  No bilateral cyanosis, clubbing or edema.   Donaciano Eva, MD  01/08/2016, 1:06 PM

## 2016-01-08 NOTE — Patient Instructions (Signed)
Follow up with Dr Everitt Amber in 6 months

## 2016-01-09 LAB — CA 125: CANCER ANTIGEN (CA) 125: 5.8 U/mL (ref 0.0–38.1)

## 2016-01-10 ENCOUNTER — Other Ambulatory Visit: Payer: Self-pay | Admitting: Gynecologic Oncology

## 2016-01-10 DIAGNOSIS — E2839 Other primary ovarian failure: Secondary | ICD-10-CM

## 2016-01-10 MED ORDER — ESTRADIOL 0.5 MG PO TABS: 1.0000 mg | ORAL_TABLET | Freq: Every day | ORAL | 12 refills | Status: DC

## 2016-01-14 ENCOUNTER — Telehealth: Payer: Self-pay

## 2016-01-14 NOTE — Telephone Encounter (Signed)
Told Elizabeth Henry the results of the CA-125 from 01-08-16 as noted below by Dr. Marko Plume.

## 2016-01-14 NOTE — Telephone Encounter (Signed)
-----   Message from Gordy Levan, MD sent at 01/12/2016  4:36 PM EDT ----- Labs seen and need follow up  Please let her know marker in good low range at 5.8.  She just saw Dr Denman George, but I do not think marker had resulted at visit   thanks

## 2016-02-23 ENCOUNTER — Other Ambulatory Visit: Payer: Self-pay | Admitting: Oncology

## 2016-02-24 ENCOUNTER — Ambulatory Visit (HOSPITAL_BASED_OUTPATIENT_CLINIC_OR_DEPARTMENT_OTHER): Payer: 59 | Admitting: Oncology

## 2016-02-24 ENCOUNTER — Other Ambulatory Visit (HOSPITAL_BASED_OUTPATIENT_CLINIC_OR_DEPARTMENT_OTHER): Payer: 59

## 2016-02-24 ENCOUNTER — Encounter: Payer: Self-pay | Admitting: Oncology

## 2016-02-24 ENCOUNTER — Encounter: Payer: Self-pay | Admitting: *Deleted

## 2016-02-24 ENCOUNTER — Telehealth: Payer: Self-pay | Admitting: Oncology

## 2016-02-24 ENCOUNTER — Other Ambulatory Visit: Payer: Self-pay | Admitting: *Deleted

## 2016-02-24 VITALS — BP 104/56 | HR 72 | Temp 98.1°F | Resp 18 | Ht 62.75 in | Wt 151.2 lb

## 2016-02-24 DIAGNOSIS — C561 Malignant neoplasm of right ovary: Secondary | ICD-10-CM | POA: Diagnosis not present

## 2016-02-24 DIAGNOSIS — Z803 Family history of malignant neoplasm of breast: Secondary | ICD-10-CM

## 2016-02-24 DIAGNOSIS — Z006 Encounter for examination for normal comparison and control in clinical research program: Secondary | ICD-10-CM

## 2016-02-24 DIAGNOSIS — Z1501 Genetic susceptibility to malignant neoplasm of breast: Secondary | ICD-10-CM | POA: Diagnosis not present

## 2016-02-24 DIAGNOSIS — Z1509 Genetic susceptibility to other malignant neoplasm: Secondary | ICD-10-CM

## 2016-02-24 DIAGNOSIS — C569 Malignant neoplasm of unspecified ovary: Secondary | ICD-10-CM

## 2016-02-24 DIAGNOSIS — C562 Malignant neoplasm of left ovary: Secondary | ICD-10-CM | POA: Diagnosis not present

## 2016-02-24 DIAGNOSIS — C563 Malignant neoplasm of bilateral ovaries: Secondary | ICD-10-CM

## 2016-02-24 DIAGNOSIS — E8940 Asymptomatic postprocedural ovarian failure: Secondary | ICD-10-CM

## 2016-02-24 DIAGNOSIS — Z9889 Other specified postprocedural states: Secondary | ICD-10-CM

## 2016-02-24 DIAGNOSIS — Z86711 Personal history of pulmonary embolism: Secondary | ICD-10-CM

## 2016-02-24 DIAGNOSIS — Z9013 Acquired absence of bilateral breasts and nipples: Secondary | ICD-10-CM

## 2016-02-24 LAB — CBC WITH DIFFERENTIAL/PLATELET
BASO%: 0.7 % (ref 0.0–2.0)
BASOS ABS: 0 10*3/uL (ref 0.0–0.1)
EOS%: 1.8 % (ref 0.0–7.0)
Eosinophils Absolute: 0.1 10*3/uL (ref 0.0–0.5)
HEMATOCRIT: 38.9 % (ref 34.8–46.6)
HEMOGLOBIN: 12.7 g/dL (ref 11.6–15.9)
LYMPH#: 1.4 10*3/uL (ref 0.9–3.3)
LYMPH%: 30.3 % (ref 14.0–49.7)
MCH: 29.6 pg (ref 25.1–34.0)
MCHC: 32.7 g/dL (ref 31.5–36.0)
MCV: 90.5 fL (ref 79.5–101.0)
MONO#: 0.3 10*3/uL (ref 0.1–0.9)
MONO%: 6.4 % (ref 0.0–14.0)
NEUT#: 2.8 10*3/uL (ref 1.5–6.5)
NEUT%: 60.8 % (ref 38.4–76.8)
PLATELETS: 227 10*3/uL (ref 145–400)
RBC: 4.3 10*6/uL (ref 3.70–5.45)
RDW: 12.8 % (ref 11.2–14.5)
WBC: 4.5 10*3/uL (ref 3.9–10.3)

## 2016-02-24 LAB — COMPREHENSIVE METABOLIC PANEL
ALBUMIN: 3.4 g/dL — AB (ref 3.5–5.0)
ALK PHOS: 78 U/L (ref 40–150)
ALT: 12 U/L (ref 0–55)
ANION GAP: 9 meq/L (ref 3–11)
AST: 14 U/L (ref 5–34)
BUN: 10.4 mg/dL (ref 7.0–26.0)
CALCIUM: 9.2 mg/dL (ref 8.4–10.4)
CO2: 28 mEq/L (ref 22–29)
CREATININE: 0.9 mg/dL (ref 0.6–1.1)
Chloride: 106 mEq/L (ref 98–109)
EGFR: 79 mL/min/{1.73_m2} — ABNORMAL LOW (ref >90)
Glucose: 88 mg/dl (ref 70–140)
POTASSIUM: 3.6 meq/L (ref 3.5–5.1)
Sodium: 142 mEq/L (ref 136–145)
Total Bilirubin: <0.22 mg/dL (ref 0.20–1.20)
Total Protein: 6.7 g/dL (ref 6.4–8.3)

## 2016-02-24 MED ORDER — FLUCONAZOLE 100 MG PO TABS: 100.0000 mg | ORAL_TABLET | Freq: Every day | ORAL | 0 refills | Status: DC

## 2016-02-24 NOTE — Progress Notes (Signed)
02/24/2016 1355 GOG 225 month 3 visit The patient was in the clinic today for lab and MD visit.  CA 125,  per protocol requirements,  was obtained.  Weight and vital signs were obtained.  The patient stated she was doing well.  She stated she had a bilateral prophylactic mastectomy at [facility] on 01/29/16.  She stated she continues to recover from surgery.   She currently complains of "yeast" as a side effect from antibiotics she took for a UTI.  She also has some constipation ,as a result of pain medications, and stated it is improving.  The patient stated she has occasional pains in the back of her legs after she sits for long periods of time and that it scares her since she had blood clots in the past.  She currently denies leg pain.   Her current medications were reviewed with the clinic RN while this RN was present.  The patient stated she had a flu shot on 02/21/16. Her waist was measured twice according to the GOG 225 instructions and was witnessed by a second Therapist, sports.  The waist measurement was 87 cm. The patient stated she is receiving the phone calls regularly from the health coach and that it has been nice to be a part of the study.  This RN thanked the patient for her time. Marcellus Scott, RN, BSN, MHA, OCN
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
## 2016-02-24 NOTE — Telephone Encounter (Signed)
Gave patient avs report and appointments for December and April 2018 with Dr. Denman George.

## 2016-02-25 ENCOUNTER — Telehealth: Payer: Self-pay | Admitting: *Deleted

## 2016-02-25 DIAGNOSIS — Z9013 Acquired absence of bilateral breasts and nipples: Secondary | ICD-10-CM | POA: Insufficient documentation

## 2016-02-25 DIAGNOSIS — Z9889 Other specified postprocedural states: Secondary | ICD-10-CM | POA: Insufficient documentation

## 2016-02-25 LAB — CA 125: Cancer Antigen (CA) 125: 5.2 U/mL (ref 0.0–38.1)

## 2016-02-25 NOTE — Telephone Encounter (Signed)
-----   Message from Gordy Levan, MD sent at 02/25/2016  8:27 AM EDT ----- Labs seen and need follow up please let her know CA 125 marker in good range at 5.2. Chemistries also all good other than albumin a little low, likely reflecting recent surgery. She needs to be sure to get lots of protein in diet for the wound healing   thanks

## 2016-02-25 NOTE — Telephone Encounter (Signed)
Pt notified of Dr Mariana Kaufman note below. Verbalized understanding.

## 2016-03-13 ENCOUNTER — Other Ambulatory Visit: Payer: Self-pay | Admitting: *Deleted

## 2016-03-13 ENCOUNTER — Telehealth: Payer: Self-pay

## 2016-03-13 ENCOUNTER — Encounter (HOSPITAL_COMMUNITY): Payer: Self-pay

## 2016-03-13 DIAGNOSIS — C569 Malignant neoplasm of unspecified ovary: Secondary | ICD-10-CM

## 2016-03-13 NOTE — Telephone Encounter (Signed)
Person answered her phone stating t hat she could take a message for the patient as she was not available.  I asked that she call regarding her lab in December as she will need to be fasting and her appt is not until the afternoon.    Webb Silversmith

## 2016-03-16 ENCOUNTER — Other Ambulatory Visit: Payer: 59

## 2016-03-16 ENCOUNTER — Ambulatory Visit: Payer: 59 | Admitting: Oncology

## 2016-04-08 DIAGNOSIS — C50919 Malignant neoplasm of unspecified site of unspecified female breast: Secondary | ICD-10-CM | POA: Diagnosis not present

## 2016-04-16 ENCOUNTER — Telehealth: Payer: Self-pay | Admitting: Oncology

## 2016-04-16 ENCOUNTER — Telehealth: Payer: Self-pay

## 2016-04-16 NOTE — Telephone Encounter (Signed)
Patient returned my call regarding her appointments and the need for her to be fasting.  Her appointments had actually been moved by scheduling and she is ok with the times and aware to be fasting.

## 2016-04-16 NOTE — Telephone Encounter (Signed)
lvm to inform pt of 12/28 appt time change per LL 12/14

## 2016-04-17 ENCOUNTER — Telehealth: Payer: Self-pay | Admitting: Oncology

## 2016-04-17 NOTE — Telephone Encounter (Signed)
sw pt to confirm r/s appt date/time per LL LOS

## 2016-04-30 ENCOUNTER — Other Ambulatory Visit: Payer: 59

## 2016-04-30 ENCOUNTER — Ambulatory Visit: Payer: 59 | Admitting: Oncology

## 2016-05-11 ENCOUNTER — Telehealth: Payer: Self-pay

## 2016-05-11 NOTE — Telephone Encounter (Signed)
LM tha t her appointment with Dr. Marko Plume is 05-14-16 at 0830 as she thought it was today and has a bad H/A and wanted to R/S appointment.

## 2016-05-13 ENCOUNTER — Telehealth: Payer: Self-pay

## 2016-05-13 ENCOUNTER — Other Ambulatory Visit: Payer: Self-pay | Admitting: Oncology

## 2016-05-13 DIAGNOSIS — C562 Malignant neoplasm of left ovary: Principal | ICD-10-CM

## 2016-05-13 DIAGNOSIS — C563 Malignant neoplasm of bilateral ovaries: Secondary | ICD-10-CM

## 2016-05-13 DIAGNOSIS — C561 Malignant neoplasm of right ovary: Secondary | ICD-10-CM

## 2016-05-13 NOTE — Telephone Encounter (Signed)
Called and left a message for the patient that she will need to be fasting tomorrow for her research labs and that she will have questioners to fill out.  I have asked that she call me to confirm the appointnment.,

## 2016-05-14 ENCOUNTER — Ambulatory Visit (HOSPITAL_BASED_OUTPATIENT_CLINIC_OR_DEPARTMENT_OTHER): Payer: BLUE CROSS/BLUE SHIELD | Admitting: Oncology

## 2016-05-14 ENCOUNTER — Encounter: Payer: Self-pay | Admitting: *Deleted

## 2016-05-14 ENCOUNTER — Other Ambulatory Visit (HOSPITAL_BASED_OUTPATIENT_CLINIC_OR_DEPARTMENT_OTHER): Payer: BLUE CROSS/BLUE SHIELD

## 2016-05-14 ENCOUNTER — Encounter: Payer: BLUE CROSS/BLUE SHIELD | Admitting: *Deleted

## 2016-05-14 VITALS — BP 109/65 | HR 74 | Temp 97.7°F | Resp 18 | Ht 62.75 in | Wt 182.3 lb

## 2016-05-14 DIAGNOSIS — Z006 Encounter for examination for normal comparison and control in clinical research program: Secondary | ICD-10-CM | POA: Diagnosis not present

## 2016-05-14 DIAGNOSIS — C569 Malignant neoplasm of unspecified ovary: Secondary | ICD-10-CM

## 2016-05-14 DIAGNOSIS — Z1509 Genetic susceptibility to other malignant neoplasm: Secondary | ICD-10-CM

## 2016-05-14 DIAGNOSIS — R5383 Other fatigue: Secondary | ICD-10-CM

## 2016-05-14 DIAGNOSIS — Z1502 Genetic susceptibility to malignant neoplasm of ovary: Secondary | ICD-10-CM

## 2016-05-14 DIAGNOSIS — Z9889 Other specified postprocedural states: Secondary | ICD-10-CM

## 2016-05-14 DIAGNOSIS — C561 Malignant neoplasm of right ovary: Secondary | ICD-10-CM | POA: Diagnosis not present

## 2016-05-14 DIAGNOSIS — C562 Malignant neoplasm of left ovary: Secondary | ICD-10-CM

## 2016-05-14 DIAGNOSIS — Z9013 Acquired absence of bilateral breasts and nipples: Secondary | ICD-10-CM | POA: Diagnosis not present

## 2016-05-14 DIAGNOSIS — Z1501 Genetic susceptibility to malignant neoplasm of breast: Secondary | ICD-10-CM

## 2016-05-14 DIAGNOSIS — C563 Malignant neoplasm of bilateral ovaries: Secondary | ICD-10-CM

## 2016-05-14 DIAGNOSIS — Z86711 Personal history of pulmonary embolism: Secondary | ICD-10-CM

## 2016-05-14 LAB — COMPREHENSIVE METABOLIC PANEL
ALT: 10 U/L (ref 0–55)
ANION GAP: 8 meq/L (ref 3–11)
AST: 16 U/L (ref 5–34)
Albumin: 3.9 g/dL (ref 3.5–5.0)
Alkaline Phosphatase: 82 U/L (ref 40–150)
BUN: 13 mg/dL (ref 7.0–26.0)
CALCIUM: 9.5 mg/dL (ref 8.4–10.4)
CHLORIDE: 105 meq/L (ref 98–109)
CO2: 27 meq/L (ref 22–29)
Creatinine: 1 mg/dL (ref 0.6–1.1)
EGFR: 66 mL/min/{1.73_m2} — ABNORMAL LOW (ref >90)
Glucose: 70 mg/dl (ref 70–140)
POTASSIUM: 3.8 meq/L (ref 3.5–5.1)
Sodium: 140 mEq/L (ref 136–145)
Total Bilirubin: 0.35 mg/dL (ref 0.20–1.20)
Total Protein: 7.3 g/dL (ref 6.4–8.3)

## 2016-05-14 LAB — CBC WITH DIFFERENTIAL/PLATELET
BASO%: 0.6 % (ref 0.0–2.0)
BASOS ABS: 0 10*3/uL (ref 0.0–0.1)
EOS%: 1.7 % (ref 0.0–7.0)
Eosinophils Absolute: 0.1 10*3/uL (ref 0.0–0.5)
HEMATOCRIT: 41.7 % (ref 34.8–46.6)
HGB: 13.8 g/dL (ref 11.6–15.9)
LYMPH#: 1.8 10*3/uL (ref 0.9–3.3)
LYMPH%: 34.6 % (ref 14.0–49.7)
MCH: 29.7 pg (ref 25.1–34.0)
MCHC: 33.1 g/dL (ref 31.5–36.0)
MCV: 89.7 fL (ref 79.5–101.0)
MONO#: 0.3 10*3/uL (ref 0.1–0.9)
MONO%: 6 % (ref 0.0–14.0)
NEUT#: 2.9 10*3/uL (ref 1.5–6.5)
NEUT%: 57.1 % (ref 38.4–76.8)
PLATELETS: 214 10*3/uL (ref 145–400)
RBC: 4.65 10*6/uL (ref 3.70–5.45)
RDW: 13 % (ref 11.2–14.5)
WBC: 5.2 10*3/uL (ref 3.9–10.3)

## 2016-05-14 LAB — TSH: TSH: 2.303 m[IU]/L (ref 0.308–3.960)

## 2016-05-14 LAB — RESEARCH LABS
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
## 2016-05-14 NOTE — Progress Notes (Signed)
05/14/2016 Patient in to clinic today for Month 6 evaluation. Upon arrival to clinic, fasting lab samples were collected for research purposes, along with standard of care labs and CA-125, following a fast of at least eight hours. Patient then began completing the required questionnaires for today's visit, including the AFFQ, APAQ and GOG RAND-36/GSRS-IBS questionnaires. Weight was obtained without shoes, in lightweight indoor clothing. Waist measurement was obtained according to protocol section 4.6.1. Physical exam was performed today by Dr. Marko Plume. Dr. 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 In addition the patient reported not sleeping more than two hours per night. Research nurse was present for collaborative nurse assessment, however, these complaints were not voiced at the time. Dr. Marko Plume to follow up with patient regarding symptoms. Cindy S. Brigitte Pulse BSN, RN, [place] 05/14/2016 2:01 PM  Adverse Event Log Study/Protocol: GOG 0225 Visit Month: 6 Ongoing baseline AEs (grade): fatigue (1), insomnia (2); constipation (2); headaches (2); anxiety (2) Event Grade Attribution  Leg/calf pain 2 Unrelated  Rhinorrhea 1 Unrelated  Epistaxis 1 Unrelated  Cindy S. Brigitte Pulse BSN, RN, [place] 05/21/2016 12:49 PM

## 2016-05-15 ENCOUNTER — Telehealth: Payer: Self-pay

## 2016-05-15 LAB — T3 UPTAKE
Free Thyroxine Index: 2.3 (ref 1.2–4.9)
T3 Uptake Ratio: 22 % — ABNORMAL LOW (ref 24–39)

## 2016-05-15 LAB — CA 125: CANCER ANTIGEN (CA) 125: 5.3 U/mL (ref 0.0–38.1)

## 2016-05-15 LAB — T4: T4 TOTAL: 10.5 ug/dL (ref 4.5–12.0)

## 2016-05-15 NOTE — Telephone Encounter (Signed)
LVM stating the results of the labs as noted below by Dr. Marko Plume.   Ms Ferrelli can call back to Dr. Mariana Kaufman office if she has any questions or concerns bout lab work.

## 2016-05-15 NOTE — Telephone Encounter (Signed)
-----   Message from Gordy Levan, MD sent at 05/15/2016  8:05 AM EST ----- Labs seen and need follow up:  Please let her know TSH in normal range and CA 125 in good low range, stable at 5.3

## 2016-05-16 ENCOUNTER — Encounter: Payer: Self-pay | Admitting: Oncology

## 2016-07-27 ENCOUNTER — Other Ambulatory Visit: Payer: Self-pay | Admitting: Family Medicine

## 2016-08-04 ENCOUNTER — Other Ambulatory Visit: Payer: Self-pay

## 2016-08-04 DIAGNOSIS — C569 Malignant neoplasm of unspecified ovary: Secondary | ICD-10-CM

## 2016-08-04 NOTE — Progress Notes (Signed)
Consult Note: Gyn-Onc  Consult was requested by Dr. Kennon Rounds for the evaluation of Elizabeth Henry 46 y.o. female   CC:  Chief Complaint  Patient presents with  . Ovarian Cancer    Assessment/Plan:  Elizabeth Henry  is a 46 y.o.  year old with stage IV ovarian cancer in the setting of acute PE, s/p 3 cycles of neoadjuvant chemotherapy s/p ex lap, TAH, BSO, omentectomy, interval debulking to no macroscopic residual disease on 05/02/15. 3 additional cycles of chemotherapy with carboplatin and paclitaxel completed on 06/24/15.  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  BRCA 1 deleterious mutation positive. s/p risk reducing mastectomy.  She has had a complete response to therapy with no evidence of recurrence.  Significant fatigue - recommend discussing referral for sleep study with her PCP, Dr Kenton Kingfisher.  Follow-up with me in 6 months (approximately 02/05/17). CT scan and appointment with Dr Alvy Bimler in 3 months.  Counseled regarding symptoms of recurrence and to return to see me if these develop.    HPI: Elizabeth Henry is a very pleasant 46 year old G2P2 who was initially seen in consultation at the request of Dr Kennon Rounds for bilateral  Ovarian masses, ascites, elevated CA-125, and abnormal uterine bleeding. The patient began experiencing abdominal bloating and early satiety 6 weeks ago in July 2016. She since the end of August 2016 she has experienced vaginal bleeding every day that is light. She denies pain other than stretching pain in her abdomen. She's had some constipation but no change in the caliber of his stools and no blood in her bowel movements.  She was evaluated by Dr. Kennon Rounds on 01/25/2015. A pelvic and abdominal ultrasound scan were ordered and revealed large volume of ascites in the abdomen, bilateral ovarian masses with the right measuring 24.3 x 13.1 x 25.3 cm with solid and cystic components and blood flow within the solid  components. The left ovary measured 14.5 x 12.7 x 14.8 cm and containing solid cystic components with increased blood flow. The endometrial stripe was slightly thickened at 9 mm (cc patient is premenopausal). A CA-125 was drawn on 01/28/2015 and was elevated at 3142 units per milliliter.  A CT chest was performed on 01/31/15 which showed an acute PE and pleural effusions.  She was started on therapeutic lovenox.  She was started on neoadjuvant chemotherapy (due to her acute PE and the concern about discontinuing anticoagulation for surgery in the acute setting).  Prior to starting chemotherapy she underwent paracentesis and thoracentesis (right) both of which confirmed metastatic adencarcinoma consistent with gyn primary.  On 02/12/15 she began neoadjuvant chemotherapy with 3 cycles of paclitaxel and carboplatin.  She underwent BRCA deleterious testing which was positive for a BRCA 1 mutation.  CT on 03/29/15 showed interval resolution of ascites and significant improvement in upper abdominal disease with persistent ovarian cysts.  On 04/16/15 she underwent an ex lap, TAH, BSO, omentectomy and radical tumor debulking to no gross residual disease. She tolerated surgery well.  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  She went on to complete 3 additional adjuvant chemotherapy cycles with carboplatin and paclitaxel. She tolerated treatment well with no concerns. Last cycle of treatment was 06/24/15.  CT abdo/pelvis and chest on 07/22/15 showed no measurable residual disease. The IVC filter appeared to have migrated to the ovarian vessel.  She received a risk reducing mastectomy in September, 2017 at [place].  Interval Hx:  Last CA 125 normal on 08/05/16 is pending (previously stable at 5).  No bloating, pain, early satiety, edema, cough. Reports low libido and central weight gain.  Current Meds:  Outpatient  Encounter Prescriptions as of 08/05/2016  Medication Sig  . Acetaminophen 500 MG coapsule Take by mouth.  . Cholecalciferol (VITAMIN D) 2000 UNITS tablet Take 4,000 Units by mouth daily.  Marland Kitchen estradiol (ESTRACE) 0.5 MG tablet Take 2 tablets (1 mg total) by mouth daily.  . hydrOXYzine (ATARAX/VISTARIL) 25 MG tablet Take 50 mg by mouth at bedtime.   Marland Kitchen LINZESS 145 MCG CAPS capsule Take 1 capsule by mouth at bedtime as needed (help use the bathroom.).   Marland Kitchen LORazepam (ATIVAN) 1 MG tablet 1 SL or PO every 6 hours as needed for nausea. May use 1-2 tablets  HS as needed  . Melatonin 5 MG TABS Take 5 mg by mouth.  . montelukast (SINGULAIR) 10 MG tablet Take 10 mg by mouth at bedtime.  . pantoprazole (PROTONIX) 40 MG tablet Take 40 mg by mouth daily.  Marland Kitchen PREVIDENT 5000 BOOSTER PLUS 1.1 % PSTE Reported on 09/12/2015  . sennosides-docusate sodium (SENOKOT-S) 8.6-50 MG tablet Take 1 tablet by mouth at bedtime as needed for constipation.   . SUMAtriptan (IMITREX) 100 MG tablet Take 100 mg by mouth as needed for migraine. Reported on 11/11/2015  . triamcinolone (NASACORT) 55 MCG/ACT AERO nasal inhaler Place 1 spray into the nose daily as needed.  . triamcinolone cream (KENALOG) 0.1 % For exzema  . venlafaxine (EFFEXOR) 100 MG tablet Take 100 mg by mouth 2 (two) times daily.   . [DISCONTINUED] diazepam (VALIUM) 5 MG tablet   . [DISCONTINUED] ondansetron (ZOFRAN-ODT) 8 MG disintegrating tablet   . [DISCONTINUED] [facility]) GEL    No facility-administered encounter medications on file as of 08/05/2016.     Allergy:  Allergies  Allergen Reactions  . Ambien [Zolpidem Tartrate] Swelling  . Compazine [Prochlorperazine Edisylate] Hives  . Tylox [Oxycodone-Acetaminophen] Hives    Social Hx:   Social History   Social History  . Marital status: Married    Spouse name: N/A  . Number of children: 2  . Years of education: 16   Occupational History  . Self-employed    Social History Main  Topics  . Smoking status: Former Smoker    Packs/day: 1.00    Years: 10.00    Types: Cigarettes    Quit date: 10/18/2002  . Smokeless tobacco: Never Used     Comment: smoked on and off for 10 yrs total - up to 1 ppd at most  . Alcohol use No     Comment: very rarely  . Drug use: No  . Sexual activity: Not Currently   Other Topics Concern  . Not on file   Social History Narrative   Fun: Go to [employer]; crochet   Denies religious beliefs effecting health care.     Past Surgical Hx:  Past Surgical History:  Procedure Laterality Date  . ABDOMINAL HYSTERECTOMY Bilateral 04/16/2015   Procedure: HYSTERECTOMY ABDOMINAL TOTAL, EXPLORATORY LAPAROTOMY, BILATERAL SALPINGO OOPHERECTOMY, OMENTECTOMY, DEBULKING;  Surgeon: Everitt Amber, MD;  Location: [facility];  Service: Gynecology;  Laterality: Bilateral;  . WISDOM TOOTH EXTRACTION      Past Medical Hx:  Past Medical History:  Diagnosis Date  . Allergy   . Anxiety   . Cancer ([place])   . GERD (gastroesophageal reflux disease)   . Migraines   . Pneumonia  bronchialpneumonia circa 2890  . Pulmonary embolism [facility]) October 2016  . Strep throat     Past Gynecological History:  Remote hx of abn paps.  Patient's last menstrual period was 01/08/2015 (approximate).  Family Hx:  Family History  Problem Relation Age of Onset  . Breast cancer Mother 40  . Healthy Father   . Breast cancer Maternal Grandmother     dx. 46 or younger  . Brain cancer Paternal Grandmother     unspecified tumor type  . HIV Brother   . Leukemia Paternal Uncle   . HIV Brother   . Alzheimer's disease Maternal Grandfather   . Diabetes Maternal Grandfather   . Breast cancer Other   . Cancer Paternal Uncle     unspecified type    Review of Systems:  Constitutional  Feels distended  ENT Normal appearing ears and nares bilaterally Skin/Breast  No rash, sores, jaundice, itching, dryness Cardiovascular  No chest pain, shortness of breath, or edema   Pulmonary  No cough or wheeze.  Gastro Intestinal  No nausea, vomitting, or diarrhoea.  Genito Urinary  No frequency, urgency, dysuria, no vaginal bleeding Musculo Skeletal  No myalgia, arthralgia, joint swelling or pain  Neurologic  No weakness, numbness, change in gait,  Psychology  No depression, anxiety, insomnia.   Vitals:  Blood pressure 116/71, pulse 74, temperature 97.9 F (36.6 C), temperature source Oral, resp. rate 18, weight 155 lb (70.3 kg), last menstrual period 01/08/2015.  Physical Exam: WD in NAD Neck  Supple NROM, without any enlargements.  Lymph Node Survey No cervical supraclavicular or inguinal adenopathy Cardiovascular  Pulse normal rate, regularity and rhythm. S1 and S2 normal.  Lungs  Clear to auscultation bilateraly, without wheezes/crackles/rhonchi. Good air movement.  Skin  No rash/lesions/breakdown  Psychiatry  Alert and oriented to person, place, and time  Abdomen  Normoactive bowel sounds, abdomen non distended with well healed incision. No masses. Back No CVA tenderness Genito Urinary  Vulva/vagina: Normal external female genitalia.   No lesions. No discharge or bleeding.  Bladder/urethra:  No lesions or masses, well supported bladder  Vagina: cuff healing normally, in tact, no masses  Cervix and uterus: surgically absent  Adnexa: no masses. Rectal  Good tone, no masses no cul de sac nodularity.  Extremities  No bilateral cyanosis, clubbing or edema.   Donaciano Eva, MD  08/05/2016, 1:34 PM

## 2016-08-05 ENCOUNTER — Encounter: Payer: Self-pay | Admitting: Gynecologic Oncology

## 2016-08-05 ENCOUNTER — Other Ambulatory Visit (HOSPITAL_BASED_OUTPATIENT_CLINIC_OR_DEPARTMENT_OTHER): Payer: BLUE CROSS/BLUE SHIELD

## 2016-08-05 ENCOUNTER — Encounter: Payer: BLUE CROSS/BLUE SHIELD | Admitting: *Deleted

## 2016-08-05 ENCOUNTER — Ambulatory Visit: Payer: BLUE CROSS/BLUE SHIELD | Attending: Gynecologic Oncology | Admitting: Gynecologic Oncology

## 2016-08-05 VITALS — BP 116/71 | HR 74 | Temp 97.9°F | Resp 18 | Wt 155.0 lb

## 2016-08-05 DIAGNOSIS — Z90722 Acquired absence of ovaries, bilateral: Secondary | ICD-10-CM | POA: Diagnosis not present

## 2016-08-05 DIAGNOSIS — Z1501 Genetic susceptibility to malignant neoplasm of breast: Secondary | ICD-10-CM

## 2016-08-05 DIAGNOSIS — C562 Malignant neoplasm of left ovary: Secondary | ICD-10-CM | POA: Diagnosis not present

## 2016-08-05 DIAGNOSIS — Z86711 Personal history of pulmonary embolism: Secondary | ICD-10-CM

## 2016-08-05 DIAGNOSIS — Z90710 Acquired absence of both cervix and uterus: Secondary | ICD-10-CM

## 2016-08-05 DIAGNOSIS — Z9013 Acquired absence of bilateral breasts and nipples: Secondary | ICD-10-CM

## 2016-08-05 DIAGNOSIS — C569 Malignant neoplasm of unspecified ovary: Secondary | ICD-10-CM

## 2016-08-05 DIAGNOSIS — Z87891 Personal history of nicotine dependence: Secondary | ICD-10-CM | POA: Diagnosis not present

## 2016-08-05 DIAGNOSIS — C563 Malignant neoplasm of bilateral ovaries: Secondary | ICD-10-CM

## 2016-08-05 DIAGNOSIS — Z006 Encounter for examination for normal comparison and control in clinical research program: Secondary | ICD-10-CM

## 2016-08-05 DIAGNOSIS — C561 Malignant neoplasm of right ovary: Secondary | ICD-10-CM | POA: Insufficient documentation

## 2016-08-05 DIAGNOSIS — Z9221 Personal history of antineoplastic chemotherapy: Secondary | ICD-10-CM | POA: Diagnosis not present

## 2016-08-05 DIAGNOSIS — Z79899 Other long term (current) drug therapy: Secondary | ICD-10-CM | POA: Insufficient documentation

## 2016-08-05 NOTE — Patient Instructions (Signed)
Please schedule an appointment to see Dr Denman George for October 2018, try for 02/05/17. Our office will contact you with your CA 125 results.  Dr Denman George will put an order in for a CT Scan with IV contrast only, the CT Scan appointment should take place in July 2018.

## 2016-08-05 NOTE — Progress Notes (Signed)
08/05/2016 Patient in to clinic today for Month 9 visit and gyn oncology follow-up. 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 Waist measurement obtained per protocol. Patient and MD are aware that the Month 12 visit in July will include fasting research labs and completion of questionnaires. Discussed need for CT scan at Month 12 visit, and this will be ordered by Dr. Denman George today. Next gyn oncology visit expected in October 2018. Cindy S. Brigitte Pulse BSN, RN, [place] 08/05/2016 2:25 PM   Adverse Event Log Study/Protocol: GOG 0225 Visit Month: 9 05/14/2016 - 08/05/2016 Ongoing baseline AEs (grade): fatigue (1), insomnia (2); constipation (2); headaches (2); anxiety (2) Event Grade Attribution to Protocol Treatment (Lifestyle Intervention)  Fatigue 2 Unrelated  Low libido 1 Unrelated  Cindy S. Brigitte Pulse BSN, RN, CCRP 09/10/2016 1:30 PM

## 2016-08-06 LAB — CA 125: CANCER ANTIGEN (CA) 125: 4.7 U/mL (ref 0.0–38.1)

## 2016-08-11 ENCOUNTER — Telehealth: Payer: Self-pay

## 2016-08-11 NOTE — Telephone Encounter (Signed)
Told Ms Cotta the result of the CA-125 as noted below by Dr. Denman George. Told her the marker was 4.7.

## 2016-08-11 NOTE — Telephone Encounter (Signed)
-----   Message from Everitt Amber, MD sent at 08/07/2016  8:01 PM EDT ----- Would you mind letting the patient know that her CA 125 is normal? Thanks Terrence Dupont

## 2016-10-28 ENCOUNTER — Telehealth: Payer: Self-pay

## 2016-10-28 NOTE — Telephone Encounter (Signed)
Elizabeth Henry asked that I check on her Ct scan that was ordered back in April that had not been scheduled for next week.  I called central scheduling and spoke with Irine Seal she went ahead and scheduled the scan.  I called the patient and gave her the appointment information and to pick up contrast this week.

## 2016-11-02 ENCOUNTER — Ambulatory Visit (HOSPITAL_COMMUNITY): Admission: RE | Admit: 2016-11-02 | Payer: BLUE CROSS/BLUE SHIELD | Source: Ambulatory Visit

## 2016-11-03 ENCOUNTER — Encounter: Payer: Self-pay | Admitting: Gynecologic Oncology

## 2016-11-03 NOTE — Progress Notes (Signed)
Peer to peer done.  CT AP authorized with Auth# [id number].  NS with prior auth notified.

## 2016-11-05 ENCOUNTER — Telehealth: Payer: Self-pay | Admitting: *Deleted

## 2016-11-05 NOTE — Telephone Encounter (Signed)
Patient called and stated that her CT scan had be denied. Per Lenna Sciara APP advised the patient that a peer to peer was done and approved. Patient also sated that "Dr. Denman George said I would only need IV contrast, no oral." Advised the patient that IV contrast was fine per [facility] APP.

## 2016-11-09 ENCOUNTER — Other Ambulatory Visit: Payer: Self-pay | Admitting: Hematology and Oncology

## 2016-11-09 DIAGNOSIS — C569 Malignant neoplasm of unspecified ovary: Secondary | ICD-10-CM

## 2016-11-10 ENCOUNTER — Telehealth: Payer: Self-pay

## 2016-11-10 NOTE — Telephone Encounter (Signed)
Called patient and left a message asking if she could come in tomorrow 11/11/16 for fasting labs for Research as well as Dr. Alvy Bimler labs.  I have asked her to call me back and to leave a message with a good time if this is ok with her schedule.   Elizabeth Henry

## 2016-11-10 NOTE — Telephone Encounter (Signed)
Called patient back and she states that she will just keep her lab 7/12 and will be ok to fast until 11:30.  Patient also states that she is having IV contrast only(noted in order as well per Dr.Rossi),also per Central Scheduling if a patient is under 62 and has no contrast allergery and not diabetic then no labs are needed.  Elizabeth Henry has been made aware   Elizabeth Henry

## 2016-11-12 ENCOUNTER — Encounter: Payer: Self-pay | Admitting: *Deleted

## 2016-11-12 ENCOUNTER — Telehealth: Payer: Self-pay | Admitting: Hematology and Oncology

## 2016-11-12 ENCOUNTER — Other Ambulatory Visit (HOSPITAL_BASED_OUTPATIENT_CLINIC_OR_DEPARTMENT_OTHER): Payer: BLUE CROSS/BLUE SHIELD

## 2016-11-12 ENCOUNTER — Encounter: Payer: BLUE CROSS/BLUE SHIELD | Admitting: *Deleted

## 2016-11-12 ENCOUNTER — Ambulatory Visit (HOSPITAL_COMMUNITY)
Admission: RE | Admit: 2016-11-12 | Discharge: 2016-11-12 | Disposition: A | Discharge status: Home / Self Care | Payer: BLUE CROSS/BLUE SHIELD | Source: Ambulatory Visit | Attending: Gynecologic Oncology | Admitting: Gynecologic Oncology

## 2016-11-12 ENCOUNTER — Encounter: Payer: Self-pay | Admitting: Hematology and Oncology

## 2016-11-12 ENCOUNTER — Ambulatory Visit (HOSPITAL_BASED_OUTPATIENT_CLINIC_OR_DEPARTMENT_OTHER): Payer: BLUE CROSS/BLUE SHIELD | Admitting: Hematology and Oncology

## 2016-11-12 VITALS — BP 110/60 | HR 81 | Temp 98.2°F | Resp 20 | Ht 64.0 in | Wt 158.7 lb

## 2016-11-12 DIAGNOSIS — Z9013 Acquired absence of bilateral breasts and nipples: Secondary | ICD-10-CM

## 2016-11-12 DIAGNOSIS — C561 Malignant neoplasm of right ovary: Secondary | ICD-10-CM

## 2016-11-12 DIAGNOSIS — Z8543 Personal history of malignant neoplasm of ovary: Secondary | ICD-10-CM

## 2016-11-12 DIAGNOSIS — C569 Malignant neoplasm of unspecified ovary: Secondary | ICD-10-CM

## 2016-11-12 DIAGNOSIS — Z1502 Genetic susceptibility to malignant neoplasm of ovary: Secondary | ICD-10-CM | POA: Diagnosis not present

## 2016-11-12 DIAGNOSIS — Z9221 Personal history of antineoplastic chemotherapy: Secondary | ICD-10-CM | POA: Diagnosis not present

## 2016-11-12 DIAGNOSIS — C563 Malignant neoplasm of bilateral ovaries: Secondary | ICD-10-CM

## 2016-11-12 DIAGNOSIS — Z1501 Genetic susceptibility to malignant neoplasm of breast: Secondary | ICD-10-CM

## 2016-11-12 DIAGNOSIS — Z1509 Genetic susceptibility to other malignant neoplasm: Secondary | ICD-10-CM

## 2016-11-12 DIAGNOSIS — Z90710 Acquired absence of both cervix and uterus: Secondary | ICD-10-CM | POA: Diagnosis not present

## 2016-11-12 DIAGNOSIS — C562 Malignant neoplasm of left ovary: Secondary | ICD-10-CM | POA: Diagnosis present

## 2016-11-12 DIAGNOSIS — Z006 Encounter for examination for normal comparison and control in clinical research program: Secondary | ICD-10-CM

## 2016-11-12 LAB — COMPREHENSIVE METABOLIC PANEL
ALT: 11 U/L (ref 0–55)
ANION GAP: 6 meq/L (ref 3–11)
AST: 13 U/L (ref 5–34)
Albumin: 3.6 g/dL (ref 3.5–5.0)
Alkaline Phosphatase: 69 U/L (ref 40–150)
BUN: 10.6 mg/dL (ref 7.0–26.0)
CALCIUM: 8.9 mg/dL (ref 8.4–10.4)
CHLORIDE: 105 meq/L (ref 98–109)
CO2: 27 meq/L (ref 22–29)
Creatinine: 1 mg/dL (ref 0.6–1.1)
EGFR: 71 mL/min/{1.73_m2} — ABNORMAL LOW (ref >90)
Glucose: 85 mg/dl (ref 70–140)
POTASSIUM: 4.2 meq/L (ref 3.5–5.1)
Sodium: 139 mEq/L (ref 136–145)
Total Bilirubin: 0.3 mg/dL (ref 0.20–1.20)
Total Protein: 6.4 g/dL (ref 6.4–8.3)

## 2016-11-12 LAB — CBC WITH DIFFERENTIAL/PLATELET
BASO%: 0.8 % (ref 0.0–2.0)
BASOS ABS: 0 10*3/uL (ref 0.0–0.1)
EOS%: 1.8 % (ref 0.0–7.0)
Eosinophils Absolute: 0.1 10*3/uL (ref 0.0–0.5)
HEMATOCRIT: 40.2 % (ref 34.8–46.6)
HGB: 13 g/dL (ref 11.6–15.9)
LYMPH#: 1.8 10*3/uL (ref 0.9–3.3)
LYMPH%: 45.9 % (ref 14.0–49.7)
MCH: 29.6 pg (ref 25.1–34.0)
MCHC: 32.3 g/dL (ref 31.5–36.0)
MCV: 91.6 fL (ref 79.5–101.0)
MONO#: 0.3 10*3/uL (ref 0.1–0.9)
MONO%: 6.7 % (ref 0.0–14.0)
NEUT#: 1.8 10*3/uL (ref 1.5–6.5)
NEUT%: 44.8 % (ref 38.4–76.8)
Platelets: 184 10*3/uL (ref 145–400)
RBC: 4.39 10*6/uL (ref 3.70–5.45)
RDW: 13.2 % (ref 11.2–14.5)
WBC: 3.9 10*3/uL (ref 3.9–10.3)

## 2016-11-12 LAB — RESEARCH LABS

## 2016-11-12 MED ORDER — IOPAMIDOL (ISOVUE-300) INJECTION 61%
100.0000 mL | Freq: Once | INTRAVENOUS | Status: AC | PRN
  Administered 2016-11-12: 100 mL via INTRAVENOUS

## 2016-11-12 MED ORDER — IOPAMIDOL (ISOVUE-300) INJECTION 61%
INTRAVENOUS | Status: AC
  Filled 2016-11-12: qty 100

## 2016-11-12 NOTE — Progress Notes (Signed)
11/12/2016 Patient in to clinic today for Month 12 evaluation. See separate Research Consent Form encounter regarding patient notification of protocol and consent form changes. Upon arrival to clinic, fasting lab samples were collected for research purposes, along with standard of care labs and CA-125, following a fast of at least eight hours. Patient confirmed that she did not have oral contrast prior to today's CT scan and lab draw, as CT was performed with IV contrast alone. Patient then began completing the required questionnaires for today's visit, including the AFFQ, APAQ and GOG RAND-36/GSRS-IBS questionnaires. Height and weight were obtained without shoes, in lightweight indoor clothing. Waist measurement was obtained according to protocol section 4.6.1. Physical exam was performed today by Dr. Alvy Bimler.  Cindy S. Brigitte Pulse BSN, RN, [place] 11/12/2016 1:47 PM  Adverse Event Log Study/Protocol: GOG 0225 Visit Month 12: 08/05/2016 - 11/12/2016 Event Grade Attribution to Protocol Treatment (Lifestyle Intervention)  Weight gain 1 Unlikely  Cindy S. Brigitte Pulse BSN, RN, CCRP 12/16/2016 11:10 AM

## 2016-11-12 NOTE — Telephone Encounter (Signed)
Spoke with patient re January 2019 appointments. Central radiology will call patient re scan.

## 2016-11-13 ENCOUNTER — Telehealth: Payer: Self-pay | Admitting: *Deleted

## 2016-11-13 LAB — CA 125: Cancer Antigen (CA) 125: 4.8 U/mL (ref 0.0–38.1)

## 2016-11-13 NOTE — Telephone Encounter (Signed)
-----   Message from Heath Lark, MD sent at 11/13/2016  8:12 AM EDT ----- Regarding: CA 125 Pls let her know Lawrence ----- Message ----- From: Interface, Lab In Three Zero One Sent: 11/12/2016  10:37 AM To: Heath Lark, MD

## 2016-11-13 NOTE — Telephone Encounter (Signed)
LM to call for good results

## 2016-11-14 ENCOUNTER — Encounter: Payer: Self-pay | Admitting: Hematology and Oncology

## 2016-11-14 NOTE — Assessment & Plan Note (Signed)
Clinically, she had no signs of cancer recurrence Tumor markers are within normal limits. She will continue on GOG study She was sees GYN oncologist in approximately 3 months and I will see her back in 6 months

## 2016-11-14 NOTE — Assessment & Plan Note (Signed)
Chest wall examination is benign.  She does not need further mammogram 

## 2016-11-14 NOTE — Progress Notes (Signed)
[place] progress notes  Patient Care Team: Shirline Frees, MD as PCP - General (Family Medicine) Harlan Stains, MD as Referring Physician (Family Medicine) Benson Norway, RN as Registered Nurse (Oncology)  CHIEF COMPLAINTS/PURPOSE OF VISIT:  Bilateral ovarian cancer status post chemotherapy and surgery, BRCA mutation status post bilateral mastectomy  HISTORY OF PRESENTING ILLNESS:  Elizabeth Henry 46 y.o. female was transferred to my care after her prior physician has left.  I reviewed the patient's records extensive and collaborated the history with the patient. Summary of her history is as follows: Oncology History   ER patchy expression BRCA 1 mutation Participated in GOG 0225 study     Bilateral primary ovarian cancer ([place])   11/15/2014 Initial Diagnosis    She presented with abnormal heavy bleeding      01/25/2015 Imaging    US pelvis 1. Interval development of large solid and cystic masses in the adnexal regions bilaterally. Normal ovarian tissue is not identified. Findings are suspicious for malignancy. 2. Ascites. 3. Normal appearance of the uterus. 4. Primary or metastatic malignancy could have this appearance. 5. Normal endometrium.      01/28/2015 Tumor Marker    Patient's tumor was tested for the following markers: CA125. Results of the tumor marker test revealed 3142.      01/31/2015 Imaging    CT scan:  Large complex cystic and solid mass in the pelvis and lower abdomen measuring 27 cm. This could represent separate bilateral cystic adnexal masses or a single large cystic pelvic mass. This is consistent with cystic ovarian carcinoma. Diffuse peritoneal carcinomatosis with mild ascites. Mild lymphadenopathy in bilateral cardiophrenic angles. Moderate right pleural effusion and atelectasis. Indeterminate 6 mm right middle lobe pulmonary nodule. Recommend attention on follow-up CT. Acute left pulmonary embolism.       01/31/2015  Pathology Results    Endometrium, biopsy - PROLIFERATIVE/EARLY SECRETORY (INTERVAL PHASE) ENDOMETRIUM. - NO HYPERPLASIA OR MALIGNANCY      02/01/2015 Surgery    Successful ultrasound guided paracentesis yielding 800 mL of ascites      02/01/2015 Pathology Results    PERITONEAL/ASCITIC FLUID(SPECIMEN 1 OF 2 COLLECTED 02/01/15): MALIGNANT CELLS CONSISTENT WITH HIGH GRADE SEROUS CARCINOMA.      02/01/2015 Pathology Results    PLEURAL FLUID, RIGHT(SPECIMEN 2 OF 2 COLLECTED 02/01/15): MALIGNANT CELLS PRESENT CONSISTENT WITH HIGH GRADE CARCINOMA      02/01/2015 Procedure    Successful ultrasound guided right thoracentesis yielding 800 mL of pleural fluid      02/12/2015 - 06/24/2015 Chemotherapy    She received 6 cycles of carboplatin and Taxol      02/18/2015 Tumor Marker    Patient's tumor was tested for the following markers: CA125. Results of the tumor marker test revealed 713      02/28/2015 Genetic Testing    Patient has genetic testing done for BRCA mutation. Results revealed patient has the following mutation(s): BRCA 1 mutation      02/28/2015 Tumor Marker    Patient's tumor was tested for the following markers: CA125. Results of the tumor marker test revealed 3682      03/15/2015 Imaging    No free ascites identified in the pelvis to warrant paracentesis. The large loculated complex cystic and solid masses in the pelvis were again identified by ultrasound.      03/29/2015 Imaging    Ct abdomen:  1. The dominant cystic mass within the abdomen and pelvis is not significantly changed in size when compared with  previous exam. Decrease in peritoneal nodularity and soft tissue infiltration. Interval decrease in volume of abdominal and pelvic ascites. 2. There has been decrease in volume of right pleural effusion with resolution of right middle lobe lung nodule. 3. No new or progressive disease identified      04/16/2015 Pathology Results    1. Adnexa - ovary +/- tube,  neoplastic, right - HIGH GRADE CARCINOMA WITH SEROUS FEATURES INVOLVING OVARY, SEE COMMENT. - BENIGN FALLOPIAN TUBE; NEGATIVE FOR ATYPIA AND MALIGNANCY. 2. Adnexa - ovary +/- tube, neoplastic, left - HIGH GRADE CARCINOMA WITH SEROUS FEATURES INVOLVING OVARY, SEE COMMENT. - BENIGN FALLOPIAN TUBE; NEGATIVE FOR ATYPIA AND MALIGNANCY. 3. Uterus +/- tubes/ovaries, neoplastic, cervix - PROLIFERATIVE PHASE ENDOMETRIUM; NEGATIVE FOR HYPERPLASIA AND MALIGNANCY. - BENIGN CERVICAL MUCOSA; NEGATIVE FOR INTRAEPITHELIAL LESION OR MALIGNANCY. - UNREMARKABLE UTERINE SEROSA. - BENIGN PARA-UTERAL FIBROVASCULAR AND ADIPOSE SOFT TISSUE; NEGATIVE FOR ATYPIA AND MALIGNANCY. 4. Omentum, resection for tumor - POSITIVE FOR METASTATIC HIGH GRADE CARCINOMA, SEE COMMENT.  Representative section of tumor from part 2 demonstrates the following immunophenotype: cytokeratin 7 - strong diffuse expression. cytokeratin 20 - negative expression. estrogen receptor - patchy mild to moderate expression. p16 - negative expression. p63 - diffuse moderate strong expression. vimentin - strong diffuse expression. CDX2 - negative expression. WT-1 - patchy moderate strong expression. Overall the morphology and immunophenotype are that of high grade carcinoma with serous features (grade 4 serous carcinoma). Given that both ovaries demonstrate large masses of similar size, it is uncertain as to which of the two ovaries represent the primary site.   4. Representative sections from the submitted omental tissue demonstrate multiple foci of metastatic carcinoma associated with fibroinflammatory tissue reaction consistent with invasive omental implants.      04/16/2015 Surgery    Procedure(s) Performed: Exploratory laparotomy with total abdominal hysterectomy bilateral salpingo-oophorectomy, omentectomy radical tumor debulking for ovarian cancer Operative Findings: 20cm right ovarian cystic neoplasma, left ovary with tumor measuring  approximately 10cm. Tumors non-adherent to pelvic structures. No peritoneal nodularity or plaques, grossly normal omentum. No enlarged lymph nodes. Small volume (100cc) ascites. Normal diaphragms.  This represented an optimal cytoreduction (R0) with no gross visible disease remaining (in the peritoneal cavity).      05/31/2015 Tumor Marker    Patient's tumor was tested for the following markers: CA125. Results of the tumor marker test revealed 11.7      07/22/2015 Imaging    Ct abdomen:  Minimal residual peritoneal thickening in the pelvis, without evidence of discrete masses or ascites. This favors post treatment changes, although a minimal residual peritoneal metastatic disease cannot definitely be excluded. Recommend attention on follow-up imaging. No other sites of metastatic disease identified within the abdomen or pelvis. Incidentally noted abnormal IVC filter location within the right gonadal vein along the lateral wall of the IVC.       08/20/2015 Procedure    1. Successful fluoroscopic guided removal of infrarenal IVC filter.  2. Normal inferior venacavagram.       01/29/2016 Surgery    She underwent bilateral simple mastectomy      01/29/2016 Pathology Results    She has negative surgical pathology in her breast      04/08/2016 Surgery    She underwent bilateral breast reconstruction surgery      11/12/2016 Imaging    CT scan of abdomen:  Re- demonstrated minimal peritoneal thickening within the pelvis without discrete nodularity, potentially post treatment changes. Recommend attention on followup. No evidence for additional sites of metastatic disease within  the abdomen or pelvis.      11/12/2016 Tumor Marker    Patient's tumor was tested for the following markers: CA125. Results of the tumor marker test revealed 4.8      She feels well.  Denies bowel habits changes, abdominal bloating or pain. No chest pain or shortness of breath. She is active in all activities  of daily living. She has no concerns related to recent mastectomy and reconstruction surgery. She denies any recent abnormal breast examination, palpable mass, abnormal breast appearance or nipple changes   MEDICAL HISTORY:  Past Medical History:  Diagnosis Date  . Allergy   . Anxiety   . Cancer ([place])   . GERD (gastroesophageal reflux disease)   . Migraines   . Pneumonia    bronchialpneumonia circa 5732  . Pulmonary embolism [facility]) October 2016  . Strep throat     SURGICAL HISTORY: Past Surgical History:  Procedure Laterality Date  . ABDOMINAL HYSTERECTOMY Bilateral 04/16/2015   Procedure: HYSTERECTOMY ABDOMINAL TOTAL, EXPLORATORY LAPAROTOMY, BILATERAL SALPINGO OOPHERECTOMY, OMENTECTOMY, DEBULKING;  Surgeon: Everitt Amber, MD;  Location: [facility];  Service: Gynecology;  Laterality: Bilateral;  . RECONSTRUCTION BREAST IMMEDIATE / DELAYED W/ TISSUE EXPANDER    . SIMPLE MASTECTOMY    . WISDOM TOOTH EXTRACTION      SOCIAL HISTORY: Social History   Social History  . Marital status: Married    Spouse name: Coralyn Mark  . Number of children: 2  . Years of education: 16   Occupational History  . Self-employed    Social History Main Topics  . Smoking status: Former Smoker    Packs/day: 1.00    Years: 10.00    Types: Cigarettes    Quit date: 10/18/2002  . Smokeless tobacco: Never Used     Comment: smoked on and off for 10 yrs total - up to 1 ppd at most  . Alcohol use No     Comment: very rarely  . Drug use: No  . Sexual activity: Not Currently   Other Topics Concern  . Not on file   Social History Narrative   Fun: Go to [employer]; crochet   Denies religious beliefs effecting health care.     FAMILY HISTORY: Family History  Problem Relation Age of Onset  . Breast cancer Mother 51  . Healthy Father   . Breast cancer Maternal Grandmother        dx. 70 or younger  . Brain cancer Paternal Grandmother        unspecified tumor type  . HIV Brother   . Leukemia Paternal  Uncle   . HIV Brother   . Alzheimer's disease Maternal Grandfather   . Diabetes Maternal Grandfather   . Breast cancer Other   . Cancer Paternal Uncle        unspecified type    ALLERGIES:  is allergic to [place] [zolpidem tartrate]; compazine [prochlorperazine edisylate]; and tylox [oxycodone-acetaminophen].  MEDICATIONS:  Current Outpatient Prescriptions  Medication Sig Dispense Refill  . Acetaminophen 500 MG coapsule Take by mouth.    Marland Kitchen aspirin EC 81 MG tablet Take 81 mg by mouth daily.    . Cholecalciferol (VITAMIN D) 2000 UNITS tablet Take 4,000 Units by mouth daily.    Marland Kitchen estradiol (ESTRACE) 0.5 MG tablet Take 2 tablets (1 mg total) by mouth daily. 60 tablet 12  . hydrOXYzine (ATARAX/VISTARIL) 25 MG tablet Take 50 mg by mouth at bedtime.     Marland Kitchen LINZESS 145 MCG CAPS capsule Take 1 capsule by mouth  at bedtime as needed (help use the bathroom.).     Marland Kitchen LORazepam (ATIVAN) 1 MG tablet 1 SL or PO every 6 hours as needed for nausea. May use 1-2 tablets  HS as needed 30 tablet 0  . Magnesium 100 MG CAPS Take by mouth.    . Melatonin 5 MG TABS Take 5 mg by mouth.    . montelukast (SINGULAIR) 10 MG tablet Take 10 mg by mouth at bedtime.    . naproxen (NAPROSYN) 500 MG tablet TAKE 1 TABLET BY MOUTH EVERY 12 HOURS AS NEEDED FOR PAIN .TAKE WITH FOOD OR MILK  3  . pantoprazole (PROTONIX) 40 MG tablet Take 40 mg by mouth daily.    Marland Kitchen PREVIDENT 5000 BOOSTER PLUS 1.1 % PSTE Reported on 09/12/2015  5  . sennosides-docusate sodium (SENOKOT-S) 8.6-50 MG tablet Take 1 tablet by mouth at bedtime as needed for constipation.     . SUMAtriptan (IMITREX) 100 MG tablet Take 100 mg by mouth as needed for migraine. Reported on 11/11/2015    . triamcinolone (NASACORT) 55 MCG/ACT AERO nasal inhaler Place 1 spray into the nose daily as needed.    . triamcinolone cream (KENALOG) 0.1 % For exzema    . venlafaxine XR (EFFEXOR-XR) 150 MG 24 hr capsule TAKE 1 CAPSULE BY MOUTH EVERY DAY WITH FOOD  6   No current  facility-administered medications for this visit.     REVIEW OF SYSTEMS:   Constitutional: Denies fevers, chills or abnormal night sweats Eyes: Denies blurriness of vision, double vision or watery eyes Ears, nose, mouth, throat, and face: Denies mucositis or sore throat Respiratory: Denies cough, dyspnea or wheezes Cardiovascular: Denies palpitation, chest discomfort or lower extremity swelling Gastrointestinal:  Denies nausea, heartburn or change in bowel habits Skin: Denies abnormal skin rashes Lymphatics: Denies new lymphadenopathy or easy bruising Neurological:Denies numbness, tingling or new weaknesses Behavioral/Psych: Mood is stable, no new changes  All other systems were reviewed with the patient and are negative.  PHYSICAL EXAMINATION: ECOG PERFORMANCE STATUS: 0 - Asymptomatic  Vitals:   11/12/16 1136  BP: 110/60  Pulse: 81  Resp: 20  Temp: 98.2 F (36.8 C)   Filed Weights   11/12/16 1136  Weight: 158 lb 11.2 oz (72 kg)    GENERAL:alert, no distress and comfortable SKIN: skin color, texture, turgor are normal, no rashes or significant lesions EYES: normal, conjunctiva are pink and non-injected, sclera clear OROPHARYNX:no exudate, normal lips, buccal mucosa, and tongue  NECK: supple, thyroid normal size, non-tender, without nodularity LYMPH:  no palpable lymphadenopathy in the cervical, axillary or inguinal LUNGS: clear to auscultation and percussion with normal breathing effort HEART: regular rate & rhythm and no murmurs without lower extremity edema ABDOMEN:abdomen soft, non-tender and normal bowel sounds Musculoskeletal:no cyanosis of digits and no clubbing  PSYCH: alert & oriented x 3 with fluent speech NEURO: no focal motor/sensory deficits Chest wall examination revealed well-healed bilateral mastectomy scars and reconstruction surgery with implants in situ with no other abnormalities  LABORATORY DATA:  I have reviewed the data as listed Lab Results   Component Value Date   WBC 3.9 11/12/2016   HGB 13.0 11/12/2016   HCT 40.2 11/12/2016   MCV 91.6 11/12/2016   PLT 184 11/12/2016    Recent Labs  02/24/16 1302 05/14/16 0853 11/12/16 1022  NA 142 140 139  K 3.6 3.8 4.2  CO2 '28 27 27  ' GLUCOSE 88 70 85  BUN 10.4 13.0 10.6  CREATININE 0.9 1.0 1.0  CALCIUM  9.2 9.5 8.9  PROT 6.7 7.3 6.4  ALBUMIN 3.4* 3.9 3.6  AST '14 16 13  ' ALT '12 10 11  ' ALKPHOS 78 82 69  BILITOT <0.22 0.35 0.30    RADIOGRAPHIC STUDIES: I have personally reviewed the radiological images as listed and agreed with the findings in the report. Ct Abdomen Pelvis W Contrast  Result Date: 11/12/2016 CLINICAL DATA:  Patient with history of ovarian carcinoma diagnosed 01/2015. Patient completed chemotherapy 07/2015. EXAM: CT ABDOMEN AND PELVIS WITH CONTRAST TECHNIQUE: Multidetector CT imaging of the abdomen and pelvis was performed using the standard protocol following bolus administration of intravenous contrast. CONTRAST:  130m ISOVUE-300 IOPAMIDOL (ISOVUE-300) INJECTION 61% COMPARISON:  CT abdomen pelvis 07/22/2015 FINDINGS: Lower chest: Normal heart size. No dependent atelectasis within the bilateral lower lobes. No pleural effusion. Hepatobiliary: The liver is normal in size and contour. No focal hepatic lesion is identified. Gallbladder is unremarkable. No intrahepatic or extrahepatic biliary ductal dilatation. Pancreas: Unremarkable Spleen: Unremarkable Adrenals/Urinary Tract: Adrenal glands are normal. Kidneys enhance symmetrically with contrast. No hydronephrosis. Urinary bladder is unremarkable. Stomach/Bowel: Large amount of stool throughout the colon. No abnormal bowel wall thickening or evidence for bowel obstruction. No free fluid or free intraperitoneal air. Normal morphology of the stomach. Normal appendix. Vascular/Lymphatic: Normal caliber abdominal aorta. No retroperitoneal lymphadenopathy. Interval removal IVC filter. Reproductive: Patient status post TAHBSO.  Stable minimal fluid/ peritoneal thickening within the pelvis. No discrete nodularity is identified (image 63; series 2). Other: None. Musculoskeletal: Bilateral L5 pars defects with persistent grade 2 anterolisthesis of L5 on S1 and associated degenerative disc disease at this level. IMPRESSION: Re- demonstrated minimal peritoneal thickening within the pelvis without discrete nodularity, potentially post treatment changes. Recommend attention on followup. No evidence for additional sites of metastatic disease within the abdomen or pelvis. Electronically Signed   By: DLovey NewcomerM.D.   On: 11/12/2016 10:31    ASSESSMENT & PLAN:  [place]ically, she had no signs of cancer recurrence Tumor markers are within normal limits. She will continue on GOG study She was sees GYN oncologist in approximately 3 months and I will see her back in 6 months  BRCA1 gene mutation positive The patient has completed bilateral mastectomy as primary prevention against risk of breast cancer. Outside surgical pathology was negative for malignancy Examination on her chest wall is benign We have extensive discussion about the role of genetic testing and screening of her children and other family members. BRCA mutation is a genetic predisposition which increases an individual's risk for malignancy.  We discussed the implications of genetic screening including the possibility of uninsurability, costs involved, emotional distress and possible discrimination at various levels for the affected individual.  The patient told me that her children are aware of her diagnosis and they are not keen to pursue genetic testing at this point. I felt that one can possibly benefit from genetic counseling if her children are emotionally ready to pursue further evaluation.   S/P mastectomy, bilateral Chest wall examination is benign.  She does not need further mammogram   Orders Placed This Encounter   Procedures  . CT ABDOMEN PELVIS W CONTRAST    Standing Status:   Future    Standing Expiration Date:   12/17/2017    Order Specific Question:   Reason for exam:    Answer:   staging ovarian ca    Order Specific Question:   Is the patient pregnant?    Answer:   No  Order Specific Question:   Preferred imaging location?    Answer:   [facility]  . CBC with Differential/Platelet    Standing Status:   Standing    Number of Occurrences:   20    Standing Expiration Date:   11/12/2017  . Comprehensive metabolic panel    Standing Status:   Standing    Number of Occurrences:   22    Standing Expiration Date:   11/12/2017  . CA 125    Standing Status:   Standing    Number of Occurrences:   22    Standing Expiration Date:   11/12/2017    All questions were answered. The patient knows to call the clinic with any problems, questions or concerns. I spent 40 minutes counseling the patient face to face. The total time spent in the appointment was 55 minutes and more than 50% was on counseling.     Heath Lark, MD 11/14/2016 9:05 AM

## 2016-11-14 NOTE — Assessment & Plan Note (Signed)
The patient has completed bilateral mastectomy as primary prevention against risk of breast cancer. Outside surgical pathology was negative for malignancy Examination on her chest wall is benign We have extensive discussion about the role of genetic testing and screening of her children and other family members. BRCA mutation is a genetic predisposition which increases an individual's risk for malignancy.  We discussed the implications of genetic screening including the possibility of uninsurability, costs involved, emotional distress and possible discrimination at various levels for the affected individual.  The patient told me that her children are aware of her diagnosis and they are not keen to pursue genetic testing at this point. I felt that one can possibly benefit from genetic counseling if her children are emotionally ready to pursue further evaluation.

## 2016-11-16 ENCOUNTER — Telehealth: Payer: Self-pay | Admitting: *Deleted

## 2016-11-16 NOTE — Telephone Encounter (Signed)
Per staff message attempted to contact the patient for a follow up appt. Left message for the patient to call the office back.

## 2016-11-17 ENCOUNTER — Telehealth: Payer: Self-pay | Admitting: *Deleted

## 2016-11-17 NOTE — Telephone Encounter (Signed)
Contacted the patient and scheduled an appt for follow in October. Patient given appt for October 15th.

## 2016-12-02 ENCOUNTER — Encounter: Payer: Self-pay | Admitting: Hematology and Oncology

## 2016-12-11 ENCOUNTER — Telehealth: Payer: Self-pay | Admitting: Hematology and Oncology

## 2016-12-11 NOTE — Telephone Encounter (Signed)
Scheduled appt per 8/10 sch message.

## 2016-12-11 NOTE — Telephone Encounter (Signed)
Patient aware of appt added per 8/10 sch message

## 2016-12-14 ENCOUNTER — Other Ambulatory Visit: Payer: Self-pay | Admitting: Gynecologic Oncology

## 2016-12-14 ENCOUNTER — Encounter: Payer: Self-pay | Admitting: Gynecologic Oncology

## 2016-12-14 DIAGNOSIS — M79604 Pain in right leg: Secondary | ICD-10-CM

## 2016-12-15 ENCOUNTER — Inpatient Hospital Stay (HOSPITAL_COMMUNITY): Admission: RE | Admit: 2016-12-15 | Payer: BLUE CROSS/BLUE SHIELD | Source: Ambulatory Visit

## 2016-12-15 ENCOUNTER — Encounter: Payer: Self-pay | Admitting: Gynecologic Oncology

## 2016-12-16 ENCOUNTER — Ambulatory Visit (HOSPITAL_COMMUNITY)
Admission: RE | Admit: 2016-12-16 | Discharge: 2016-12-16 | Disposition: A | Discharge status: Home / Self Care | Payer: BLUE CROSS/BLUE SHIELD | Source: Ambulatory Visit | Attending: Gynecologic Oncology | Admitting: Gynecologic Oncology

## 2016-12-16 DIAGNOSIS — M79604 Pain in right leg: Secondary | ICD-10-CM | POA: Diagnosis not present

## 2016-12-16 NOTE — Progress Notes (Signed)
VASCULAR LAB PRELIMINARY  PRELIMINARY  PRELIMINARY  PRELIMINARY  Right lower extremity venous duplex completed.    Preliminary report:  Right:  No evidence of DVT, superficial thrombosis, or Baker's cyst.  Thurley Francesconi, RVS 12/16/2016, 12:09 PM

## 2017-01-12 ENCOUNTER — Other Ambulatory Visit: Payer: Self-pay | Admitting: Gynecologic Oncology

## 2017-01-12 DIAGNOSIS — E2839 Other primary ovarian failure: Secondary | ICD-10-CM

## 2017-02-02 DIAGNOSIS — F419 Anxiety disorder, unspecified: Secondary | ICD-10-CM | POA: Diagnosis not present

## 2017-02-02 DIAGNOSIS — M25511 Pain in right shoulder: Secondary | ICD-10-CM | POA: Diagnosis not present

## 2017-02-02 DIAGNOSIS — G43009 Migraine without aura, not intractable, without status migrainosus: Secondary | ICD-10-CM | POA: Diagnosis not present

## 2017-02-02 DIAGNOSIS — Z13220 Encounter for screening for lipoid disorders: Secondary | ICD-10-CM | POA: Diagnosis not present

## 2017-02-02 DIAGNOSIS — Z23 Encounter for immunization: Secondary | ICD-10-CM | POA: Diagnosis not present

## 2017-02-15 ENCOUNTER — Encounter: Payer: Self-pay | Admitting: Gynecologic Oncology

## 2017-02-15 ENCOUNTER — Encounter: Payer: Self-pay | Admitting: *Deleted

## 2017-02-15 ENCOUNTER — Ambulatory Visit: Payer: BLUE CROSS/BLUE SHIELD | Attending: Gynecologic Oncology | Admitting: Gynecologic Oncology

## 2017-02-15 ENCOUNTER — Other Ambulatory Visit (HOSPITAL_BASED_OUTPATIENT_CLINIC_OR_DEPARTMENT_OTHER): Payer: BLUE CROSS/BLUE SHIELD

## 2017-02-15 VITALS — BP 117/78 | HR 72 | Temp 98.0°F | Resp 18 | Wt 149.9 lb

## 2017-02-15 DIAGNOSIS — Z86711 Personal history of pulmonary embolism: Secondary | ICD-10-CM | POA: Insufficient documentation

## 2017-02-15 DIAGNOSIS — Z888 Allergy status to other drugs, medicaments and biological substances status: Secondary | ICD-10-CM | POA: Insufficient documentation

## 2017-02-15 DIAGNOSIS — Z803 Family history of malignant neoplasm of breast: Secondary | ICD-10-CM | POA: Diagnosis not present

## 2017-02-15 DIAGNOSIS — K5900 Constipation, unspecified: Secondary | ICD-10-CM | POA: Diagnosis not present

## 2017-02-15 DIAGNOSIS — Z9010 Acquired absence of unspecified breast and nipple: Secondary | ICD-10-CM | POA: Insufficient documentation

## 2017-02-15 DIAGNOSIS — Z831 Family history of other infectious and parasitic diseases: Secondary | ICD-10-CM | POA: Insufficient documentation

## 2017-02-15 DIAGNOSIS — Z79899 Other long term (current) drug therapy: Secondary | ICD-10-CM | POA: Diagnosis not present

## 2017-02-15 DIAGNOSIS — C561 Malignant neoplasm of right ovary: Secondary | ICD-10-CM | POA: Diagnosis not present

## 2017-02-15 DIAGNOSIS — Z006 Encounter for examination for normal comparison and control in clinical research program: Secondary | ICD-10-CM | POA: Diagnosis not present

## 2017-02-15 DIAGNOSIS — Z90710 Acquired absence of both cervix and uterus: Secondary | ICD-10-CM | POA: Diagnosis not present

## 2017-02-15 DIAGNOSIS — Z87891 Personal history of nicotine dependence: Secondary | ICD-10-CM | POA: Diagnosis not present

## 2017-02-15 DIAGNOSIS — Z808 Family history of malignant neoplasm of other organs or systems: Secondary | ICD-10-CM | POA: Insufficient documentation

## 2017-02-15 DIAGNOSIS — Z806 Family history of leukemia: Secondary | ICD-10-CM | POA: Insufficient documentation

## 2017-02-15 DIAGNOSIS — Z791 Long term (current) use of non-steroidal anti-inflammatories (NSAID): Secondary | ICD-10-CM | POA: Insufficient documentation

## 2017-02-15 DIAGNOSIS — Z833 Family history of diabetes mellitus: Secondary | ICD-10-CM | POA: Insufficient documentation

## 2017-02-15 DIAGNOSIS — Z9889 Other specified postprocedural states: Secondary | ICD-10-CM | POA: Insufficient documentation

## 2017-02-15 DIAGNOSIS — Z853 Personal history of malignant neoplasm of breast: Secondary | ICD-10-CM | POA: Diagnosis not present

## 2017-02-15 DIAGNOSIS — C563 Malignant neoplasm of bilateral ovaries: Secondary | ICD-10-CM

## 2017-02-15 DIAGNOSIS — Z1501 Genetic susceptibility to malignant neoplasm of breast: Secondary | ICD-10-CM

## 2017-02-15 DIAGNOSIS — C562 Malignant neoplasm of left ovary: Secondary | ICD-10-CM | POA: Diagnosis not present

## 2017-02-15 DIAGNOSIS — Z7982 Long term (current) use of aspirin: Secondary | ICD-10-CM | POA: Insufficient documentation

## 2017-02-15 DIAGNOSIS — Z9221 Personal history of antineoplastic chemotherapy: Secondary | ICD-10-CM

## 2017-02-15 DIAGNOSIS — Z8543 Personal history of malignant neoplasm of ovary: Secondary | ICD-10-CM | POA: Diagnosis not present

## 2017-02-15 DIAGNOSIS — Z832 Family history of diseases of the blood and blood-forming organs and certain disorders involving the immune mechanism: Secondary | ICD-10-CM | POA: Insufficient documentation

## 2017-02-15 DIAGNOSIS — Z90722 Acquired absence of ovaries, bilateral: Secondary | ICD-10-CM

## 2017-02-15 DIAGNOSIS — K219 Gastro-esophageal reflux disease without esophagitis: Secondary | ICD-10-CM | POA: Diagnosis not present

## 2017-02-15 DIAGNOSIS — F419 Anxiety disorder, unspecified: Secondary | ICD-10-CM | POA: Diagnosis not present

## 2017-02-15 DIAGNOSIS — C569 Malignant neoplasm of unspecified ovary: Secondary | ICD-10-CM

## 2017-02-15 LAB — COMPREHENSIVE METABOLIC PANEL WITH GFR
ALT: 11 U/L (ref 0–55)
AST: 15 U/L (ref 5–34)
Albumin: 3.6 g/dL (ref 3.5–5.0)
Alkaline Phosphatase: 65 U/L (ref 40–150)
Anion Gap: 7 meq/L (ref 3–11)
BUN: 7.3 mg/dL (ref 7.0–26.0)
CO2: 28 meq/L (ref 22–29)
Calcium: 9 mg/dL (ref 8.4–10.4)
Chloride: 105 meq/L (ref 98–109)
Creatinine: 1 mg/dL (ref 0.6–1.1)
EGFR: >60 mL/min/{1.73_m2}
Glucose: 92 mg/dL (ref 70–140)
Potassium: 3.9 meq/L (ref 3.5–5.1)
Sodium: 139 meq/L (ref 136–145)
Total Bilirubin: 0.32 mg/dL (ref 0.20–1.20)
Total Protein: 6.7 g/dL (ref 6.4–8.3)

## 2017-02-15 LAB — CBC WITH DIFFERENTIAL/PLATELET
BASO%: 0.8 % (ref 0.0–2.0)
Basophils Absolute: 0 10*3/uL (ref 0.0–0.1)
EOS%: 1 % (ref 0.0–7.0)
Eosinophils Absolute: 0 10*3/uL (ref 0.0–0.5)
HEMATOCRIT: 39.2 % (ref 34.8–46.6)
HEMOGLOBIN: 13 g/dL (ref 11.6–15.9)
LYMPH#: 1.5 10*3/uL (ref 0.9–3.3)
LYMPH%: 38.9 % (ref 14.0–49.7)
MCH: 29.4 pg (ref 25.1–34.0)
MCHC: 33.2 g/dL (ref 31.5–36.0)
MCV: 88.8 fL (ref 79.5–101.0)
MONO#: 0.3 10*3/uL (ref 0.1–0.9)
MONO%: 7.4 % (ref 0.0–14.0)
NEUT%: 51.9 % (ref 38.4–76.8)
NEUTROS ABS: 2 10*3/uL (ref 1.5–6.5)
PLATELETS: 201 10*3/uL (ref 145–400)
RBC: 4.41 10*6/uL (ref 3.70–5.45)
RDW: 12.9 % (ref 11.2–14.5)
WBC: 3.9 10*3/uL (ref 3.9–10.3)

## 2017-02-15 NOTE — Progress Notes (Signed)
Consult Note: Gyn-Onc  Consult was requested by Dr. Kennon Rounds for the evaluation of Elizabeth Henry 46 y.o. female   CC:  Chief Complaint  Patient presents with  . Ovarian cancer, bilateral [facility])    Assessment/Plan:  Ms. Elizabeth Henry  is a 46 y.o.  year old with stage IV ovarian cancer in the setting of acute PE, s/p 3 cycles of neoadjuvant chemotherapy s/p ex lap, TAH, BSO, omentectomy, interval debulking to no macroscopic residual disease on 05/02/15. 3 additional cycles of chemotherapy with carboplatin and paclitaxel completed on 06/24/15.  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  BRCA 1 deleterious mutation positive. s/p risk reducing mastectomy.  She has had a complete response to therapy with no evidence of recurrence.  Follow-up with me in 6 months (approximately 4/19). CT scan and appointment with Dr Alvy Bimler in 3 months.  Counseled regarding symptoms of recurrence and to return to see me if these develop.    HPI: Elizabeth Henry is a very pleasant 46 year old G2P2 who was initially seen in consultation at the request of Dr Kennon Rounds for bilateral  Ovarian masses, ascites, elevated CA-125, and abnormal uterine bleeding. The patient began experiencing abdominal bloating and early satiety 6 weeks ago in July 2016. She since the end of August 2016 she has experienced vaginal bleeding every day that is light. She denies pain other than stretching pain in her abdomen. She's had some constipation but no change in the caliber of his stools and no blood in her bowel movements.  She was evaluated by Dr. Kennon Rounds on 01/25/2015. A pelvic and abdominal ultrasound scan were ordered and revealed large volume of ascites in the abdomen, bilateral ovarian masses with the right measuring 24.3 x 13.1 x 25.3 cm with solid and cystic components and blood flow within the solid components. The left ovary measured 14.5 x 12.7 x 14.8 cm and containing solid  cystic components with increased blood flow. The endometrial stripe was slightly thickened at 9 mm (cc patient is premenopausal). A CA-125 was drawn on 01/28/2015 and was elevated at 3142 units per milliliter.  A CT chest was performed on 01/31/15 which showed an acute PE and pleural effusions.  She was started on therapeutic lovenox.  She was started on neoadjuvant chemotherapy (due to her acute PE and the concern about discontinuing anticoagulation for surgery in the acute setting).  Prior to starting chemotherapy she underwent paracentesis and thoracentesis (right) both of which confirmed metastatic adencarcinoma consistent with gyn primary.  On 02/12/15 she began neoadjuvant chemotherapy with 3 cycles of paclitaxel and carboplatin.  She underwent BRCA deleterious testing which was positive for a BRCA 1 mutation.  CT on 03/29/15 showed interval resolution of ascites and significant improvement in upper abdominal disease with persistent ovarian cysts.  On 04/16/15 she underwent an ex lap, TAH, BSO, omentectomy and radical tumor debulking to no gross residual disease. She tolerated surgery well.  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  She went on to complete 3 additional adjuvant chemotherapy cycles with carboplatin and paclitaxel. She tolerated treatment well with no concerns. Last cycle of treatment was 06/24/15.  CT abdo/pelvis and chest on 07/22/15 showed no measurable residual disease. The IVC filter appeared to have migrated to the ovarian vessel.  She received a risk reducing mastectomy in September, 2017 at [place].  Interval Hx:  Last CA 125 normal on 08/05/16 was normal and  stable at 4.9 (stable from 5)  No bloating, pain, early satiety, edema, cough.   CT scan (as part of trial protocol) in July, 2018 was negative for evidence of disease recurrence (stable pelvic peritoneal  thickening).  Performance status is ECOG 0 - fully active.  Adverse event assessment negative for adverse events.  Current Meds:  Outpatient Encounter Prescriptions as of 02/15/2017  Medication Sig  . Acetaminophen 500 MG coapsule Take by mouth.  Marland Kitchen aspirin EC 81 MG tablet Take 81 mg by mouth daily.  . Cholecalciferol (VITAMIN D) 2000 UNITS tablet Take 4,000 Units by mouth daily.  Marland Kitchen estradiol (ESTRACE) 0.5 MG tablet TAKE 2 TABLETS (1 MG TOTAL) BY MOUTH DAILY.  . hydrOXYzine (ATARAX/VISTARIL) 25 MG tablet Take 50 mg by mouth at bedtime.   Marland Kitchen LINZESS 145 MCG CAPS capsule Take 1 capsule by mouth at bedtime as needed (help use the bathroom.).   Marland Kitchen LORazepam (ATIVAN) 1 MG tablet 1 SL or PO every 6 hours as needed for nausea. May use 1-2 tablets  HS as needed  . Magnesium 100 MG CAPS Take by mouth.  . Melatonin 5 MG TABS Take 5 mg by mouth.  . montelukast (SINGULAIR) 10 MG tablet Take 10 mg by mouth at bedtime.  . naproxen (NAPROSYN) 500 MG tablet TAKE 1 TABLET BY MOUTH EVERY 12 HOURS AS NEEDED FOR PAIN .TAKE WITH FOOD OR MILK  . pantoprazole (PROTONIX) 40 MG tablet Take 40 mg by mouth daily.  Marland Kitchen PREVIDENT 5000 BOOSTER PLUS 1.1 % PSTE Reported on 09/12/2015  . sennosides-docusate sodium (SENOKOT-S) 8.6-50 MG tablet Take 1 tablet by mouth at bedtime as needed for constipation.   . SUMAtriptan (IMITREX) 100 MG tablet Take 100 mg by mouth as needed for migraine. Reported on 11/11/2015  . triamcinolone (NASACORT) 55 MCG/ACT AERO nasal inhaler Place 1 spray into the nose daily as needed.  . triamcinolone cream (KENALOG) 0.1 % For exzema  . venlafaxine XR (EFFEXOR-XR) 150 MG 24 hr capsule TAKE 1 CAPSULE BY MOUTH EVERY DAY WITH FOOD   No facility-administered encounter medications on file as of 02/15/2017.     Allergy:  Allergies  Allergen Reactions  . Ambien [Zolpidem Tartrate] Swelling  . Compazine [Prochlorperazine Edisylate] Hives  . Tylox [Oxycodone-Acetaminophen] Hives    Social Hx:    Social History   Social History  . Marital status: Married    Spouse name: Coralyn Mark  . Number of children: 2  . Years of education: 16   Occupational History  . Self-employed    Social History Main Topics  . Smoking status: Former Smoker    Packs/day: 1.00    Years: 10.00    Types: Cigarettes    Quit date: 10/18/2002  . Smokeless tobacco: Never Used     Comment: smoked on and off for 10 yrs total - up to 1 ppd at most  . Alcohol use No     Comment: very rarely  . Drug use: No  . Sexual activity: Not Currently   Other Topics Concern  . Not on file   Social History Narrative   Fun: Go to [employer]; crochet   Denies religious beliefs effecting health care.     Past Surgical Hx:  Past Surgical History:  Procedure Laterality Date  . ABDOMINAL HYSTERECTOMY Bilateral 04/16/2015   Procedure: HYSTERECTOMY ABDOMINAL TOTAL, EXPLORATORY LAPAROTOMY, BILATERAL SALPINGO OOPHERECTOMY, OMENTECTOMY, DEBULKING;  Surgeon: Everitt Amber, MD;  Location: [facility];  Service: Gynecology;  Laterality: Bilateral;  . RECONSTRUCTION BREAST IMMEDIATE /  DELAYED W/ TISSUE EXPANDER    . SIMPLE MASTECTOMY    . WISDOM TOOTH EXTRACTION      Past Medical Hx:  Past Medical History:  Diagnosis Date  . Allergy   . Anxiety   . Cancer ([place])   . GERD (gastroesophageal reflux disease)   . Migraines   . Pneumonia    bronchialpneumonia circa 9702  . Pulmonary embolism [facility]) October 2016  . Strep throat     Past Gynecological History:  Remote hx of abn paps.  Patient's last menstrual period was 01/08/2015 (approximate).  Family Hx:  Family History  Problem Relation Age of Onset  . Breast cancer Mother 4  . Healthy Father   . Breast cancer Maternal Grandmother        dx. 72 or younger  . Brain cancer Paternal Grandmother        unspecified tumor type  . HIV Brother   . Leukemia Paternal Uncle   . HIV Brother   . Alzheimer's disease Maternal Grandfather   . Diabetes Maternal Grandfather   .  Breast cancer Other   . Cancer Paternal Uncle        unspecified type    Review of Systems:  Constitutional  Feels distended  ENT Normal appearing ears and nares bilaterally Skin/Breast  No rash, sores, jaundice, itching, dryness Cardiovascular  No chest pain, shortness of breath, or edema  Pulmonary  No cough or wheeze.  Gastro Intestinal  No nausea, vomitting, or diarrhoea.  Genito Urinary  No frequency, urgency, dysuria, no vaginal bleeding Musculo Skeletal  No myalgia, arthralgia, joint swelling or pain  Neurologic  No weakness, numbness, change in gait,  Psychology  No depression, anxiety, insomnia.   Vitals:  Blood pressure 117/78, pulse 72, temperature 98 F (36.7 C), temperature source Oral, resp. rate 18, weight 149 lb 14.4 oz (68 kg), last menstrual period 01/08/2015, SpO2 99 %.  Physical Exam: WD in NAD Neck  Supple NROM, without any enlargements.  Lymph Node Survey No cervical supraclavicular or inguinal adenopathy Cardiovascular  Pulse normal rate, regularity and rhythm. S1 and S2 normal.  Lungs  Clear to auscultation bilateraly, without wheezes/crackles/rhonchi. Good air movement.  Skin  No rash/lesions/breakdown  Psychiatry  Alert and oriented to person, place, and time  Abdomen  Normoactive bowel sounds, abdomen non distended with well healed incision. No masses. Back No CVA tenderness Genito Urinary  Vulva/vagina: Normal external female genitalia.   No lesions. No discharge or bleeding.  Bladder/urethra:  No lesions or masses, well supported bladder  Vagina: cuff healing normally, in tact, no masses  Cervix and uterus: surgically absent  Adnexa: no masses. Rectal  Good tone, no masses no cul de sac nodularity.  Extremities  No bilateral cyanosis, clubbing or edema.   Donaciano Eva, MD  02/15/2017, 3:54 PM

## 2017-02-15 NOTE — Patient Instructions (Signed)
Follow up with Dr. Alvy Bimler on 05-06-17 as scheduled. You will have a CA-125 drawn on 05-05-17 as well as Dr. Calton Dach labs. Follow up with Dr. Denman George in April 2019.  Call in November 2018 to schedule that appointment.

## 2017-02-15 NOTE — Progress Notes (Signed)
02/15/17 Patient in to clinic today for follow up visit with Dr. Denman George and Month 18 evaluation of GOG-225 study. Blood for standard of care labs and CA-125 drawn in lab.  Weight was obtained without shoes, in lightweight indoor clothing.  Waist measurement was obtained according to protocol section 4.6.1. Physical exam was performed by Dr. Denman George.   Foye Spurling, BSN, RN Clinical Research Nurse 02/15/2017 3:50 PM   See Consent Form encounter created 02/24/17 for documentation of patient notified of changes to consent form.   Foye Spurling, BSN, RN Clinical Research Nurse 02/24/2017 12:01 PM

## 2017-02-16 LAB — CA 125: Cancer Antigen (CA) 125: 5.6 U/mL (ref 0.0–38.1)

## 2017-02-17 ENCOUNTER — Encounter: Payer: Self-pay | Admitting: Gynecologic Oncology

## 2017-02-24 ENCOUNTER — Encounter: Payer: Self-pay | Admitting: *Deleted

## 2017-03-09 NOTE — Progress Notes (Signed)
03/09/2017 See Research nurse note by Foye Spurling for details of Month 15 visit on 02/15/2017.  Adverse Event Log Study/Protocol: GOG 0225 Visit Month 15: 11/12/2016 - 02/15/2017 Event Grade Attribution to Protocol Treatment (Lifestyle Intervention)  Shoulder pain 2 Unrelated  Hip pain 2 Unrelated  Right leg pain 2 Unrelated  Cindy S. Brigitte Pulse BSN, RN, CCRP 03/09/2017 11:20 AM

## 2017-04-30 IMAGING — CT CT CHEST W/ CM
1 of 3 series · 14 of 30 positions shown, 18 images · IV contrast (OMNIPAQUE)
Comparison: None.

CLINICAL DATA: Ovarian mass. Ascites. Elevated CA 125 level.
Dyspnea.

EXAM:
CT CHEST, ABDOMEN, AND PELVIS WITH CONTRAST
TECHNIQUE: Multidetector CT imaging of the chest, abdomen and pelvis was
performed following the standard protocol during bolus
administration of intravenous contrast.
CONTRAST:  50mL OMNIPAQUE IOHEXOL 300 MG/ML SOLN, 100mL OMNIPAQUE
IOHEXOL 300 MG/ML SOLN

[Series 2: cap with st · axial · 0.83mm/px · z∈[+600,+1154]mm · 14 of 129 slices shown, 18 images]
[im 9/129  mediastinal]
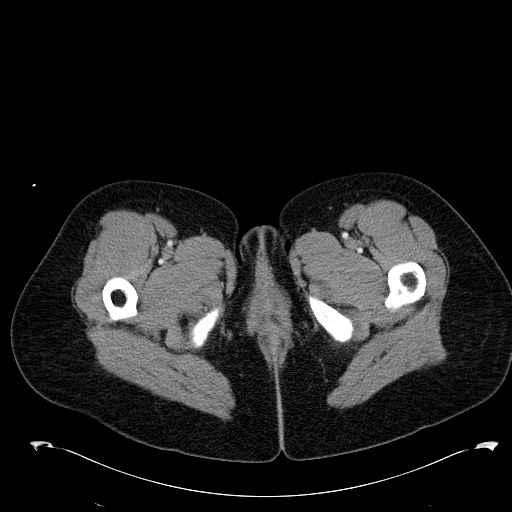
[im 9/129  lung]
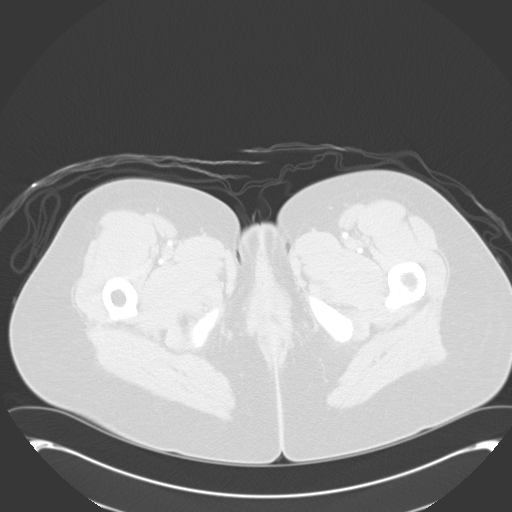
[im 18/129  lung]
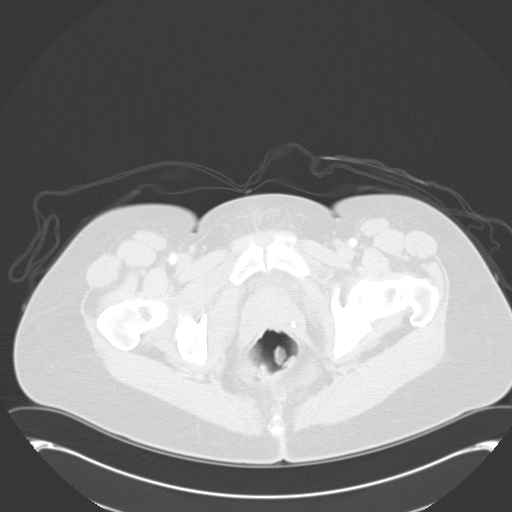
[im 26/129  lung]
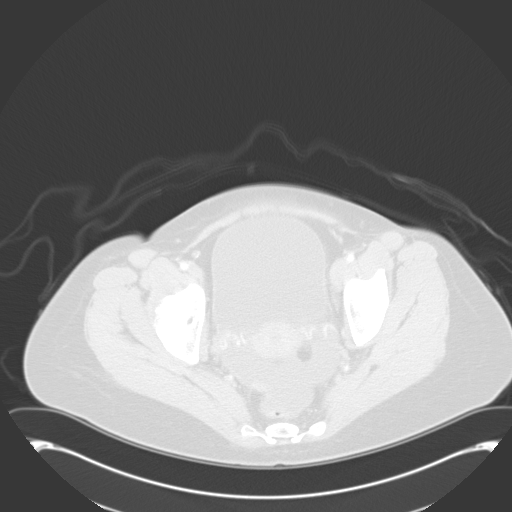
[im 35/129  lung]
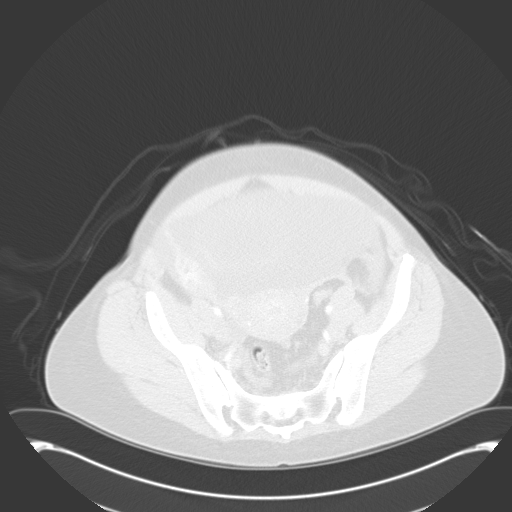
[im 43/129  mediastinal]
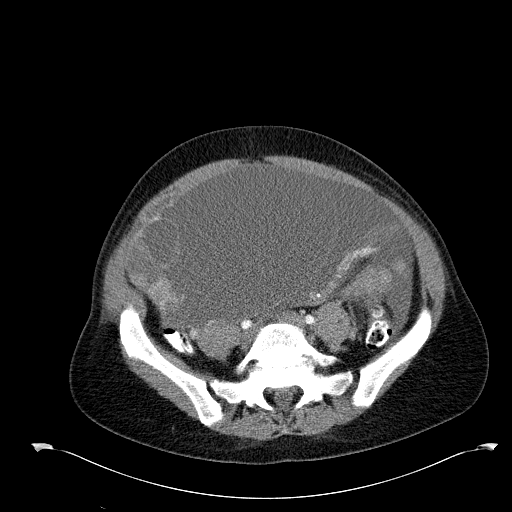
[im 43/129  lung]
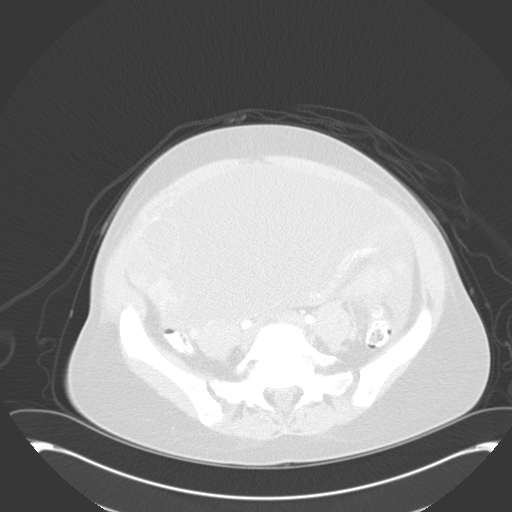
[im 52/129  lung]
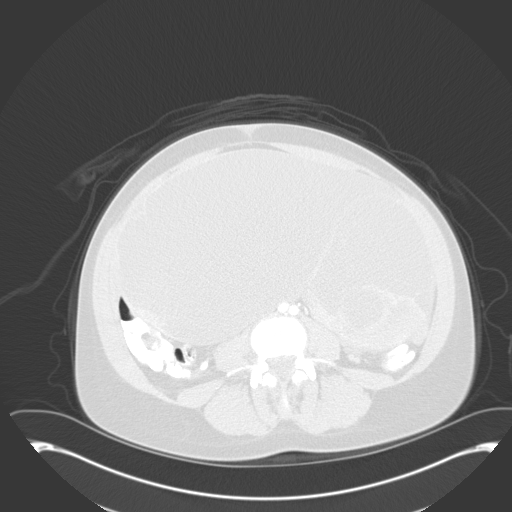
[im 60/129  lung]
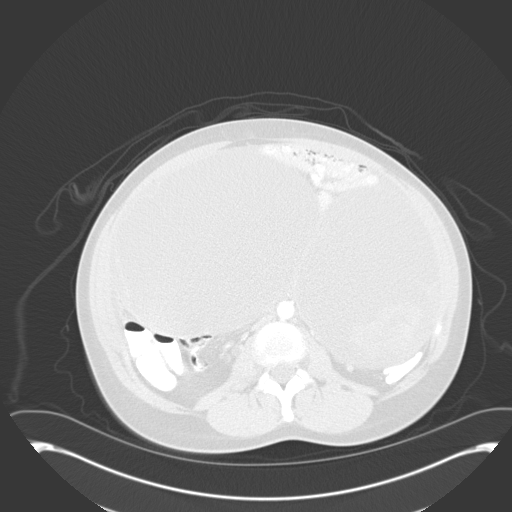
[im 69/129  lung]
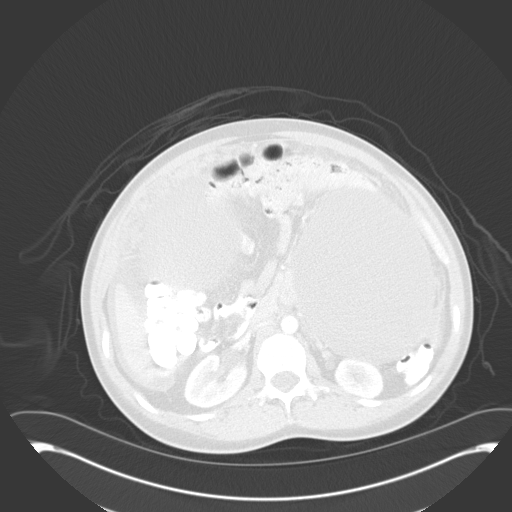
[im 77/129  mediastinal]
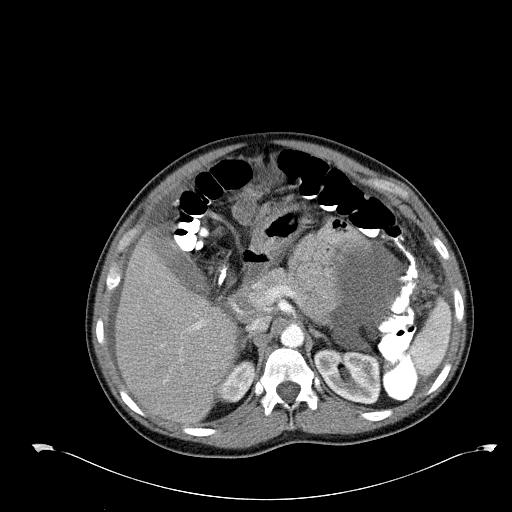
[im 77/129  lung]
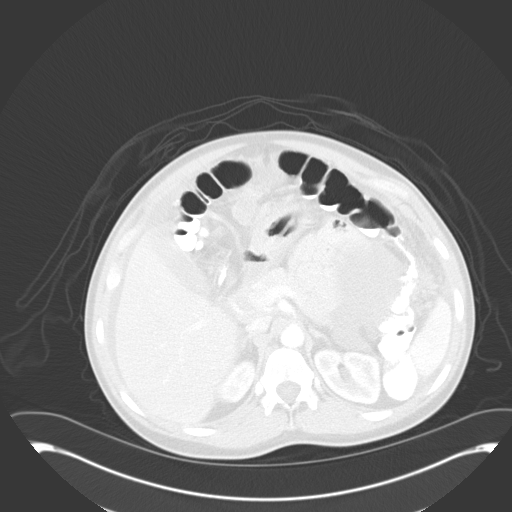
[im 86/129  lung]
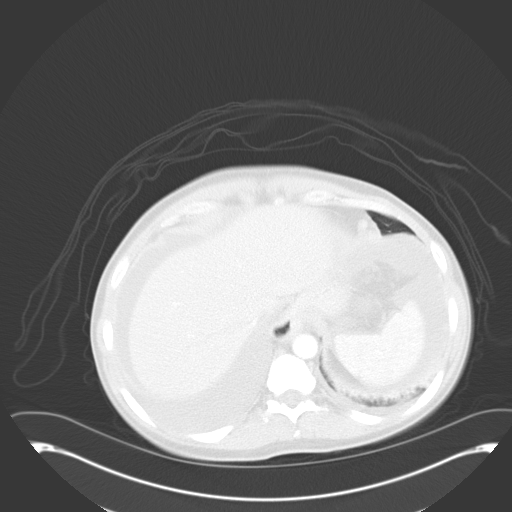
[im 94/129  lung]
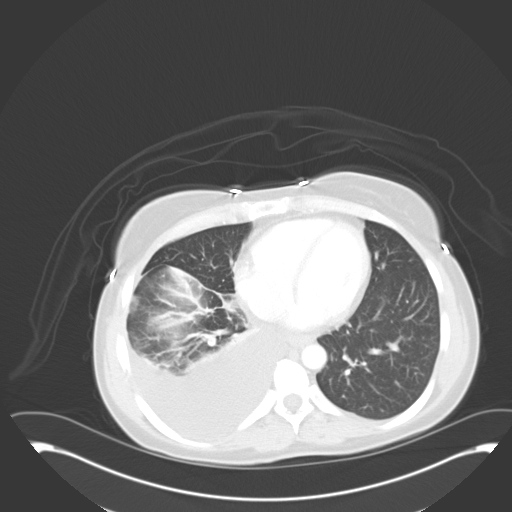
[im 103/129  lung]
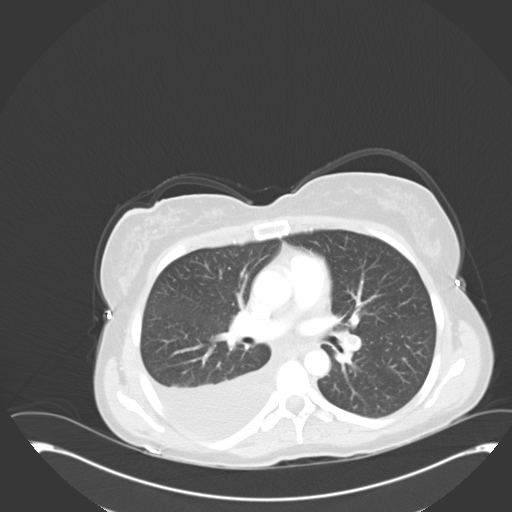
[im 111/129  mediastinal]
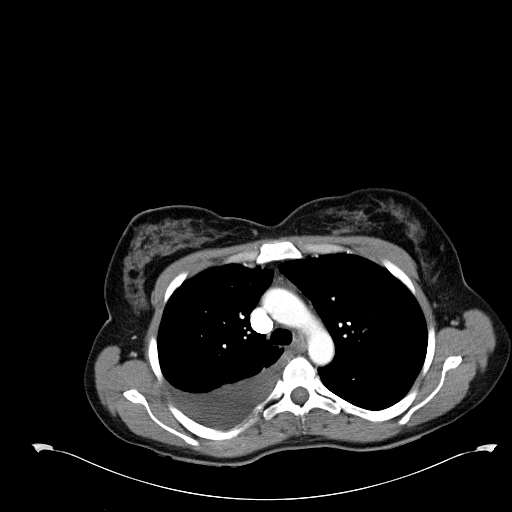
[im 111/129  lung]
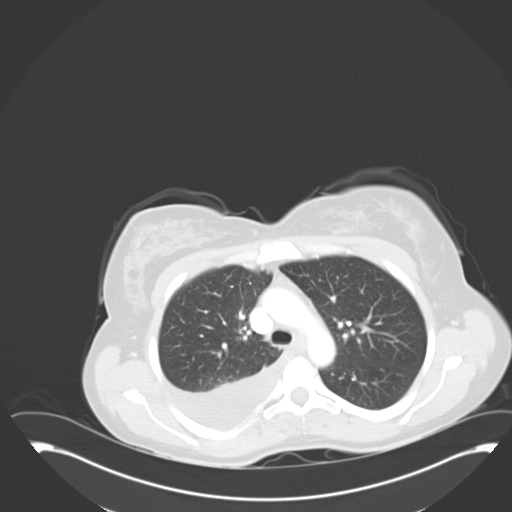
[im 120/129  lung]
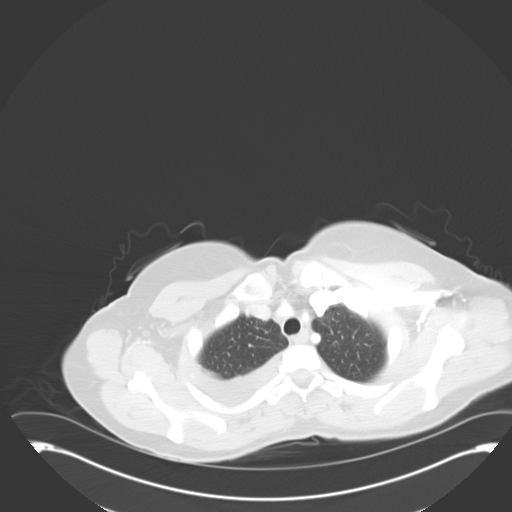

[14 of 30 positions shown; findings below may reference images not displayed]

FINDINGS: CT CHEST FINDINGS

Mediastinum/Lymph Nodes: Pulmonary embolism is seen within the
distal left pulmonary artery and left lower lobe segmental branches.

Mild mediastinal lymphadenopathy is seen in the right and left
cardiophrenic angles, largest measuring 12 mm on the right on image
39. No other lymphadenopathy identified within the thorax.

Lungs/Pleura: Moderate right pleural effusion is seen with
compressive atelectasis. 6 mm pulmonary nodule seen in the anterior
right middle lobe on image 31. No other suspicious pulmonary nodules
or masses identified.

Musculoskeletal: No chest wall mass or suspicious bone lesions
identified.

CT ABDOMEN PELVIS FINDINGS

Hepatobiliary: No masses or other significant abnormality.

Pancreas: No mass, inflammatory changes, or other significant
abnormality.

Spleen: Within normal limits in size and appearance.

Adrenals/Urinary Tract: No masses identified. Mild bilateral
hydronephrosis is seen which appears to be secondary to the large
cystic pelvic mass described below.

Stomach/Bowel: Small hiatal hernia noted. No evidence of obstruction
or inflammatory process.

Vascular/Lymphatic: No pathologically enlarged lymph nodes. No
evidence of abdominal aortic aneursym.

Reproductive: Large complex cystic and solid mass is seen in the
pelvis and lower and mid abdomen anterior to the uterus. This could
represent 2 separate bilateral cystic adnexal masses or 1 large
cystic pelvic mass. This mass or masses measures 27 x 20 by 16 cm,
consistent with cystic ovarian carcinoma. Uterus appears normal.

Other: Mild ascites is seen within the abdomen and pelvis. Diffuse
omental soft tissue caking is seen, consistent with peritoneal
metastatic disease.

Musculoskeletal: No suspicious bone lesions identified. Bilateral L5
pars defects with grade 2 anterolisthesis at L5-S1 noted.
IMPRESSION: Large complex cystic and solid mass in the pelvis and lower abdomen
measuring 27 cm. This could represent separate bilateral cystic
adnexal masses or a single large cystic pelvic mass. This is
consistent with cystic ovarian carcinoma.

Diffuse peritoneal carcinomatosis with mild ascites.

Mild lymphadenopathy in bilateral cardiophrenic angles.

Moderate right pleural effusion and atelectasis.

Indeterminate 6 mm right middle lobe pulmonary nodule. Recommend
attention on follow-up CT.

Acute left pulmonary embolism. Critical Value/emergent results were
called by telephone at the time of interpretation on 01/31/2015 at
[DATE] to Dr. KATHLYN LPZ , who verbally acknowledged these results.

## 2017-05-05 ENCOUNTER — Other Ambulatory Visit (HOSPITAL_BASED_OUTPATIENT_CLINIC_OR_DEPARTMENT_OTHER): Payer: BLUE CROSS/BLUE SHIELD

## 2017-05-05 ENCOUNTER — Ambulatory Visit (HOSPITAL_COMMUNITY)
Admission: RE | Admit: 2017-05-05 | Discharge: 2017-05-05 | Disposition: A | Discharge status: Home / Self Care | Payer: BLUE CROSS/BLUE SHIELD | Source: Ambulatory Visit | Attending: Hematology and Oncology | Admitting: Hematology and Oncology

## 2017-05-05 DIAGNOSIS — M5137 Other intervertebral disc degeneration, lumbosacral region: Secondary | ICD-10-CM | POA: Diagnosis not present

## 2017-05-05 DIAGNOSIS — C562 Malignant neoplasm of left ovary: Principal | ICD-10-CM

## 2017-05-05 DIAGNOSIS — C561 Malignant neoplasm of right ovary: Secondary | ICD-10-CM | POA: Diagnosis not present

## 2017-05-05 DIAGNOSIS — C569 Malignant neoplasm of unspecified ovary: Secondary | ICD-10-CM

## 2017-05-05 DIAGNOSIS — C563 Malignant neoplasm of bilateral ovaries: Secondary | ICD-10-CM

## 2017-05-05 LAB — COMPREHENSIVE METABOLIC PANEL
ALT: 18 U/L (ref 0–55)
ANION GAP: 7 meq/L (ref 3–11)
AST: 19 U/L (ref 5–34)
Albumin: 3.7 g/dL (ref 3.5–5.0)
Alkaline Phosphatase: 65 U/L (ref 40–150)
BILIRUBIN TOTAL: 0.32 mg/dL (ref 0.20–1.20)
BUN: 11.7 mg/dL (ref 7.0–26.0)
CO2: 29 meq/L (ref 22–29)
Calcium: 8.9 mg/dL (ref 8.4–10.4)
Chloride: 105 mEq/L (ref 98–109)
Creatinine: 1 mg/dL (ref 0.6–1.1)
Glucose: 90 mg/dl (ref 70–140)
Potassium: 4 mEq/L (ref 3.5–5.1)
Sodium: 141 mEq/L (ref 136–145)
TOTAL PROTEIN: 6.7 g/dL (ref 6.4–8.3)

## 2017-05-05 LAB — CBC WITH DIFFERENTIAL/PLATELET
BASO%: 0.8 % (ref 0.0–2.0)
Basophils Absolute: 0 10*3/uL (ref 0.0–0.1)
EOS ABS: 0.1 10*3/uL (ref 0.0–0.5)
EOS%: 2.1 % (ref 0.0–7.0)
HCT: 40.1 % (ref 34.8–46.6)
HGB: 12.9 g/dL (ref 11.6–15.9)
LYMPH%: 45.8 % (ref 14.0–49.7)
MCH: 29.5 pg (ref 25.1–34.0)
MCHC: 32.2 g/dL (ref 31.5–36.0)
MCV: 91.8 fL (ref 79.5–101.0)
MONO#: 0.4 10*3/uL (ref 0.1–0.9)
MONO%: 7.6 % (ref 0.0–14.0)
NEUT#: 2.1 10*3/uL (ref 1.5–6.5)
NEUT%: 43.7 % (ref 38.4–76.8)
NRBC: 0 % (ref 0–0)
PLATELETS: 195 10*3/uL (ref 145–400)
RBC: 4.37 10*6/uL (ref 3.70–5.45)
RDW: 13.3 % (ref 11.2–14.5)
WBC: 4.8 10*3/uL (ref 3.9–10.3)
lymph#: 2.2 10*3/uL (ref 0.9–3.3)

## 2017-05-05 MED ORDER — IOPAMIDOL (ISOVUE-300) INJECTION 61%
100.0000 mL | Freq: Once | INTRAVENOUS | Status: AC | PRN
  Administered 2017-05-05: 100 mL via INTRAVENOUS

## 2017-05-06 ENCOUNTER — Encounter (INDEPENDENT_AMBULATORY_CARE_PROVIDER_SITE_OTHER): Payer: Self-pay

## 2017-05-06 ENCOUNTER — Inpatient Hospital Stay: Payer: BLUE CROSS/BLUE SHIELD | Attending: Hematology and Oncology | Admitting: Hematology and Oncology

## 2017-05-06 ENCOUNTER — Encounter: Payer: Self-pay | Admitting: Hematology and Oncology

## 2017-05-06 ENCOUNTER — Encounter: Payer: Self-pay | Admitting: *Deleted

## 2017-05-06 ENCOUNTER — Telehealth: Payer: Self-pay | Admitting: *Deleted

## 2017-05-06 DIAGNOSIS — R5383 Other fatigue: Secondary | ICD-10-CM

## 2017-05-06 DIAGNOSIS — Z006 Encounter for examination for normal comparison and control in clinical research program: Secondary | ICD-10-CM | POA: Diagnosis not present

## 2017-05-06 DIAGNOSIS — C563 Malignant neoplasm of bilateral ovaries: Secondary | ICD-10-CM

## 2017-05-06 DIAGNOSIS — Z8543 Personal history of malignant neoplasm of ovary: Secondary | ICD-10-CM

## 2017-05-06 DIAGNOSIS — Z9221 Personal history of antineoplastic chemotherapy: Secondary | ICD-10-CM

## 2017-05-06 DIAGNOSIS — F419 Anxiety disorder, unspecified: Secondary | ICD-10-CM | POA: Diagnosis not present

## 2017-05-06 DIAGNOSIS — C561 Malignant neoplasm of right ovary: Secondary | ICD-10-CM

## 2017-05-06 DIAGNOSIS — Z9013 Acquired absence of bilateral breasts and nipples: Secondary | ICD-10-CM

## 2017-05-06 DIAGNOSIS — Z90710 Acquired absence of both cervix and uterus: Secondary | ICD-10-CM | POA: Diagnosis not present

## 2017-05-06 DIAGNOSIS — C562 Malignant neoplasm of left ovary: Principal | ICD-10-CM

## 2017-05-06 DIAGNOSIS — G479 Sleep disorder, unspecified: Secondary | ICD-10-CM | POA: Diagnosis not present

## 2017-05-06 LAB — CA 125: Cancer Antigen (CA) 125: 6.1 U/mL (ref 0.0–38.1)

## 2017-05-06 NOTE — Assessment & Plan Note (Addendum)
I have reviewed imaging study and blood work with her She has no signs of cancer at present time I shared with her data from recent FDA approval for patients with BRCA mutation, current guidelines suggest that she would benefit from taking Olaparib as maintenance therapy.  However, in her situation, she has remained cancer free from her last treatment, so the rationale of taking maintenance Olaparib at this point is not as clear-cut beneficial. I would discuss this with her GYN oncologist.  I have shared with her some information to read regarding the risks, benefits, side effects of Olaparib

## 2017-05-06 NOTE — Patient Instructions (Signed)
Olaparib oral capsules What is this medicine? OLAPARIB (oh LA pa rib) is a chemotherapy drug. It targets specific enzymes within cancer cells and stops the cancer cell from growing. This medicine is used to treat ovarian cancer. This medicine may be used for other purposes; ask your health care provider or pharmacist if you have questions. COMMON BRAND NAME(S): Lynparza What should I tell my health care provider before I take this medicine? They need to know if you have any of these conditions: -anemia -kidney disease -liver disease -lung disease -low blood counts, like low white cell, platelet, or red cell counts -an unusual or allergic reaction to olaparib, other medicines, foods, dyes, or preservatives -pregnant or trying to get pregnant -breast-feeding How should I use this medicine? Take this medicine by mouth with a glass of water. Follow the directions on the prescription label. Do not cut, crush, or chew this medicine. You can take it with or without food. If it upsets your stomach, take it with food. However, avoid grapefruit juice, grapefruit or [place] oranges while on this medicine. Take your medicine at regular intervals. Do not take it more often than directed. Do not stop taking except on your doctor's advice. A special MedGuide will be given to you by the pharmacist with each prescription and refill. Be sure to read this information carefully each time. Talk to your pediatrician regarding the use of this medicine in children. Special care may be needed. Overdosage: If you think you have taken too much of this medicine contact a poison control center or emergency room at once. NOTE: This medicine is only for you. Do not share this medicine with others. What if I miss a dose? If you miss a dose, take it as soon as you can. If it is almost time for your next dose, take only that dose. Do not take double or extra doses. What may interact with this medicine? -antiviral medicines  for hepatitis, HIV or AIDS -aprepitant -boceprevir -bosentan -carbamazepine -certain medicines for fungal infections like fluconazole, ketoconazole, itraconazole, posaconazole, and voriconazole -certain medicines for infections, such as ciprofloxacin, clarithromycin, erythromycin, telithromycin -crizotinib -diltiazem -grapefruit juice -imatinib -modafinil -nafcillin -nefazodone -phenobarbital -phenytoin -rifampin -[place] oranges -St. John's Wort -telaprevir -verapamil This list may not describe all possible interactions. Give your health care provider a list of all the medicines, herbs, non-prescription drugs, or dietary supplements you use. Also tell them if you smoke, drink alcohol, or use illegal drugs. Some items may interact with your medicine. What should I watch for while using this medicine? This drug may make you feel generally unwell. This is not uncommon, as chemotherapy can affect healthy cells as well as cancer cells. Report any side effects. Continue your course of treatment even though you feel ill unless your doctor tells you to stop. You will need blood work done while you are taking this medicine. This medicine may increase your risk to bruise or bleed. Call your doctor or health care professional if you notice any unusual bleeding. Call your doctor or health care professional for advice if you get a fever, chills or sore throat, or other symptoms of a cold or flu. Do not treat yourself. This drug decreases your body's ability to fight infections. Try to avoid being around people who are sick. If you are going to have surgery or any other procedures, tell your doctor you are taking this medicine. Do not become pregnant while taking this medicine or for 6 months after the last dose. Women should   inform their doctor if they wish to become pregnant or think they might be pregnant. Men should not father a child while taking this medicine and for 3 months after stopping it.  Do not donate sperm while taking this medicine and for 3 months after you stop taking this medicine. There is a potential for serious side effects to an unborn child. Talk to your health care professional or pharmacist for more information. Women who are able to become pregnant should use effective birth control during treatment and for at least 6 months after receiving the last dose. Talk to your healthcare provider about birth control methods that may be right for you. Do not breast-feed an infant while taking this medicine or for 1 month after the last dose. What side effects may I notice from receiving this medicine? Side effects that you should report to your doctor or health care professional as soon as possible: -allergic reactions like skin rash, itching or hives, swelling of the face, lips, or tongue -breathing problems, like shortness of breath, cough, or wheezing -fever -low blood counts - this medicine may decrease the number of white blood cells, red blood cells and platelets. You may be at increased risk for infections and bleeding. -signs and symptoms of bleeding such as bloody or black, tarry stools; red or dark-brown urine; spitting up blood or brown material that looks like coffee grounds; red spots on the skin; unusual bruising or bleeding from the eye, gums, or nose -signs and symptoms of a blood clot such as changes in vision; chest pain with breathing problems; severe, sudden headache; pain, swelling, warmth in the leg; trouble speaking; sudden numbness or weakness of the face, arm or leg -signs and symptoms of low magnesium like muscle cramps or muscle pain; tingling or tremors; muscle weakness; seizures; or fast, irregular heartbeat -signs and symptoms of infection like fever or chills; cough; sore throat; pain or trouble passing urine -weak or tired Side effects that usually do not require medical attention (report to your doctor or health care professional if they continue or  are bothersome): -changes in taste -diarrhea -headache -heartburn, indigestion -loss of appetite -muscle or joint pain -nausea/vomiting -runny nose -stomach pain -weight loss This list may not describe all possible side effects. Call your doctor for medical advice about side effects. You may report side effects to FDA at 1-800-FDA-1088. Where should I keep my medicine? Keep out of the reach of children. Store between 20 and 25 degrees C (68 and 77 degrees F). Do not store at temperatures greater than 40 degrees C (104 degrees F) and do not take this medicine if you think it may have been stored at a temperature greater than 104 degrees F. Throw away any unused medicine after the expiration date. NOTE: This sheet is a summary. It may not cover all possible information. If you have questions about this medicine, talk to your doctor, pharmacist, or health care provider.  2018 Elsevier/Gold Standard (2016-05-19 12:45:04)  

## 2017-05-06 NOTE — Assessment & Plan Note (Signed)
She is taking Effexor She took lorazepam at night to help her sleep We discussed the importance of exercise to help with her coping strategy

## 2017-05-06 NOTE — Telephone Encounter (Signed)
Scheduled appts per Teacher, English as a foreign language. Appts given to nurse.

## 2017-05-06 NOTE — Assessment & Plan Note (Signed)
She has excessive chronic fatigue She is also taking a lot of sedated medications at night We discussed the importance of exercise program

## 2017-05-06 NOTE — Progress Notes (Signed)
[facility] PROGRESS NOTE  Patient Care Team: Shirline Frees, MD as PCP - General (Family Medicine) Harlan Stains, MD as Referring Physician (Family Medicine) Benson Norway, RN as Registered Nurse (Oncology)  SUMMARY OF ONCOLOGIC HISTORY: Oncology History   ER patchy expression BRCA 1 mutation Participated in GOG 0225 study     Bilateral primary ovarian cancer ([place])   11/15/2014 Initial Diagnosis    She presented with abnormal heavy bleeding      01/25/2015 Imaging    US pelvis 1. Interval development of large solid and cystic masses in the adnexal regions bilaterally. Normal ovarian tissue is not identified. Findings are suspicious for malignancy. 2. Ascites. 3. Normal appearance of the uterus. 4. Primary or metastatic malignancy could have this appearance. 5. Normal endometrium.      01/28/2015 Tumor Marker    Patient's tumor was tested for the following markers: CA125. Results of the tumor marker test revealed 3142.      01/31/2015 Imaging    CT scan:  Large complex cystic and solid mass in the pelvis and lower abdomen measuring 27 cm. This could represent separate bilateral cystic adnexal masses or a single large cystic pelvic mass. This is consistent with cystic ovarian carcinoma. Diffuse peritoneal carcinomatosis with mild ascites. Mild lymphadenopathy in bilateral cardiophrenic angles. Moderate right pleural effusion and atelectasis. Indeterminate 6 mm right middle lobe pulmonary nodule. Recommend attention on follow-up CT. Acute left pulmonary embolism.       01/31/2015 Pathology Results    Endometrium, biopsy - PROLIFERATIVE/EARLY SECRETORY (INTERVAL PHASE) ENDOMETRIUM. - NO HYPERPLASIA OR MALIGNANCY      02/01/2015 Surgery    Successful ultrasound guided paracentesis yielding 800 mL of ascites      02/01/2015 Pathology Results    PERITONEAL/ASCITIC FLUID(SPECIMEN 1 OF 2 COLLECTED 02/01/15): MALIGNANT CELLS CONSISTENT WITH HIGH GRADE  SEROUS CARCINOMA.      02/01/2015 Pathology Results    PLEURAL FLUID, RIGHT(SPECIMEN 2 OF 2 COLLECTED 02/01/15): MALIGNANT CELLS PRESENT CONSISTENT WITH HIGH GRADE CARCINOMA      02/01/2015 Procedure    Successful ultrasound guided right thoracentesis yielding 800 mL of pleural fluid      02/12/2015 - 06/24/2015 Chemotherapy    She received 6 cycles of carboplatin and Taxol      02/18/2015 Tumor Marker    Patient's tumor was tested for the following markers: CA125. Results of the tumor marker test revealed 713      02/28/2015 Genetic Testing    Patient has genetic testing done for BRCA mutation. Results revealed patient has the following mutation(s): BRCA 1 mutation      02/28/2015 Tumor Marker    Patient's tumor was tested for the following markers: CA125. Results of the tumor marker test revealed 3682      03/15/2015 Imaging    No free ascites identified in the pelvis to warrant paracentesis. The large loculated complex cystic and solid masses in the pelvis were again identified by ultrasound.      03/29/2015 Imaging    Ct abdomen:  1. The dominant cystic mass within the abdomen and pelvis is not significantly changed in size when compared with previous exam. Decrease in peritoneal nodularity and soft tissue infiltration. Interval decrease in volume of abdominal and pelvic ascites. 2. There has been decrease in volume of right pleural effusion with resolution of right middle lobe lung nodule. 3. No new or progressive disease identified      04/16/2015 Pathology Results    1. Adnexa -  ovary +/- tube, neoplastic, right - HIGH GRADE CARCINOMA WITH SEROUS FEATURES INVOLVING OVARY, SEE COMMENT. - BENIGN FALLOPIAN TUBE; NEGATIVE FOR ATYPIA AND MALIGNANCY. 2. Adnexa - ovary +/- tube, neoplastic, left - HIGH GRADE CARCINOMA WITH SEROUS FEATURES INVOLVING OVARY, SEE COMMENT. - BENIGN FALLOPIAN TUBE; NEGATIVE FOR ATYPIA AND MALIGNANCY. 3. Uterus +/- tubes/ovaries, neoplastic,  cervix - PROLIFERATIVE PHASE ENDOMETRIUM; NEGATIVE FOR HYPERPLASIA AND MALIGNANCY. - BENIGN CERVICAL MUCOSA; NEGATIVE FOR INTRAEPITHELIAL LESION OR MALIGNANCY. - UNREMARKABLE UTERINE SEROSA. - BENIGN PARA-UTERAL FIBROVASCULAR AND ADIPOSE SOFT TISSUE; NEGATIVE FOR ATYPIA AND MALIGNANCY. 4. Omentum, resection for tumor - POSITIVE FOR METASTATIC HIGH GRADE CARCINOMA, SEE COMMENT.  Representative section of tumor from part 2 demonstrates the following immunophenotype: cytokeratin 7 - strong diffuse expression. cytokeratin 20 - negative expression. estrogen receptor - patchy mild to moderate expression. p16 - negative expression. p63 - diffuse moderate strong expression. vimentin - strong diffuse expression. CDX2 - negative expression. WT-1 - patchy moderate strong expression. Overall the morphology and immunophenotype are that of high grade carcinoma with serous features (grade 4 serous carcinoma). Given that both ovaries demonstrate large masses of similar size, it is uncertain as to which of the two ovaries represent the primary site.   4. Representative sections from the submitted omental tissue demonstrate multiple foci of metastatic carcinoma associated with fibroinflammatory tissue reaction consistent with invasive omental implants.      04/16/2015 Surgery    Procedure(s) Performed: Exploratory laparotomy with total abdominal hysterectomy bilateral salpingo-oophorectomy, omentectomy radical tumor debulking for ovarian cancer Operative Findings: 20cm right ovarian cystic neoplasma, left ovary with tumor measuring approximately 10cm. Tumors non-adherent to pelvic structures. No peritoneal nodularity or plaques, grossly normal omentum. No enlarged lymph nodes. Small volume (100cc) ascites. Normal diaphragms.  This represented an optimal cytoreduction (R0) with no gross visible disease remaining (in the peritoneal cavity).      05/31/2015 Tumor Marker    Patient's tumor was tested for the  following markers: CA125. Results of the tumor marker test revealed 11.7      07/22/2015 Imaging    Ct abdomen:  Minimal residual peritoneal thickening in the pelvis, without evidence of discrete masses or ascites. This favors post treatment changes, although a minimal residual peritoneal metastatic disease cannot definitely be excluded. Recommend attention on follow-up imaging. No other sites of metastatic disease identified within the abdomen or pelvis. Incidentally noted abnormal IVC filter location within the right gonadal vein along the lateral wall of the IVC.       08/20/2015 Procedure    1. Successful fluoroscopic guided removal of infrarenal IVC filter.  2. Normal inferior venacavagram.       01/29/2016 Surgery    She underwent bilateral simple mastectomy      01/29/2016 Pathology Results    She has negative surgical pathology in her breast      04/08/2016 Surgery    She underwent bilateral breast reconstruction surgery      11/12/2016 Imaging    CT scan of abdomen:  Re- demonstrated minimal peritoneal thickening within the pelvis without discrete nodularity, potentially post treatment changes. Recommend attention on followup. No evidence for additional sites of metastatic disease within the abdomen or pelvis.      11/12/2016 Tumor Marker    Patient's tumor was tested for the following markers: CA125. Results of the tumor marker test revealed 4.8      05/05/2017 Imaging    1. No evidence of metastatic disease. 2. Trace fluid adjacent to the rectosigmoid colon, decreased from prior,  and likely postoperative in etiology. 3. Grade 2 anterolisthesis of L5 on S1 with advanced secondary degenerative disc disease.       INTERVAL HISTORY: Please see below for problem oriented charting. She returns for further follow-up She continues to struggle with stress, poor sleep, frequent migraines and chronic fatigue Some of her problems are related to stress from work.  She needs  approximately 12 hours of sleep daily to feel better.  However, she continues to have chronic fatigue during daytime From the cancer standpoint, she denies abdominal bloating, changes in bowel habits or nausea She denies any recent abnormal breast examination, palpable mass, abnormal breast appearance or nipple changes She denies recent infection  REVIEW OF SYSTEMS:   Constitutional: Denies fevers, chills or abnormal weight loss Eyes: Denies blurriness of vision Ears, nose, mouth, throat, and face: Denies mucositis or sore throat Respiratory: Denies cough, dyspnea or wheezes Cardiovascular: Denies palpitation, chest discomfort or lower extremity swelling Gastrointestinal:  Denies nausea, heartburn or change in bowel habits Skin: Denies abnormal skin rashes Lymphatics: Denies new lymphadenopathy or easy bruising Neurological:Denies numbness, tingling or new weaknesses Behavioral/Psych: Mood is stable, no new changes  All other systems were reviewed with the patient and are negative.  I have reviewed the past medical history, past surgical history, social history and family history with the patient and they are unchanged from previous note.  ALLERGIES:  is allergic to [employer] tartrate]; compazine [prochlorperazine edisylate]; and tylox [oxycodone-acetaminophen].  MEDICATIONS:  Current Outpatient Medications  Medication Sig Dispense Refill  . docusate sodium (COLACE) 100 MG capsule Take 100 mg by mouth 2 (two) times daily.    Marland Kitchen venlafaxine (EFFEXOR) 100 MG tablet Take 100 mg by mouth 2 (two) times daily.    . Acetaminophen 500 MG coapsule Take by mouth.    Marland Kitchen aspirin EC 81 MG tablet Take 81 mg by mouth daily.    . Cholecalciferol (VITAMIN D) 2000 UNITS tablet Take 4,000 Units by mouth daily.    Marland Kitchen estradiol (ESTRACE) 0.5 MG tablet TAKE 2 TABLETS (1 MG TOTAL) BY MOUTH DAILY. 60 tablet 9  . hydrOXYzine (ATARAX/VISTARIL) 25 MG tablet Take 50 mg by mouth at bedtime.     Marland Kitchen LORazepam  (ATIVAN) 1 MG tablet 1 SL or PO every 6 hours as needed for nausea. May use 1-2 tablets  HS as needed 30 tablet 0  . Magnesium 100 MG CAPS Take by mouth.    . Melatonin 5 MG TABS Take 5 mg by mouth.    . montelukast (SINGULAIR) 10 MG tablet Take 10 mg by mouth at bedtime.    . naproxen (NAPROSYN) 500 MG tablet TAKE 1 TABLET BY MOUTH EVERY 12 HOURS AS NEEDED FOR PAIN .TAKE WITH FOOD OR MILK  3  . pantoprazole (PROTONIX) 40 MG tablet Take 40 mg by mouth daily.    Marland Kitchen PREVIDENT 5000 BOOSTER PLUS 1.1 % PSTE Reported on 09/12/2015  5  . triamcinolone cream (KENALOG) 0.1 % For exzema     No current facility-administered medications for this visit.     PHYSICAL EXAMINATION: ECOG PERFORMANCE STATUS: 1 - Symptomatic but completely ambulatory  Vitals:   05/06/17 1325  BP: 107/66  Pulse: 77  Resp: 18  Temp: 98.4 F (36.9 C)  SpO2: 100%   Filed Weights   05/06/17 1325  Weight: 156 lb (70.8 kg)    GENERAL:alert, no distress and comfortable SKIN: skin color, texture, turgor are normal, no rashes or significant lesions EYES: normal, Conjunctiva are pink  and non-injected, sclera clear OROPHARYNX:no exudate, no erythema and lips, buccal mucosa, and tongue normal  NECK: supple, thyroid normal size, non-tender, without nodularity LYMPH:  no palpable lymphadenopathy in the cervical, axillary or inguinal LUNGS: clear to auscultation and percussion with normal breathing effort HEART: regular rate & rhythm and no murmurs and no lower extremity edema ABDOMEN:abdomen soft, non-tender and normal bowel sounds Musculoskeletal:no cyanosis of digits and no clubbing  NEURO: alert & oriented x 3 with fluent speech, no focal motor/sensory deficits Bilateral breast examination were performed.  Well-healed bilateral mastectomy scars with implants in situ.  No palpable abnormalities  LABORATORY DATA:  I have reviewed the data as listed    Component Value Date/Time   NA 141 05/05/2017 0949   K 4.0 05/05/2017  0949   CL 106 08/20/2015 0850   CO2 29 05/05/2017 0949   GLUCOSE 90 05/05/2017 0949   BUN 11.7 05/05/2017 0949   CREATININE 1.0 05/05/2017 0949   CALCIUM 8.9 05/05/2017 0949   PROT 6.7 05/05/2017 0949   ALBUMIN 3.7 05/05/2017 0949   AST 19 05/05/2017 0949   ALT 18 05/05/2017 0949   ALKPHOS 65 05/05/2017 0949   BILITOT 0.32 05/05/2017 0949   GFRNONAA >60 08/20/2015 0850   GFRAA >60 08/20/2015 0850    No results found for: SPEP, UPEP  Lab Results  Component Value Date   WBC 4.8 05/05/2017   NEUTROABS 2.1 05/05/2017   HGB 12.9 05/05/2017   HCT 40.1 05/05/2017   MCV 91.8 05/05/2017   PLT 195 05/05/2017      Chemistry      Component Value Date/Time   NA 141 05/05/2017 0949   K 4.0 05/05/2017 0949   CL 106 08/20/2015 0850   CO2 29 05/05/2017 0949   BUN 11.7 05/05/2017 0949   CREATININE 1.0 05/05/2017 0949      Component Value Date/Time   CALCIUM 8.9 05/05/2017 0949   ALKPHOS 65 05/05/2017 0949   AST 19 05/05/2017 0949   ALT 18 05/05/2017 0949   BILITOT 0.32 05/05/2017 0949       RADIOGRAPHIC STUDIES: I have reviewed imaging study with the patient I have personally reviewed the radiological images as listed and agreed with the findings in the report. Ct Abdomen Pelvis W Contrast  Result Date: 05/05/2017 CLINICAL DATA:  Stage IV ovarian cancer, status post chemotherapy and surgery. EXAM: CT ABDOMEN AND PELVIS WITH CONTRAST TECHNIQUE: Multidetector CT imaging of the abdomen and pelvis was performed using the standard protocol following bolus administration of intravenous contrast. CONTRAST:  126m ISOVUE-300 IOPAMIDOL (ISOVUE-300) INJECTION 61% COMPARISON:  11/12/2016. FINDINGS: Lower chest: Negative. Hepatobiliary: Liver and gallbladder are unremarkable. No biliary ductal dilatation. Pancreas: Negative. Spleen: Negative. Adrenals/Urinary Tract: Adrenal glands are unremarkable. Subcentimeter low-attenuation lesion in the right kidney is too small to characterize but  statistically, a cyst is most likely. Kidneys are otherwise unremarkable. Ureters are decompressed. Bladder is low in volume. Stomach/Bowel: Stomach, small bowel and appendix are unremarkable. Stool is seen throughout the colon, indicative of constipation. Colon is otherwise unremarkable. Vascular/Lymphatic: Vascular structures are unremarkable. No pathologically enlarged lymph nodes. Reproductive: Hysterectomy.  No adnexal mass. Other: Trace fluid adjacent to the rectosigmoid colon (series 2, image 58), decreased from prior. Mesenteries and peritoneum are unremarkable. Musculoskeletal: No worrisome lytic or sclerotic lesions. Chronic bilateral L5 pars defects with grade 2 anterolisthesis of L5 on S1 and advanced secondary degenerative disc disease. IMPRESSION: 1. No evidence of metastatic disease. 2. Trace fluid adjacent to the rectosigmoid colon, decreased  from prior, and likely postoperative in etiology. 3. Grade 2 anterolisthesis of L5 on S1 with advanced secondary degenerative disc disease. Electronically Signed   By: Lorin Picket M.D.   On: 05/05/2017 10:42    ASSESSMENT & PLAN:  Bilateral primary ovarian cancer ([place]) I have reviewed imaging study and blood work with her She has no signs of cancer at present time I shared with her data from recent FDA approval for patients with BRCA mutation, current guidelines suggest that she would benefit from taking Olaparib as maintenance therapy.  However, in her situation, she has remained cancer free from her last treatment, so the rationale of taking maintenance Olaparib at this point is not as clear-cut beneficial. I would discuss this with her GYN oncologist.  I have shared with her some information to read regarding the risks, benefits, side effects of Olaparib  S/P mastectomy, bilateral Chest wall examination is benign.  She does not need further mammogram  Anxiety She is taking Effexor She took lorazepam at night to help her sleep We discussed  the importance of exercise to help with her coping strategy  Fatigue She has excessive chronic fatigue She is also taking a lot of sedated medications at night We discussed the importance of exercise program   No orders of the defined types were placed in this encounter.  All questions were answered. The patient knows to call the clinic with any problems, questions or concerns. No barriers to learning was detected. I spent 25 minutes counseling the patient face to face. The total time spent in the appointment was 30 minutes and more than 50% was on counseling and review of test results     Heath Lark, MD 05/06/2017 3:34 PM

## 2017-05-06 NOTE — Progress Notes (Signed)
Patient in clinic today for follow up visit with Dr. Alvy Bimler and Month 18evaluation of GOG-225 study. Blood for standard of care labs and CA-125 drawn yesterday 05/05/17.  Patient also had CT scan of abdomen and pelvis done yesterday 05/05/17 as ordered by Dr. Alvy Bimler. Weight today wasobtained without shoes, in lightweight indoor clothing.  Met with patient in exam room for 10 minutes prior to her appointment with Dr. Alvy Bimler.  Waist measurement was obtained according to protocol section 4.6.1.  Patient reports she is aware of weight gain and not surprised by increased waist measurement today.  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  She states it was challenging over the recent holidays and her birthday to eat healthy and maintain her weight. Patient says she and her husband are committed to eating healthier in this new year.  Reviewed patient's current medication list and she denies any changes to medications.  Thanked patient for her ongoing participation in this study.  Encouraged her to contact research nurse if any questions or concerns prior to next visit. She verbalized understanding.  Physical exam was performed by Dr. Alvy Bimler.   Patient scheduled to see Dr. Denman George in April for month 21 visit and Dr. Alvy Bimler in July for month 24 visit. Foye Spurling, BSN, RN Clinical Research Nurse 05/06/2017 1:30 PM

## 2017-05-06 NOTE — Assessment & Plan Note (Signed)
Chest wall examination is benign.  She does not need further mammogram

## 2017-05-10 ENCOUNTER — Telehealth: Payer: Self-pay | Admitting: *Deleted

## 2017-05-10 NOTE — Telephone Encounter (Signed)
Called and left the patient a message with the new date/time of her appt in April.

## 2017-05-27 ENCOUNTER — Telehealth: Payer: Self-pay | Admitting: *Deleted

## 2017-05-27 NOTE — Telephone Encounter (Signed)
Notified of message below

## 2017-05-27 NOTE — Telephone Encounter (Signed)
-----   Message from Heath Lark, MD sent at 05/27/2017 10:53 AM EST ----- Regarding: FW: ?PARP Pls call her and let her know No need to worry about starting PARP inhibitor now ----- Message ----- From: Everitt Amber, MD Sent: 05/27/2017  10:41 AM To: Dorothyann Gibbs, NP, Heath Lark, MD Subject: RE: ?PARP                                      Yes, Sorry for not following up on this. The consensus is that there is no data for PARPs who are remote from adjuvant therapy (eg prophylaxis). So recommendations is to only treat with PARP if and when she recurs.   Reinaldo Meeker   ----- Message ----- From: Heath Lark, MD Sent: 05/26/2017   7:36 AM To: Everitt Amber, MD, Dorothyann Gibbs, NP Subject: ?PARP                                          Hi Emma,  Have you discussed with your colleagues whether PARP inhibitor would be helpful for her?  Thanks for your help, Ni

## 2017-05-27 NOTE — Telephone Encounter (Signed)
LM to call Dr Gorsuch' nurse 

## 2017-06-07 IMAGING — US US TRANSVAGINAL NON-OB
1 series · 13 of 25 positions shown · non-contrast
Comparison: 05/11/2013

ADDENDUM:
The salient findings were discussed with Dr. Lorraine, the patient's
primary care physicianOn 01/25/2015 at [DATE].
CLINICAL DATA: Abnormal uterine bleeding with clots since
12/17/2014.



[Series 1: us transvaginal non-ob · 0.25mm/px · 13 of 193 slices shown]
[im 1/193]
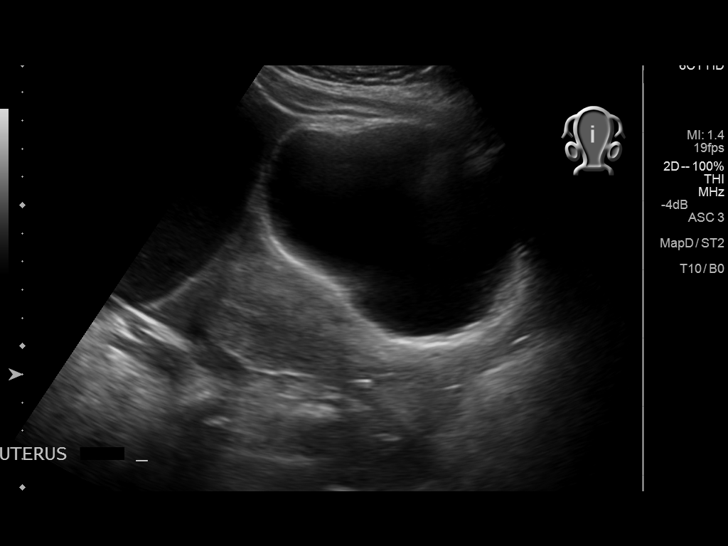
[im 17/193]
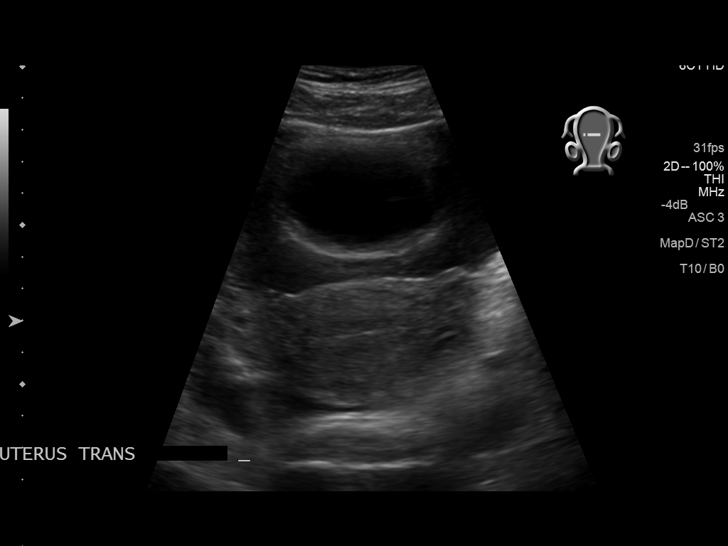
[im 33/193]
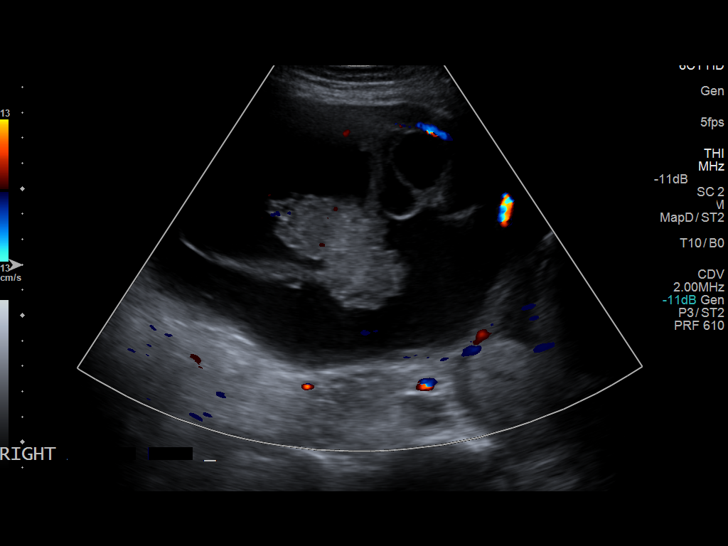
[im 49/193]
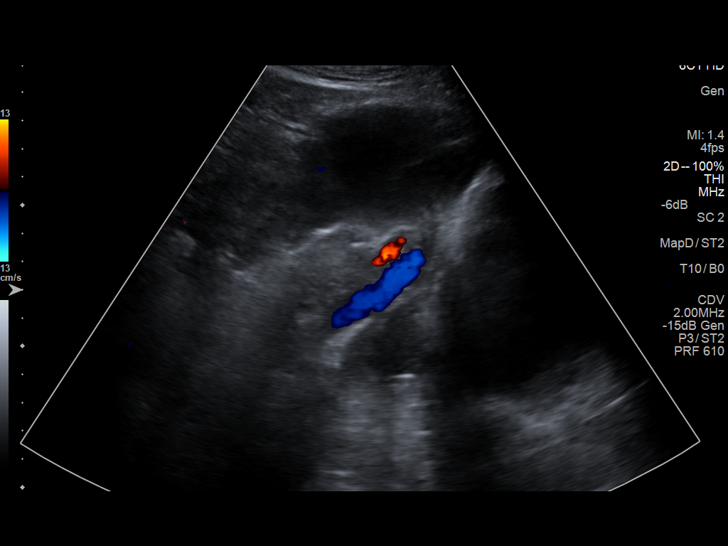
[im 65/193]
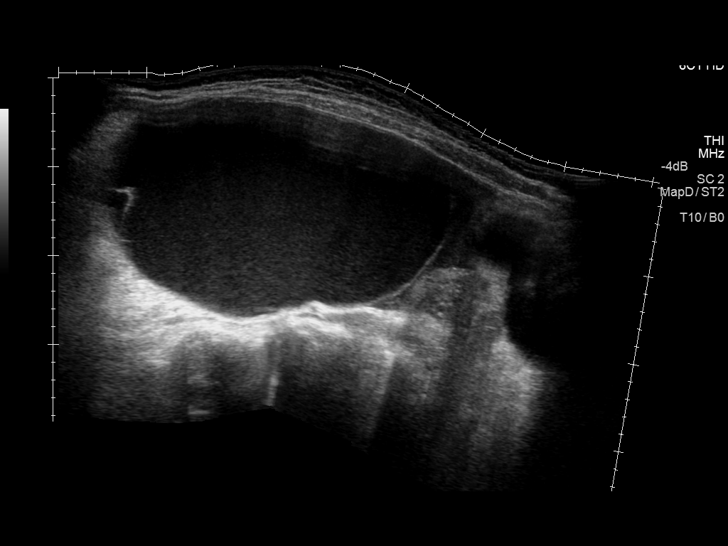
[im 81/193]
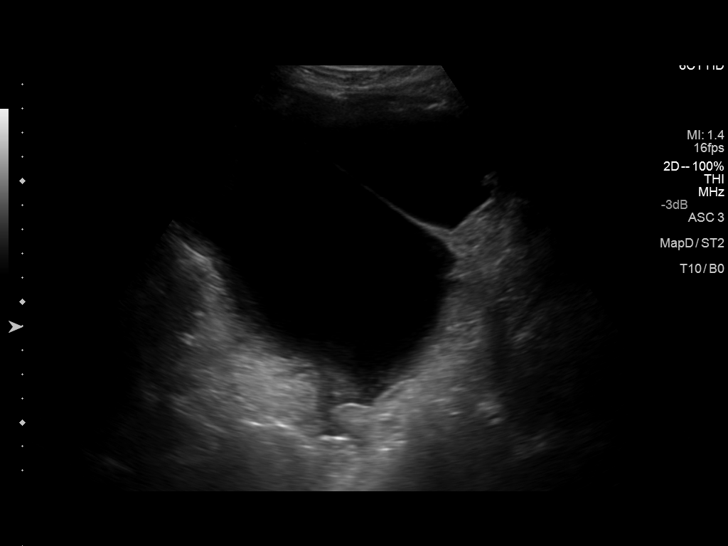
[im 97/193]
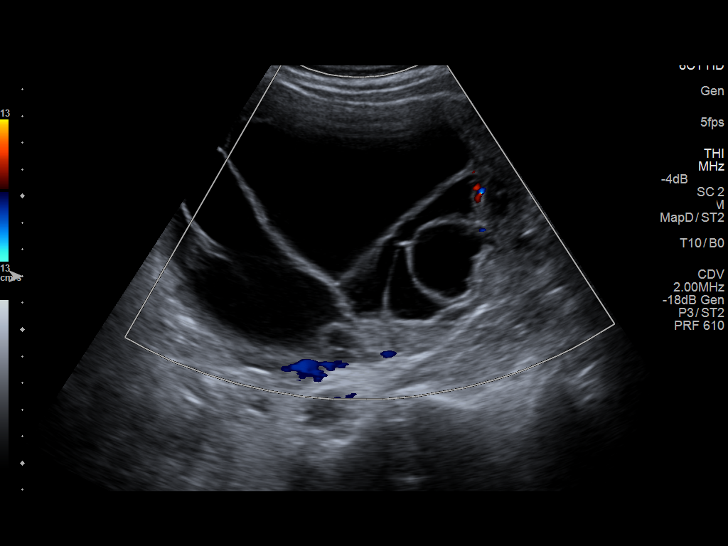
[im 113/193]
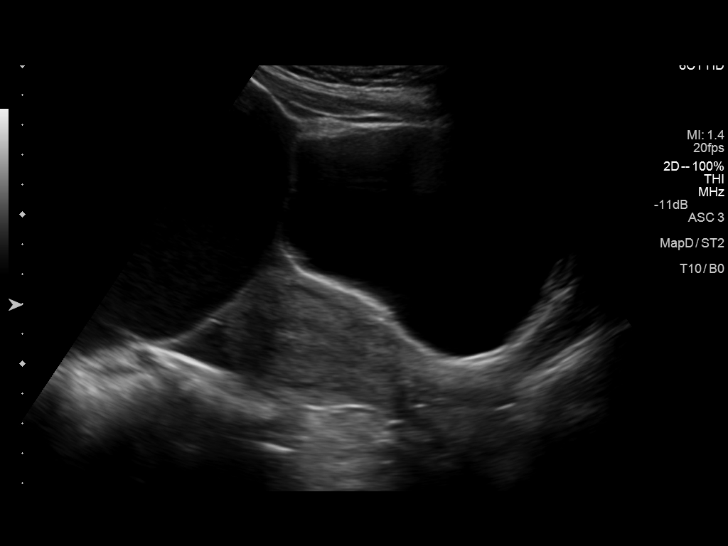
[im 129/193]
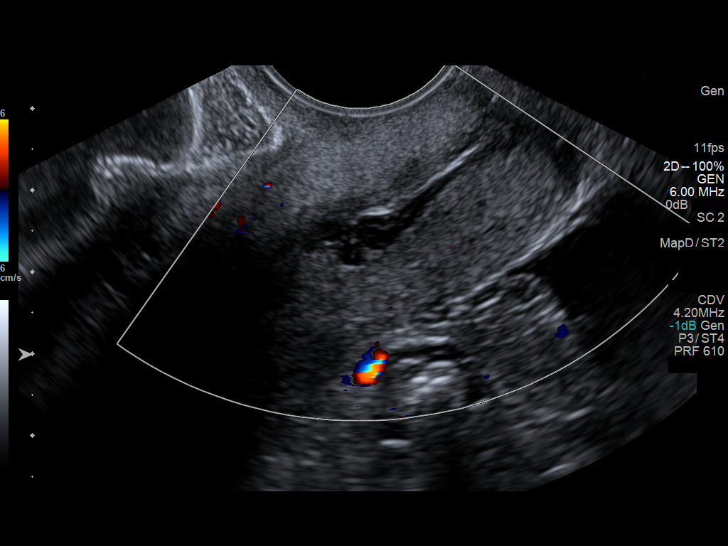
[im 145/193]
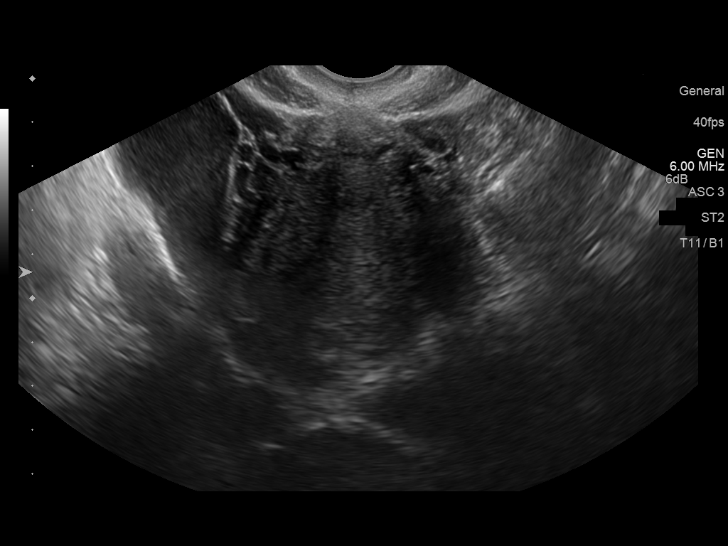
[im 161/193]
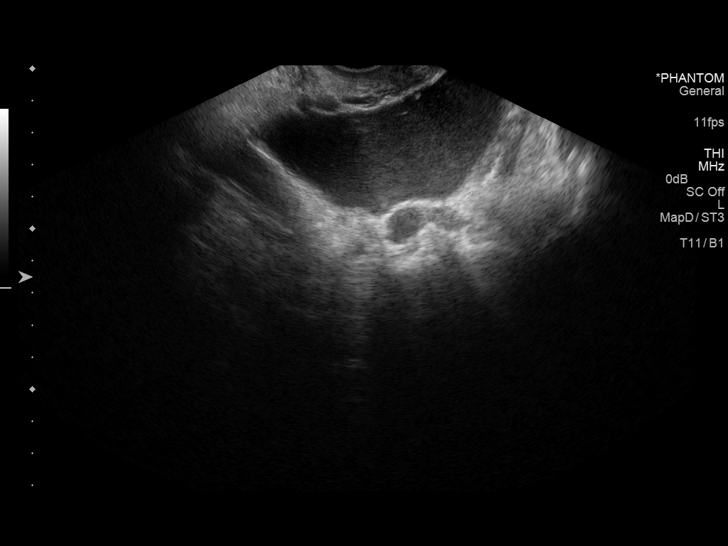
[im 177/193]
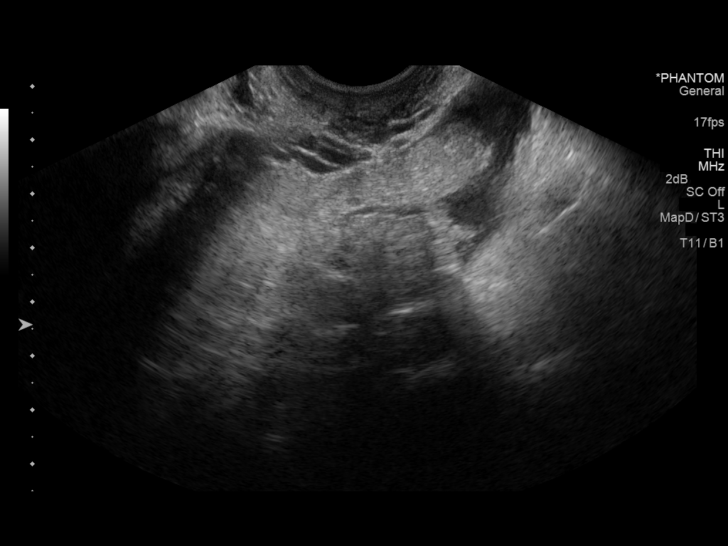
[im 193/193]
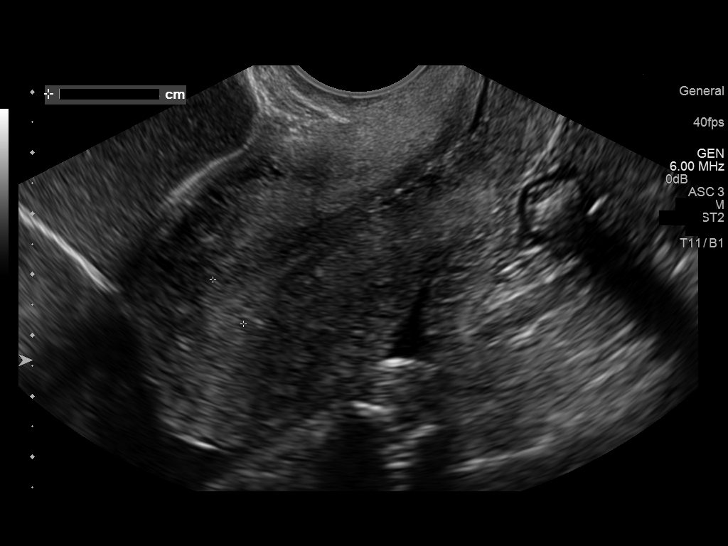

[13 of 25 positions shown; findings below may reference images not displayed]

FINDINGS: Uterus

Measurements: 1.2 x 4.1 x 5.5 cm. No fibroids or other mass
visualized.

Endometrium

Thickness: 9.0 mm.  No focal abnormality visualized.

Right ovary

Measurements: Normal ovary is not seen. Within the right adnexal
region there is a large complex solid and cystic mass. Mass measures
24.3 x 13.1 x 25.3 cm. Solid components are noted within the cystic
mass and thick internal septations are present. Blood flow is
identified within some of the solid components.

Left ovary

Measurements: Normal ovary is not seen. Within the left adnexal
region there is a large complex mass measuring 14.5 x 12.7 x
cm. Solid components and thick septations are present. Blood flow
identified within some of the solid components.

Other findings

Moderate free pelvic fluid identified. Ascites also identified in
the left upper quadrant and right upper quadrant.
IMPRESSION: 1. Interval development of large solid and cystic masses in the
adnexal regions bilaterally. Normal ovarian tissue is not
identified. Findings are suspicious for malignancy.
2. Ascites.
3. Normal appearance of the uterus.
4. Primary or metastatic malignancy could have this appearance.
5. Normal endometrium.
6. These results will be called to the ordering clinician or
representative by the Radiologist Assistant, and communication
documented in the PACS or zVision Dashboard.

## 2017-06-22 ENCOUNTER — Telehealth: Payer: Self-pay | Admitting: *Deleted

## 2017-06-22 NOTE — Telephone Encounter (Signed)
06/22/2017 Left voice mail message for patient to contact me at [phone] for follow-up regarding the research study. One item to be discussed is adding an lab appointment (for CA-125) at the time of her April visit. Noted that patient is close to completing her intervention, due to Month 24 being reached in approximately 4 months (around 11/02/17).  Per request from study Research Specialist, Virgia Land, at [place] [facility], will also inquire about coaching calls, as patient has not returned several messages: "Would you be able to reach out to the participant and let them know their coach was trying to reach them for a call and that she can be reached at [phone] and leave her coach a voicemail. She can also text this message and let her coach know her availability. If you are not able to reach out- do you have a general idea when this participant may be open to having her coach reach out to her again?"  [place]. Brigitte Pulse BSN, RN, [facility] 06/22/2017 3:18 PM

## 2017-06-25 ENCOUNTER — Encounter: Payer: Self-pay | Admitting: *Deleted

## 2017-06-25 NOTE — Progress Notes (Signed)
06/25/2017 See Research nurse note by Foye Spurling for details of Month 18 visit on 05/06/2017.  Adverse Event Log Study/Protocol: GOG 0225 Visit Month 18: 02/15/2017 - 05/06/2017 Event Grade Attribution to Protocol Treatment (Lifestyle Intervention)  Weight gain 1 Unlikely  Fatigue 2 Unlikely  Cindy S. Brigitte Pulse BSN, RN, CCRP 06/25/2017 4:05 PM

## 2017-07-15 ENCOUNTER — Other Ambulatory Visit: Payer: Self-pay | Admitting: Hematology and Oncology

## 2017-07-15 ENCOUNTER — Telehealth: Payer: Self-pay | Admitting: Hematology and Oncology

## 2017-07-15 NOTE — Telephone Encounter (Signed)
Scheduled appt per 3/14 sch message - left message with appt date and time.

## 2017-08-06 ENCOUNTER — Ambulatory Visit: Payer: BLUE CROSS/BLUE SHIELD | Admitting: Gynecologic Oncology

## 2017-08-20 ENCOUNTER — Inpatient Hospital Stay: Payer: BLUE CROSS/BLUE SHIELD

## 2017-08-20 ENCOUNTER — Inpatient Hospital Stay: Payer: BLUE CROSS/BLUE SHIELD | Admitting: *Deleted

## 2017-08-20 ENCOUNTER — Inpatient Hospital Stay: Payer: BLUE CROSS/BLUE SHIELD | Attending: Gynecologic Oncology | Admitting: Gynecologic Oncology

## 2017-08-20 ENCOUNTER — Encounter: Payer: Self-pay | Admitting: Gynecologic Oncology

## 2017-08-20 VITALS — BP 91/63 | HR 65 | Temp 98.1°F | Resp 20 | Ht 66.0 in | Wt 147.5 lb

## 2017-08-20 DIAGNOSIS — Z90722 Acquired absence of ovaries, bilateral: Secondary | ICD-10-CM | POA: Diagnosis not present

## 2017-08-20 DIAGNOSIS — Z90710 Acquired absence of both cervix and uterus: Secondary | ICD-10-CM | POA: Diagnosis not present

## 2017-08-20 DIAGNOSIS — Z9010 Acquired absence of unspecified breast and nipple: Secondary | ICD-10-CM | POA: Insufficient documentation

## 2017-08-20 DIAGNOSIS — Z9221 Personal history of antineoplastic chemotherapy: Secondary | ICD-10-CM | POA: Insufficient documentation

## 2017-08-20 DIAGNOSIS — Z006 Encounter for examination for normal comparison and control in clinical research program: Secondary | ICD-10-CM | POA: Insufficient documentation

## 2017-08-20 DIAGNOSIS — C562 Malignant neoplasm of left ovary: Principal | ICD-10-CM

## 2017-08-20 DIAGNOSIS — Z86711 Personal history of pulmonary embolism: Secondary | ICD-10-CM | POA: Diagnosis not present

## 2017-08-20 DIAGNOSIS — Z8543 Personal history of malignant neoplasm of ovary: Secondary | ICD-10-CM | POA: Diagnosis not present

## 2017-08-20 DIAGNOSIS — Z1501 Genetic susceptibility to malignant neoplasm of breast: Secondary | ICD-10-CM

## 2017-08-20 DIAGNOSIS — C561 Malignant neoplasm of right ovary: Secondary | ICD-10-CM

## 2017-08-20 DIAGNOSIS — C563 Malignant neoplasm of bilateral ovaries: Secondary | ICD-10-CM

## 2017-08-20 DIAGNOSIS — C569 Malignant neoplasm of unspecified ovary: Secondary | ICD-10-CM

## 2017-08-20 LAB — CBC WITH DIFFERENTIAL/PLATELET
BASOS ABS: 0 10*3/uL (ref 0.0–0.1)
Basophils Relative: 1 %
Eosinophils Absolute: 0.1 10*3/uL (ref 0.0–0.5)
Eosinophils Relative: 2 %
HEMATOCRIT: 39.8 % (ref 34.8–46.6)
Hemoglobin: 13.1 g/dL (ref 11.6–15.9)
LYMPHS ABS: 1.5 10*3/uL (ref 0.9–3.3)
LYMPHS PCT: 39 %
MCH: 29.4 pg (ref 25.1–34.0)
MCHC: 33 g/dL (ref 31.5–36.0)
MCV: 89.3 fL (ref 79.5–101.0)
Monocytes Absolute: 0.2 10*3/uL (ref 0.1–0.9)
Monocytes Relative: 6 %
NEUTROS ABS: 2 10*3/uL (ref 1.5–6.5)
Neutrophils Relative %: 52 %
PLATELETS: 237 10*3/uL (ref 145–400)
RBC: 4.45 MIL/uL (ref 3.70–5.45)
RDW: 13.8 % (ref 11.2–14.5)
WBC: 3.8 10*3/uL — AB (ref 3.9–10.3)

## 2017-08-20 LAB — COMPREHENSIVE METABOLIC PANEL
ALT: 13 U/L (ref 0–55)
AST: 16 U/L (ref 5–34)
Albumin: 3.7 g/dL (ref 3.5–5.0)
Alkaline Phosphatase: 76 U/L (ref 40–150)
Anion gap: 6 (ref 3–11)
BILIRUBIN TOTAL: 0.4 mg/dL (ref 0.2–1.2)
BUN: 12 mg/dL (ref 7–26)
CHLORIDE: 106 mmol/L (ref 98–109)
CO2: 28 mmol/L (ref 22–29)
Calcium: 9.1 mg/dL (ref 8.4–10.4)
Creatinine, Ser: 1.04 mg/dL (ref 0.60–1.10)
GFR calc Af Amer: >60 mL/min (ref >60)
Glucose, Bld: 85 mg/dL (ref 70–140)
POTASSIUM: 4.1 mmol/L (ref 3.5–5.1)
Sodium: 140 mmol/L (ref 136–145)
TOTAL PROTEIN: 6.8 g/dL (ref 6.4–8.3)

## 2017-08-20 NOTE — Progress Notes (Signed)
Consult Note: Gyn-Onc  Consult was requested by Dr. Kennon Rounds for the evaluation of Elizabeth Henry 47 y.o. female   CC:  Chief Complaint  Patient presents with  . Ovarian cancer, bilateral ([place])    Assessment/Plan:  Elizabeth Henry  is a 47 y.o.  year old with stage IV ovarian cancer in the setting of acute PE, s/p 3 cycles of neoadjuvant chemotherapy s/p ex lap, TAH, BSO, omentectomy, interval debulking to no macroscopic residual disease on 05/02/15. 3 additional cycles of chemotherapy with carboplatin and paclitaxel completed on 06/24/15.  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  BRCA 1 deleterious mutation positive. s/p risk reducing mastectomy.  She has had a complete response to therapy with no evidence of recurrence.  Follow-up with me in 3 months to see Dr Alvy Bimler then transition to 6 monthly surveillance (to see me in approximately 12/19). Counseled regarding symptoms of recurrence and to return to see me if these develop.    HPI: Elizabeth Henry is a very pleasant 47 year old G2P2 who was initially seen in consultation at the request of Dr Kennon Rounds for bilateral  Ovarian masses, ascites, elevated CA-125, and abnormal uterine bleeding. The patient began experiencing abdominal bloating and early satiety 6 weeks ago in July 2016. She since the end of August 2016 she has experienced vaginal bleeding every day that is light. She denies pain other than stretching pain in her abdomen. She's had some constipation but no change in the caliber of his stools and no blood in her bowel movements.  She was evaluated by Dr. Kennon Rounds on 01/25/2015. A pelvic and abdominal ultrasound scan were ordered and revealed large volume of ascites in the abdomen, bilateral ovarian masses with the right measuring 24.3 x 13.1 x 25.3 cm with solid and cystic components and blood flow within the solid components. The left ovary measured 14.5 x 12.7 x 14.8 cm and  containing solid cystic components with increased blood flow. The endometrial stripe was slightly thickened at 9 mm (cc patient is premenopausal). A CA-125 was drawn on 01/28/2015 and was elevated at 3142 units per milliliter.  A CT chest was performed on 01/31/15 which showed an acute PE and pleural effusions.  She was started on therapeutic lovenox.  She was started on neoadjuvant chemotherapy (due to her acute PE and the concern about discontinuing anticoagulation for surgery in the acute setting).  Prior to starting chemotherapy she underwent paracentesis and thoracentesis (right) both of which confirmed metastatic adencarcinoma consistent with gyn primary.  On 02/12/15 she began neoadjuvant chemotherapy with 3 cycles of paclitaxel and carboplatin.  She underwent BRCA deleterious testing which was positive for a BRCA 1 mutation.  CT on 03/29/15 showed interval resolution of ascites and significant improvement in upper abdominal disease with persistent ovarian cysts.  On 04/16/15 she underwent an ex lap, TAH, BSO, omentectomy and radical tumor debulking to no gross residual disease. She tolerated surgery well.  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  She went on to complete 3 additional adjuvant chemotherapy cycles with carboplatin and paclitaxel. She tolerated treatment well with no concerns. Last cycle of treatment was 06/24/15.  CT abdo/pelvis and chest on 07/22/15 showed no measurable residual disease. The IVC filter appeared to have migrated to the ovarian vessel.  She received a risk reducing mastectomy in September, 2017 at [facility].  Interval Hx:   CT scan (as part of  trial protocol) in July, 2018 was negative for evidence of disease recurrence (stable pelvic peritoneal thickening). CT scan (as part of trial protocol) in December, 2018 was negative for disease recurrence. She had a normal CA 125 of  6.1.  No bloating, pain, early satiety, edema, cough.   She does have low libido which is bothersome and easy bruising but she is on aspirin.  Performance status is ECOG 0 - fully active.  Adverse event assessment negative for adverse events.  Current Meds:  Outpatient Encounter Medications as of 08/20/2017  Medication Sig  . Acetaminophen 500 MG coapsule Take by mouth.  Marland Kitchen aspirin EC 81 MG tablet Take 81 mg by mouth daily.  . Cholecalciferol (VITAMIN D) 2000 UNITS tablet Take 4,000 Units by mouth daily.  Marland Kitchen docusate sodium (COLACE) 100 MG capsule Take 100 mg by mouth 2 (two) times daily.  Marland Kitchen estradiol (ESTRACE) 0.5 MG tablet TAKE 2 TABLETS (1 MG TOTAL) BY MOUTH DAILY.  . hydrOXYzine (ATARAX/VISTARIL) 25 MG tablet Take 50 mg by mouth at bedtime.   Marland Kitchen LORazepam (ATIVAN) 1 MG tablet 1 SL or PO every 6 hours as needed for nausea. May use 1-2 tablets  HS as needed  . Magnesium 100 MG CAPS Take by mouth.  . Melatonin 5 MG TABS Take 5 mg by mouth.  . montelukast (SINGULAIR) 10 MG tablet Take 10 mg by mouth at bedtime.  . naproxen (NAPROSYN) 500 MG tablet TAKE 1 TABLET BY MOUTH EVERY 12 HOURS AS NEEDED FOR PAIN .TAKE WITH FOOD OR MILK  . pantoprazole (PROTONIX) 40 MG tablet Take 40 mg by mouth daily.  Marland Kitchen PREVIDENT 5000 BOOSTER PLUS 1.1 % PSTE Reported on 09/12/2015  . triamcinolone cream (KENALOG) 0.1 % For exzema  . venlafaxine (EFFEXOR) 100 MG tablet Take 100 mg by mouth 2 (two) times daily.   No facility-administered encounter medications on file as of 08/20/2017.     Allergy:  Allergies  Allergen Reactions  . Ambien [Zolpidem Tartrate] Swelling  . Compazine [Prochlorperazine Edisylate] Hives  . Tylox [Oxycodone-Acetaminophen] Hives    Social Hx:   Social History   Socioeconomic History  . Marital status: Married    Spouse name: Coralyn Mark  . Number of children: 2  . Years of education: 29  . Highest education level: Not on file  Occupational History  . Occupation: Self-employed   Social Needs  . Financial resource strain: Not on file  . Food insecurity:    Worry: Not on file    Inability: Not on file  . Transportation needs:    Medical: Not on file    Non-medical: Not on file  Tobacco Use  . Smoking status: Former Smoker    Packs/day: 1.00    Years: 10.00    Pack years: 10.00    Types: Cigarettes    Last attempt to quit: 10/18/2002    Years since quitting: 14.8  . Smokeless tobacco: Never Used  . Tobacco comment: smoked on and off for 10 yrs total - up to 1 ppd at most  Substance and Sexual Activity  . Alcohol use: No    Alcohol/week: 0.0 oz    Comment: very rarely  . Drug use: No  . Sexual activity: Not Currently  Lifestyle  . Physical activity:    Days per week: Not on file    Minutes per session: Not on file  . Stress: Not on file  Relationships  . Social connections:    Talks on phone: Not on file  Gets together: Not on file    Attends religious service: Not on file    Active member of club or organization: Not on file    Attends meetings of clubs or organizations: Not on file    Relationship status: Not on file  . Intimate partner violence:    Fear of current or ex partner: Not on file    Emotionally abused: Not on file    Physically abused: Not on file    Forced sexual activity: Not on file  Other Topics Concern  . Not on file  Social History Narrative   Fun: Go to [employer]; crochet   Denies religious beliefs effecting health care.     Past Surgical Hx:  Past Surgical History:  Procedure Laterality Date  . ABDOMINAL HYSTERECTOMY Bilateral 04/16/2015   Procedure: HYSTERECTOMY ABDOMINAL TOTAL, EXPLORATORY LAPAROTOMY, BILATERAL SALPINGO OOPHERECTOMY, OMENTECTOMY, DEBULKING;  Surgeon: Everitt Amber, MD;  Location: [facility];  Service: Gynecology;  Laterality: Bilateral;  . RECONSTRUCTION BREAST IMMEDIATE / DELAYED W/ TISSUE EXPANDER    . SIMPLE MASTECTOMY    . WISDOM TOOTH EXTRACTION      Past Medical Hx:  Past Medical History:   Diagnosis Date  . Allergy   . Anxiety   . Cancer ([place])   . GERD (gastroesophageal reflux disease)   . Migraines   . Pneumonia    bronchialpneumonia circa 3775  . Pulmonary embolism [facility]) October 2016  . Strep throat     Past Gynecological History:  Remote hx of abn paps.  Patient's last menstrual period was 01/08/2015 (approximate).  Family Hx:  Family History  Problem Relation Age of Onset  . Breast cancer Mother 9  . Healthy Father   . Breast cancer Maternal Grandmother        dx. 21 or younger  . Brain cancer Paternal Grandmother        unspecified tumor type  . HIV Brother   . Leukemia Paternal Uncle   . HIV Brother   . Alzheimer's disease Maternal Grandfather   . Diabetes Maternal Grandfather   . Breast cancer Other   . Cancer Paternal Uncle        unspecified type    Review of Systems:  Constitutional  Feels distended  ENT Normal appearing ears and nares bilaterally Skin/Breast  No rash, sores, jaundice, itching, dryness Cardiovascular  No chest pain, shortness of breath, or edema  Pulmonary  No cough or wheeze.  Gastro Intestinal  No nausea, vomitting, or diarrhoea.  Genito Urinary  No frequency, urgency, dysuria, no vaginal bleeding Musculo Skeletal  No myalgia, arthralgia, joint swelling or pain  Neurologic  No weakness, numbness, change in gait,  Psychology  No depression, anxiety, insomnia.   Vitals:  Blood pressure 91/63, pulse 65, temperature 98.1 F (36.7 C), temperature source Oral, resp. rate 20, height 5' 6"  (1.676 m), weight 147 lb 8 oz (66.9 kg), last menstrual period 01/08/2015, SpO2 99 %.  Physical Exam: WD in NAD Neck  Supple NROM, without any enlargements.  Lymph Node Survey No cervical supraclavicular or inguinal adenopathy Cardiovascular  Pulse normal rate, regularity and rhythm. S1 and S2 normal.  Lungs  Clear to auscultation bilateraly, without wheezes/crackles/rhonchi. Good air movement.  Skin  No  rash/lesions/breakdown  Psychiatry  Alert and oriented to person, place, and time  Abdomen  Normoactive bowel sounds, abdomen non distended with well healed incision. No masses. Back No CVA tenderness Genito Urinary  Vulva/vagina: Normal external female genitalia.   No lesions. No  discharge or bleeding.  Bladder/urethra:  No lesions or masses, well supported bladder  Vagina: cuff healing normally, in tact, no masses  Cervix and uterus: surgically absent  Adnexa: no masses. Rectal  Good tone, no masses no cul de sac nodularity.  Extremities  No bilateral cyanosis, clubbing or edema.   Thereasa Solo, MD  08/20/2017, 1:56 PM

## 2017-08-20 NOTE — Patient Instructions (Signed)
Please notify Dr Denman George at phone number [phone] if you notice vaginal bleeding, new pelvic or abdominal pains, bloating, feeling full easy, or a change in bladder or bowel function.   Please return to see Dr Denman George as scheduled in December, 2019. You have an appointment with Dr Alvy Bimler on 11/05/17.

## 2017-08-20 NOTE — Progress Notes (Signed)
08/20/2017 Patient in to clinic today for Month 21 evaluation at time of routine gynecologic oncology follow-up visit. Patient confirmed that she has been in touch with her study coach, but states that she had to locate a number and contact the coach herself. Patient is aware that after today's visit, the final study visit will occur in July, at which time fasting blood samples will be collected as well as  the required questionnaires Weight was obtained without shoes, in lightweight indoor clothing. Waist measurement was obtained according to protocol section 4.6.1.  Cindy S. Brigitte Pulse BSN, RN, [place] 08/20/2017 2:13 PM  Adverse Event Log Study/Protocol: GOG 0225 Visit Month 21: 05-06-2017 to 08-20-2017 Event Grade Attribution to Protocol Treatment (Lifestyle Intervention)  Bruising 1 Unrelated  Leukocytes 1 Unrelated  Distension (self-reported) 2 Unrelated  Libido 1 Unrelated  Cindy S. Brigitte Pulse BSN, RN, CCRP 08/24/2017 10:59 AM

## 2017-08-21 LAB — CA 125: Cancer Antigen (CA) 125: 5.7 U/mL (ref 0.0–38.1)

## 2017-08-23 ENCOUNTER — Telehealth: Payer: Self-pay

## 2017-08-23 NOTE — Telephone Encounter (Signed)
Told Elizabeth Henry that the CA-125 from 08-20-17 was in good normal range at 5.7 per Melissa Cross,NP.

## 2017-08-26 DIAGNOSIS — F4322 Adjustment disorder with anxiety: Secondary | ICD-10-CM | POA: Diagnosis not present

## 2017-09-24 DIAGNOSIS — F419 Anxiety disorder, unspecified: Secondary | ICD-10-CM | POA: Diagnosis not present

## 2017-09-24 DIAGNOSIS — G4700 Insomnia, unspecified: Secondary | ICD-10-CM | POA: Diagnosis not present

## 2017-09-24 DIAGNOSIS — G43009 Migraine without aura, not intractable, without status migrainosus: Secondary | ICD-10-CM | POA: Diagnosis not present

## 2017-09-24 DIAGNOSIS — K219 Gastro-esophageal reflux disease without esophagitis: Secondary | ICD-10-CM | POA: Diagnosis not present

## 2017-10-06 ENCOUNTER — Encounter: Payer: Self-pay | Admitting: Hematology and Oncology

## 2017-10-08 DIAGNOSIS — F4322 Adjustment disorder with anxiety: Secondary | ICD-10-CM | POA: Diagnosis not present

## 2017-10-16 ENCOUNTER — Encounter: Payer: Self-pay | Admitting: Hematology and Oncology

## 2017-10-18 ENCOUNTER — Telehealth: Payer: Self-pay | Admitting: Hematology and Oncology

## 2017-10-18 NOTE — Telephone Encounter (Signed)
Spoke to patient regarding upcoming June appointment updates per 6/17 sch message

## 2017-10-26 ENCOUNTER — Inpatient Hospital Stay: Payer: BLUE CROSS/BLUE SHIELD | Admitting: *Deleted

## 2017-10-26 ENCOUNTER — Inpatient Hospital Stay: Payer: BLUE CROSS/BLUE SHIELD | Attending: Gynecologic Oncology

## 2017-10-26 VITALS — Ht 63.19 in | Wt 146.0 lb

## 2017-10-26 DIAGNOSIS — C561 Malignant neoplasm of right ovary: Secondary | ICD-10-CM

## 2017-10-26 DIAGNOSIS — Z1501 Genetic susceptibility to malignant neoplasm of breast: Secondary | ICD-10-CM | POA: Diagnosis not present

## 2017-10-26 DIAGNOSIS — C569 Malignant neoplasm of unspecified ovary: Secondary | ICD-10-CM

## 2017-10-26 DIAGNOSIS — Z006 Encounter for examination for normal comparison and control in clinical research program: Secondary | ICD-10-CM | POA: Insufficient documentation

## 2017-10-26 DIAGNOSIS — Z9013 Acquired absence of bilateral breasts and nipples: Secondary | ICD-10-CM | POA: Insufficient documentation

## 2017-10-26 DIAGNOSIS — Z8543 Personal history of malignant neoplasm of ovary: Secondary | ICD-10-CM | POA: Diagnosis not present

## 2017-10-26 DIAGNOSIS — C562 Malignant neoplasm of left ovary: Secondary | ICD-10-CM

## 2017-10-26 DIAGNOSIS — C563 Malignant neoplasm of bilateral ovaries: Secondary | ICD-10-CM

## 2017-10-26 LAB — CBC WITH DIFFERENTIAL/PLATELET
BASOS PCT: 1 %
Basophils Absolute: 0 10*3/uL (ref 0.0–0.1)
EOS ABS: 0.1 10*3/uL (ref 0.0–0.5)
Eosinophils Relative: 2 %
HEMATOCRIT: 40.5 % (ref 34.8–46.6)
HEMOGLOBIN: 13.1 g/dL (ref 11.6–15.9)
LYMPHS ABS: 1.5 10*3/uL (ref 0.9–3.3)
Lymphocytes Relative: 38 %
MCH: 29.4 pg (ref 25.1–34.0)
MCHC: 32.3 g/dL (ref 31.5–36.0)
MCV: 90.8 fL (ref 79.5–101.0)
MONOS PCT: 7 %
Monocytes Absolute: 0.3 10*3/uL (ref 0.1–0.9)
NEUTROS ABS: 2.1 10*3/uL (ref 1.5–6.5)
Neutrophils Relative %: 52 %
Platelets: 200 10*3/uL (ref 145–400)
RBC: 4.46 MIL/uL (ref 3.70–5.45)
RDW: 13 % (ref 11.2–14.5)
WBC: 4 10*3/uL (ref 3.9–10.3)

## 2017-10-26 LAB — COMPREHENSIVE METABOLIC PANEL
ALT: 11 U/L (ref 0–44)
AST: 13 U/L — ABNORMAL LOW (ref 15–41)
Albumin: 4 g/dL (ref 3.5–5.0)
Alkaline Phosphatase: 72 U/L (ref 38–126)
Anion gap: 8 (ref 5–15)
BUN: 8 mg/dL (ref 6–20)
CALCIUM: 9.3 mg/dL (ref 8.9–10.3)
CHLORIDE: 107 mmol/L (ref 98–111)
CO2: 27 mmol/L (ref 22–32)
CREATININE: 1 mg/dL (ref 0.44–1.00)
Glucose, Bld: 86 mg/dL (ref 70–99)
Potassium: 4.5 mmol/L (ref 3.5–5.1)
SODIUM: 142 mmol/L (ref 135–145)
Total Bilirubin: 0.4 mg/dL (ref 0.3–1.2)
Total Protein: 6.9 g/dL (ref 6.5–8.1)

## 2017-10-26 LAB — RESEARCH LABS

## 2017-10-26 NOTE — Progress Notes (Signed)
10/26/2017 Patient in to clinic today for Month 24 lab work. Upon arrival to clinic, fasting lab samples were collected for research purposes, along with standard of care labs and CA-125, following a fast of at least eight hours. Patient then began completing the required questionnaires for today's visit, including the AFFQ, APAQ and GOG RAND-36/GSRS-IBS questionnaires. Height and weight were obtained without shoes, in lightweight indoor clothing. Waist measurement was obtained according to protocol section 4.6.1. The patient will return to the clinic on Thursday for completion of physical exam by Dr. Alvy Bimler. Thanked patient for her participation in the study and congratulated her on reaching this milestone. Cindy S. Brigitte Pulse BSN, RN, [place] 10/26/2017 2:23 PM

## 2017-10-27 LAB — CA 125: Cancer Antigen (CA) 125: 5.2 U/mL (ref 0.0–38.1)

## 2017-10-28 ENCOUNTER — Encounter: Payer: Self-pay | Admitting: Hematology and Oncology

## 2017-10-28 ENCOUNTER — Inpatient Hospital Stay: Payer: BLUE CROSS/BLUE SHIELD | Admitting: Hematology and Oncology

## 2017-10-28 DIAGNOSIS — Z1509 Genetic susceptibility to other malignant neoplasm: Secondary | ICD-10-CM

## 2017-10-28 DIAGNOSIS — Z8543 Personal history of malignant neoplasm of ovary: Secondary | ICD-10-CM | POA: Diagnosis not present

## 2017-10-28 DIAGNOSIS — C562 Malignant neoplasm of left ovary: Principal | ICD-10-CM

## 2017-10-28 DIAGNOSIS — Z9013 Acquired absence of bilateral breasts and nipples: Secondary | ICD-10-CM

## 2017-10-28 DIAGNOSIS — Z1501 Genetic susceptibility to malignant neoplasm of breast: Secondary | ICD-10-CM | POA: Diagnosis not present

## 2017-10-28 DIAGNOSIS — C561 Malignant neoplasm of right ovary: Secondary | ICD-10-CM

## 2017-10-28 DIAGNOSIS — C563 Malignant neoplasm of bilateral ovaries: Secondary | ICD-10-CM

## 2017-10-28 DIAGNOSIS — Z006 Encounter for examination for normal comparison and control in clinical research program: Secondary | ICD-10-CM | POA: Diagnosis not present

## 2017-10-28 NOTE — Assessment & Plan Note (Addendum)
She has no signs of cancer at present time Previously, we have discussed the roles of PARP inhibitor For now, there is no data to start in the absence of disease I educated the patient signs and symptoms of watch out for cancer recurrence She will continue future follow-up long-term with GYN oncologist only She has completed clinical trial

## 2017-10-28 NOTE — Progress Notes (Signed)
[facility] OFFICE PROGRESS NOTE  Patient Care Team: Shirline Frees, MD as PCP - General (Family Medicine)  ASSESSMENT & PLAN:  Bilateral primary ovarian cancer [facility]) She has no signs of cancer at present time Previously, we have discussed the roles of PARP inhibitor For now, there is no data to start in the absence of disease I educated the patient signs and symptoms of watch out for cancer recurrence She will continue future follow-up long-term with GYN oncologist only She has completed clinical trial   BRCA1 gene mutation positive The patient has completed bilateral mastectomy as primary prevention against risk of breast cancer. Outside surgical pathology was negative for malignancy Examination on her chest wall is benign   No orders of the defined types were placed in this encounter.   INTERVAL HISTORY: Please see below for problem oriented charting. She returns for further follow-up She has been feeling well She exercise on a regular basis She is motivated and have lost some weight She denies recent nausea, abdominal bloating or vaginal bleeding No changes in bowel habits  SUMMARY OF ONCOLOGIC HISTORY: Oncology History   ER patchy expression BRCA 1 mutation Participated in GOG 0225 study     Bilateral primary ovarian cancer ([place])   11/15/2014 Initial Diagnosis    She presented with abnormal heavy bleeding      01/25/2015 Imaging    US pelvis 1. Interval development of large solid and cystic masses in the adnexal regions bilaterally. Normal ovarian tissue is not identified. Findings are suspicious for malignancy. 2. Ascites. 3. Normal appearance of the uterus. 4. Primary or metastatic malignancy could have this appearance. 5. Normal endometrium.      01/28/2015 Tumor Marker    Patient's tumor was tested for the following markers: CA125. Results of the tumor marker test revealed 3142.      01/31/2015 Imaging    CT scan:  Large complex  cystic and solid mass in the pelvis and lower abdomen measuring 27 cm. This could represent separate bilateral cystic adnexal masses or a single large cystic pelvic mass. This is consistent with cystic ovarian carcinoma. Diffuse peritoneal carcinomatosis with mild ascites. Mild lymphadenopathy in bilateral cardiophrenic angles. Moderate right pleural effusion and atelectasis. Indeterminate 6 mm right middle lobe pulmonary nodule. Recommend attention on follow-up CT. Acute left pulmonary embolism.       01/31/2015 Pathology Results    Endometrium, biopsy - PROLIFERATIVE/EARLY SECRETORY (INTERVAL PHASE) ENDOMETRIUM. - NO HYPERPLASIA OR MALIGNANCY      02/01/2015 Surgery    Successful ultrasound guided paracentesis yielding 800 mL of ascites      02/01/2015 Pathology Results    PERITONEAL/ASCITIC FLUID(SPECIMEN 1 OF 2 COLLECTED 02/01/15): MALIGNANT CELLS CONSISTENT WITH HIGH GRADE SEROUS CARCINOMA.      02/01/2015 Pathology Results    PLEURAL FLUID, RIGHT(SPECIMEN 2 OF 2 COLLECTED 02/01/15): MALIGNANT CELLS PRESENT CONSISTENT WITH HIGH GRADE CARCINOMA      02/01/2015 Procedure    Successful ultrasound guided right thoracentesis yielding 800 mL of pleural fluid      02/12/2015 - 06/24/2015 Chemotherapy    She received 6 cycles of carboplatin and Taxol      02/18/2015 Tumor Marker    Patient's tumor was tested for the following markers: CA125. Results of the tumor marker test revealed 713      02/28/2015 Genetic Testing    Patient has genetic testing done for BRCA mutation. Results revealed patient has the following mutation(s): BRCA 1 mutation      02/28/2015  Tumor Marker    Patient's tumor was tested for the following markers: CA125. Results of the tumor marker test revealed 3682      03/15/2015 Imaging    No free ascites identified in the pelvis to warrant paracentesis. The large loculated complex cystic and solid masses in the pelvis were again identified by ultrasound.       03/29/2015 Imaging    Ct abdomen:  1. The dominant cystic mass within the abdomen and pelvis is not significantly changed in size when compared with previous exam. Decrease in peritoneal nodularity and soft tissue infiltration. Interval decrease in volume of abdominal and pelvic ascites. 2. There has been decrease in volume of right pleural effusion with resolution of right middle lobe lung nodule. 3. No new or progressive disease identified      04/16/2015 Pathology Results    1. Adnexa - ovary +/- tube, neoplastic, right - HIGH GRADE CARCINOMA WITH SEROUS FEATURES INVOLVING OVARY, SEE COMMENT. - BENIGN FALLOPIAN TUBE; NEGATIVE FOR ATYPIA AND MALIGNANCY. 2. Adnexa - ovary +/- tube, neoplastic, left - HIGH GRADE CARCINOMA WITH SEROUS FEATURES INVOLVING OVARY, SEE COMMENT. - BENIGN FALLOPIAN TUBE; NEGATIVE FOR ATYPIA AND MALIGNANCY. 3. Uterus +/- tubes/ovaries, neoplastic, cervix - PROLIFERATIVE PHASE ENDOMETRIUM; NEGATIVE FOR HYPERPLASIA AND MALIGNANCY. - BENIGN CERVICAL MUCOSA; NEGATIVE FOR INTRAEPITHELIAL LESION OR MALIGNANCY. - UNREMARKABLE UTERINE SEROSA. - BENIGN PARA-UTERAL FIBROVASCULAR AND ADIPOSE SOFT TISSUE; NEGATIVE FOR ATYPIA AND MALIGNANCY. 4. Omentum, resection for tumor - POSITIVE FOR METASTATIC HIGH GRADE CARCINOMA, SEE COMMENT.  Representative section of tumor from part 2 demonstrates the following immunophenotype: cytokeratin 7 - strong diffuse expression. cytokeratin 20 - negative expression. estrogen receptor - patchy mild to moderate expression. p16 - negative expression. p63 - diffuse moderate strong expression. vimentin - strong diffuse expression. CDX2 - negative expression. WT-1 - patchy moderate strong expression. Overall the morphology and immunophenotype are that of high grade carcinoma with serous features (grade 4 serous carcinoma). Given that both ovaries demonstrate large masses of similar size, it is uncertain as to which of the two ovaries represent  the primary site.   4. Representative sections from the submitted omental tissue demonstrate multiple foci of metastatic carcinoma associated with fibroinflammatory tissue reaction consistent with invasive omental implants.      04/16/2015 Surgery    Procedure(s) Performed: Exploratory laparotomy with total abdominal hysterectomy bilateral salpingo-oophorectomy, omentectomy radical tumor debulking for ovarian cancer Operative Findings: 20cm right ovarian cystic neoplasma, left ovary with tumor measuring approximately 10cm. Tumors non-adherent to pelvic structures. No peritoneal nodularity or plaques, grossly normal omentum. No enlarged lymph nodes. Small volume (100cc) ascites. Normal diaphragms.  This represented an optimal cytoreduction (R0) with no gross visible disease remaining (in the peritoneal cavity).      05/31/2015 Tumor Marker    Patient's tumor was tested for the following markers: CA125. Results of the tumor marker test revealed 11.7      07/22/2015 Imaging    Ct abdomen:  Minimal residual peritoneal thickening in the pelvis, without evidence of discrete masses or ascites. This favors post treatment changes, although a minimal residual peritoneal metastatic disease cannot definitely be excluded. Recommend attention on follow-up imaging. No other sites of metastatic disease identified within the abdomen or pelvis. Incidentally noted abnormal IVC filter location within the right gonadal vein along the lateral wall of the IVC.       08/20/2015 Procedure    1. Successful fluoroscopic guided removal of infrarenal IVC filter.  2. Normal inferior venacavagram.  01/29/2016 Surgery    She underwent bilateral simple mastectomy      01/29/2016 Pathology Results    She has negative surgical pathology in her breast      04/08/2016 Surgery    She underwent bilateral breast reconstruction surgery      11/12/2016 Imaging    CT scan of abdomen:  Re- demonstrated minimal  peritoneal thickening within the pelvis without discrete nodularity, potentially post treatment changes. Recommend attention on followup. No evidence for additional sites of metastatic disease within the abdomen or pelvis.      11/12/2016 Tumor Marker    Patient's tumor was tested for the following markers: CA125. Results of the tumor marker test revealed 4.8      05/05/2017 Imaging    1. No evidence of metastatic disease. 2. Trace fluid adjacent to the rectosigmoid colon, decreased from prior, and likely postoperative in etiology. 3. Grade 2 anterolisthesis of L5 on S1 with advanced secondary degenerative disc disease.       REVIEW OF SYSTEMS:   Constitutional: Denies fevers, chills or abnormal weight loss Eyes: Denies blurriness of vision Ears, nose, mouth, throat, and face: Denies mucositis or sore throat Respiratory: Denies cough, dyspnea or wheezes Cardiovascular: Denies palpitation, chest discomfort or lower extremity swelling Gastrointestinal:  Denies nausea, heartburn or change in bowel habits Skin: Denies abnormal skin rashes Lymphatics: Denies new lymphadenopathy or easy bruising Neurological:Denies numbness, tingling or new weaknesses Behavioral/Psych: Mood is stable, no new changes  All other systems were reviewed with the patient and are negative.  I have reviewed the past medical history, past surgical history, social history and family history with the patient and they are unchanged from previous note.  ALLERGIES:  is allergic to [employer] tartrate]; compazine [prochlorperazine edisylate]; and tylox [oxycodone-acetaminophen].  MEDICATIONS:  Current Outpatient Medications  Medication Sig Dispense Refill  . Acetaminophen 500 MG coapsule Take by mouth.    Marland Kitchen aspirin EC 81 MG tablet Take 81 mg by mouth daily.    . Cholecalciferol (VITAMIN D) 2000 UNITS tablet Take 4,000 Units by mouth daily.    Marland Kitchen docusate sodium (COLACE) 100 MG capsule Take 100 mg by mouth 2 (two)  times daily.    Marland Kitchen estradiol (ESTRACE) 0.5 MG tablet TAKE 2 TABLETS (1 MG TOTAL) BY MOUTH DAILY. 60 tablet 9  . hydrOXYzine (ATARAX/VISTARIL) 25 MG tablet Take 50 mg by mouth at bedtime.     Marland Kitchen LORazepam (ATIVAN) 1 MG tablet 1 SL or PO every 6 hours as needed for nausea. May use 1-2 tablets  HS as needed 30 tablet 0  . Melatonin 5 MG TABS Take 5 mg by mouth.    . montelukast (SINGULAIR) 10 MG tablet Take 10 mg by mouth at bedtime.    . naproxen (NAPROSYN) 500 MG tablet TAKE 1 TABLET BY MOUTH EVERY 12 HOURS AS NEEDED FOR PAIN .TAKE WITH FOOD OR MILK  3  . pantoprazole (PROTONIX) 40 MG tablet Take 40 mg by mouth daily.    Marland Kitchen PREVIDENT 5000 BOOSTER PLUS 1.1 % PSTE Reported on 09/12/2015  5  . triamcinolone cream (KENALOG) 0.1 % For exzema    . venlafaxine (EFFEXOR) 100 MG tablet Take 100 mg by mouth 2 (two) times daily.     No current facility-administered medications for this visit.     PHYSICAL EXAMINATION: ECOG PERFORMANCE STATUS: 0 - Asymptomatic  Vitals:   10/28/17 1335  BP: 111/62  Pulse: 81  Resp: 18  Temp: 98.1 F (36.7 C)  SpO2:  99%   Filed Weights   10/28/17 1335  Weight: 145 lb 3 oz (65.9 kg)    GENERAL:alert, no distress and comfortable SKIN: skin color, texture, turgor are normal, no rashes or significant lesions EYES: normal, Conjunctiva are pink and non-injected, sclera clear OROPHARYNX:no exudate, no erythema and lips, buccal mucosa, and tongue normal  NECK: supple, thyroid normal size, non-tender, without nodularity LYMPH:  no palpable lymphadenopathy in the cervical, axillary or inguinal LUNGS: clear to auscultation and percussion with normal breathing effort HEART: regular rate & rhythm and no murmurs and no lower extremity edema ABDOMEN:abdomen soft, non-tender and normal bowel sounds Musculoskeletal:no cyanosis of digits and no clubbing  NEURO: alert & oriented x 3 with fluent speech, no focal motor/sensory deficits  LABORATORY DATA:  I have reviewed the  data as listed    Component Value Date/Time   NA 142 10/26/2017 1156   NA 141 05/05/2017 0949   K 4.5 10/26/2017 1156   K 4.0 05/05/2017 0949   CL 107 10/26/2017 1156   CO2 27 10/26/2017 1156   CO2 29 05/05/2017 0949   GLUCOSE 86 10/26/2017 1156   GLUCOSE 90 05/05/2017 0949   BUN 8 10/26/2017 1156   BUN 11.7 05/05/2017 0949   CREATININE 1.00 10/26/2017 1156   CREATININE 1.0 05/05/2017 0949   CALCIUM 9.3 10/26/2017 1156   CALCIUM 8.9 05/05/2017 0949   PROT 6.9 10/26/2017 1156   PROT 6.7 05/05/2017 0949   ALBUMIN 4.0 10/26/2017 1156   ALBUMIN 3.7 05/05/2017 0949   AST 13 (L) 10/26/2017 1156   AST 19 05/05/2017 0949   ALT 11 10/26/2017 1156   ALT 18 05/05/2017 0949   ALKPHOS 72 10/26/2017 1156   ALKPHOS 65 05/05/2017 0949   BILITOT 0.4 10/26/2017 1156   BILITOT 0.32 05/05/2017 0949   GFRNONAA >60 10/26/2017 1156   GFRAA >60 10/26/2017 1156    No results found for: SPEP, UPEP  Lab Results  Component Value Date   WBC 4.0 10/26/2017   NEUTROABS 2.1 10/26/2017   HGB 13.1 10/26/2017   HCT 40.5 10/26/2017   MCV 90.8 10/26/2017   PLT 200 10/26/2017      Chemistry      Component Value Date/Time   NA 142 10/26/2017 1156   NA 141 05/05/2017 0949   K 4.5 10/26/2017 1156   K 4.0 05/05/2017 0949   CL 107 10/26/2017 1156   CO2 27 10/26/2017 1156   CO2 29 05/05/2017 0949   BUN 8 10/26/2017 1156   BUN 11.7 05/05/2017 0949   CREATININE 1.00 10/26/2017 1156   CREATININE 1.0 05/05/2017 0949      Component Value Date/Time   CALCIUM 9.3 10/26/2017 1156   CALCIUM 8.9 05/05/2017 0949   ALKPHOS 72 10/26/2017 1156   ALKPHOS 65 05/05/2017 0949   AST 13 (L) 10/26/2017 1156   AST 19 05/05/2017 0949   ALT 11 10/26/2017 1156   ALT 18 05/05/2017 0949   BILITOT 0.4 10/26/2017 1156   BILITOT 0.32 05/05/2017 0949      All questions were answered. The patient knows to call the clinic with any problems, questions or concerns. No barriers to learning was detected.  I spent 10  minutes counseling the patient face to face. The total time spent in the appointment was 15 minutes and more than 50% was on counseling and review of test results  Heath Lark, MD 10/28/2017 2:01 PM

## 2017-10-28 NOTE — Assessment & Plan Note (Signed)
The patient has completed bilateral mastectomy as primary prevention against risk of breast cancer. Outside surgical pathology was negative for malignancy Examination on her chest wall is benign

## 2017-11-02 ENCOUNTER — Other Ambulatory Visit: Payer: BLUE CROSS/BLUE SHIELD

## 2017-11-02 ENCOUNTER — Encounter: Payer: BLUE CROSS/BLUE SHIELD | Admitting: *Deleted

## 2017-11-03 NOTE — Progress Notes (Signed)
10/28/2017 Patient returned to clinic for physician visit on 10/28/2017 following collection of other study assessments (labs, questionnaires and measurements) on 10/26/2017. Per MD, no imaging tests were indicated at this follow-up visit, and patient is being released from further medical oncology visits, unless needed. Patient denies any occurrences of adverse events since the Month 21 visit (see also today's MD note). Patient is aware that ongoing periodic reports of her status will be submitted per protocol requirements, and that she may be contacted by phone for follow-up, if necessary. Thanked patient for her participation. Cindy S. Brigitte Pulse BSN, RN, [facility] 10/28/2017 3:56 PM

## 2017-11-05 ENCOUNTER — Ambulatory Visit: Payer: BLUE CROSS/BLUE SHIELD | Admitting: Hematology and Oncology

## 2017-11-05 ENCOUNTER — Other Ambulatory Visit: Payer: BLUE CROSS/BLUE SHIELD

## 2017-11-11 DIAGNOSIS — F4322 Adjustment disorder with anxiety: Secondary | ICD-10-CM | POA: Diagnosis not present

## 2017-11-19 ENCOUNTER — Other Ambulatory Visit: Payer: Self-pay | Admitting: Gynecologic Oncology

## 2017-11-19 DIAGNOSIS — E2839 Other primary ovarian failure: Secondary | ICD-10-CM

## 2017-11-29 DIAGNOSIS — F4322 Adjustment disorder with anxiety: Secondary | ICD-10-CM | POA: Diagnosis not present

## 2017-12-15 ENCOUNTER — Telehealth: Payer: Self-pay | Admitting: Genetics

## 2017-12-15 NOTE — Telephone Encounter (Signed)
Asked patient if it is okay for [place] to discuss her genetic test results/family history with her daughter Eligah East who will be coming in for genetic testing.   Asked her to call back to let me know her decision.

## 2017-12-15 NOTE — Telephone Encounter (Signed)
Patient gave permission to discuss her genetic test results and any information obtained during her genetic counseling appointment with her daughter Darrick [employer] representative) Glenna Fellows.

## 2017-12-28 DIAGNOSIS — F4322 Adjustment disorder with anxiety: Secondary | ICD-10-CM | POA: Diagnosis not present

## 2018-01-25 DIAGNOSIS — F4322 Adjustment disorder with anxiety: Secondary | ICD-10-CM | POA: Diagnosis not present

## 2018-02-10 DIAGNOSIS — F4322 Adjustment disorder with anxiety: Secondary | ICD-10-CM | POA: Diagnosis not present

## 2018-02-22 DIAGNOSIS — F4322 Adjustment disorder with anxiety: Secondary | ICD-10-CM | POA: Diagnosis not present

## 2018-04-15 ENCOUNTER — Inpatient Hospital Stay: Payer: BLUE CROSS/BLUE SHIELD | Attending: Gynecologic Oncology

## 2018-04-15 ENCOUNTER — Encounter: Payer: Self-pay | Admitting: Gynecologic Oncology

## 2018-04-15 ENCOUNTER — Inpatient Hospital Stay (HOSPITAL_BASED_OUTPATIENT_CLINIC_OR_DEPARTMENT_OTHER): Payer: BLUE CROSS/BLUE SHIELD | Admitting: Gynecologic Oncology

## 2018-04-15 VITALS — BP 117/74 | HR 66 | Temp 97.6°F | Resp 16 | Ht 63.0 in | Wt 148.0 lb

## 2018-04-15 DIAGNOSIS — Z9221 Personal history of antineoplastic chemotherapy: Secondary | ICD-10-CM | POA: Diagnosis not present

## 2018-04-15 DIAGNOSIS — C561 Malignant neoplasm of right ovary: Secondary | ICD-10-CM | POA: Diagnosis not present

## 2018-04-15 DIAGNOSIS — C562 Malignant neoplasm of left ovary: Secondary | ICD-10-CM | POA: Insufficient documentation

## 2018-04-15 DIAGNOSIS — Z90722 Acquired absence of ovaries, bilateral: Secondary | ICD-10-CM

## 2018-04-15 DIAGNOSIS — Z90710 Acquired absence of both cervix and uterus: Secondary | ICD-10-CM | POA: Insufficient documentation

## 2018-04-15 DIAGNOSIS — C563 Malignant neoplasm of bilateral ovaries: Secondary | ICD-10-CM

## 2018-04-15 NOTE — Progress Notes (Signed)
Consult Note: Gyn-Onc  Consult was requested by Dr. Kennon Rounds for the evaluation of Elizabeth Henry 47 y.o. female   CC:  Chief Complaint  Patient presents with  . Ovarian cancer, bilateral ([place])    Assessment/Plan:  Elizabeth Henry  is a 47 y.o.  year old with stage IV ovarian cancer in the setting of acute PE, s/p 3 cycles of neoadjuvant chemotherapy s/p ex lap, TAH, BSO, omentectomy, interval debulking to no macroscopic residual disease on 05/02/15. 3 additional cycles of chemotherapy with carboplatin and paclitaxel completed on 06/24/15.  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  BRCA 1 deleterious mutation positive. s/p risk reducing mastectomy.  She has had a complete response to therapy with no evidence of recurrence and an enduring remission for >3years.   Follow-up with me in 6 months. Counseled regarding symptoms of recurrence and to return to see me if these develop.    HPI: Elizabeth Henry is a very pleasant 47 year old G2P2 who was initially seen in consultation at the request of Dr Kennon Rounds for bilateral  Ovarian masses, ascites, elevated CA-125, and abnormal uterine bleeding. The patient began experiencing abdominal bloating and early satiety 6 weeks ago in July 2016. She since the end of August 2016 she has experienced vaginal bleeding every day that is light. She denies pain other than stretching pain in her abdomen. She's had some constipation but no change in the caliber of his stools and no blood in her bowel movements.  She was evaluated by Dr. Kennon Rounds on 01/25/2015. A pelvic and abdominal ultrasound scan were ordered and revealed large volume of ascites in the abdomen, bilateral ovarian masses with the right measuring 24.3 x 13.1 x 25.3 cm with solid and cystic components and blood flow within the solid components. The left ovary measured 14.5 x 12.7 x 14.8 cm and containing solid cystic components with increased blood  flow. The endometrial stripe was slightly thickened at 9 mm (cc patient is premenopausal). A CA-125 was drawn on 01/28/2015 and was elevated at 3142 units per milliliter.  A CT chest was performed on 01/31/15 which showed an acute PE and pleural effusions.  She was started on therapeutic lovenox.  She was started on neoadjuvant chemotherapy (due to her acute PE and the concern about discontinuing anticoagulation for surgery in the acute setting).  Prior to starting chemotherapy she underwent paracentesis and thoracentesis (right) both of which confirmed metastatic adencarcinoma consistent with gyn primary.  On 02/12/15 she began neoadjuvant chemotherapy with 3 cycles of paclitaxel and carboplatin.  She underwent BRCA deleterious testing which was positive for a BRCA 1 mutation.  CT on 03/29/15 showed interval resolution of ascites and significant improvement in upper abdominal disease with persistent ovarian cysts.  On 04/16/15 she underwent an ex lap, TAH, BSO, omentectomy and radical tumor debulking to no gross residual disease. She tolerated surgery well.  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  She went on to complete 3 additional adjuvant chemotherapy cycles with carboplatin and paclitaxel. She tolerated treatment well with no concerns. Last cycle of treatment was 06/24/15.  CT abdo/pelvis and chest on 07/22/15 showed no measurable residual disease. The IVC filter appeared to have migrated to the ovarian vessel.  She received a risk reducing mastectomy in September, 2017 at [facility].  Interval Hx:   CT scan (as part of trial protocol) in July, 2018 was negative for evidence  of disease recurrence (stable pelvic peritoneal thickening). CT scan (as part of trial protocol) in December, 2018 was negative for disease recurrence. She had a normal CA 125 of 6.1.  No bloating, pain, early satiety, edema, cough.    She does have low libido which is bothersome and easy bruising but she is on aspirin.  Her last CA 125 in June, 2019 was normal at 5.   Performance status is ECOG 0 - fully active.  Adverse event assessment negative for adverse events.  Current Meds:  Outpatient Encounter Medications as of 04/15/2018  Medication Sig  . Acetaminophen 500 MG coapsule Take by mouth.  Marland Kitchen aspirin EC 81 MG tablet Take 81 mg by mouth daily.  . Cholecalciferol (VITAMIN D) 2000 UNITS tablet Take 4,000 Units by mouth daily.  Marland Kitchen docusate sodium (COLACE) 100 MG capsule Take 100 mg by mouth 2 (two) times daily.  Marland Kitchen estradiol (ESTRACE) 0.5 MG tablet TAKE 2 TABLETS (1 MG TOTAL) BY MOUTH DAILY.  . hydrOXYzine (ATARAX/VISTARIL) 25 MG tablet Take 50 mg by mouth at bedtime.   Marland Kitchen LORazepam (ATIVAN) 1 MG tablet 1 SL or PO every 6 hours as needed for nausea. May use 1-2 tablets  HS as needed  . Melatonin 5 MG TABS Take 5 mg by mouth.  . montelukast (SINGULAIR) 10 MG tablet Take 10 mg by mouth at bedtime.  . naproxen (NAPROSYN) 500 MG tablet TAKE 1 TABLET BY MOUTH EVERY 12 HOURS AS NEEDED FOR PAIN .TAKE WITH FOOD OR MILK  . pantoprazole (PROTONIX) 40 MG tablet Take 40 mg by mouth daily.  Marland Kitchen PREVIDENT 5000 BOOSTER PLUS 1.1 % PSTE Reported on 09/12/2015  . triamcinolone cream (KENALOG) 0.1 % For exzema  . venlafaxine (EFFEXOR) 100 MG tablet Take 100 mg by mouth 2 (two) times daily.   No facility-administered encounter medications on file as of 04/15/2018.     Allergy:  Allergies  Allergen Reactions  . Ambien [Zolpidem Tartrate] Swelling  . Compazine [Prochlorperazine Edisylate] Hives  . Tylox [Oxycodone-Acetaminophen] Hives    Social Hx:   Social History   Socioeconomic History  . Marital status: Married    Spouse name: Coralyn Mark  . Number of children: 2  . Years of education: 81  . Highest education level: Not on file  Occupational History  . Occupation: Self-employed  Social Needs  . Financial resource strain: Not on  file  . Food insecurity:    Worry: Not on file    Inability: Not on file  . Transportation needs:    Medical: Not on file    Non-medical: Not on file  Tobacco Use  . Smoking status: Former Smoker    Packs/day: 1.00    Years: 10.00    Pack years: 10.00    Types: Cigarettes    Last attempt to quit: 10/18/2002    Years since quitting: 15.5  . Smokeless tobacco: Never Used  . Tobacco comment: smoked on and off for 10 yrs total - up to 1 ppd at most  Substance and Sexual Activity  . Alcohol use: No    Alcohol/week: 0.0 standard drinks    Comment: very rarely  . Drug use: No  . Sexual activity: Not Currently  Lifestyle  . Physical activity:    Days per week: Not on file    Minutes per session: Not on file  . Stress: Not on file  Relationships  . Social connections:    Talks on phone: Not on file    Gets together:  Not on file    Attends religious service: Not on file    Active member of club or organization: Not on file    Attends meetings of clubs or organizations: Not on file    Relationship status: Not on file  . Intimate partner violence:    Fear of current or ex partner: Not on file    Emotionally abused: Not on file    Physically abused: Not on file    Forced sexual activity: Not on file  Other Topics Concern  . Not on file  Social History Narrative   Fun: Go to [employer]; crochet   Denies religious beliefs effecting health care.     Past Surgical Hx:  Past Surgical History:  Procedure Laterality Date  . ABDOMINAL HYSTERECTOMY Bilateral 04/16/2015   Procedure: HYSTERECTOMY ABDOMINAL TOTAL, EXPLORATORY LAPAROTOMY, BILATERAL SALPINGO OOPHERECTOMY, OMENTECTOMY, DEBULKING;  Surgeon: Everitt Amber, MD;  Location: [facility];  Service: Gynecology;  Laterality: Bilateral;  . RECONSTRUCTION BREAST IMMEDIATE / DELAYED W/ TISSUE EXPANDER    . SIMPLE MASTECTOMY    . WISDOM TOOTH EXTRACTION      Past Medical Hx:  Past Medical History:  Diagnosis Date  . Allergy   . Anxiety    . Cancer ([place])   . GERD (gastroesophageal reflux disease)   . Migraines   . Pneumonia    bronchialpneumonia circa 9449  . Pulmonary embolism [facility]) October 2016  . Strep throat     Past Gynecological History:  Remote hx of abn paps.  Patient's last menstrual period was 01/08/2015 (approximate).  Family Hx:  Family History  Problem Relation Age of Onset  . Breast cancer Mother 12  . Healthy Father   . Breast cancer Maternal Grandmother        dx. 14 or younger  . Brain cancer Paternal Grandmother        unspecified tumor type  . HIV Brother   . Leukemia Paternal Uncle   . HIV Brother   . Alzheimer's disease Maternal Grandfather   . Diabetes Maternal Grandfather   . Breast cancer Other   . Cancer Paternal Uncle        unspecified type    Review of Systems:  Constitutional  Feels distended  ENT Normal appearing ears and nares bilaterally Skin/Breast  No rash, sores, jaundice, itching, dryness Cardiovascular  No chest pain, shortness of breath, or edema  Pulmonary  No cough or wheeze.  Gastro Intestinal  No nausea, vomitting, or diarrhoea.  Genito Urinary  No frequency, urgency, dysuria, no vaginal bleeding Musculo Skeletal  No myalgia, arthralgia, joint swelling or pain  Neurologic  No weakness, numbness, change in gait,  Psychology  No depression, anxiety, insomnia.   Vitals:  Blood pressure 117/74, pulse 66, temperature 97.6 F (36.4 C), temperature source Oral, resp. rate 16, height 5' 3"  (1.6 m), weight 148 lb (67.1 kg), last menstrual period 01/08/2015, SpO2 100 %.  Physical Exam: WD in NAD Neck  Supple NROM, without any enlargements.  Lymph Node Survey No cervical supraclavicular or inguinal adenopathy Cardiovascular  Pulse normal rate, regularity and rhythm. S1 and S2 normal.  Lungs  Clear to auscultation bilateraly, without wheezes/crackles/rhonchi. Good air movement.  Skin  No rash/lesions/breakdown  Psychiatry  Alert and oriented to  person, place, and time  Abdomen  Normoactive bowel sounds, abdomen non distended with well healed incision. No masses. Back No CVA tenderness Genito Urinary  Vulva/vagina: Normal external female genitalia.   No lesions. No discharge or bleeding.  Bladder/urethra:  No lesions or masses, well supported bladder  Vagina: cuff healing normally, in tact, no masses  Cervix and uterus: surgically absent  Adnexa: no masses. Rectal  Good tone, no masses no cul de sac nodularity.  Extremities  No bilateral cyanosis, clubbing or edema.   Thereasa Solo, MD  04/15/2018, 1:36 PM

## 2018-04-15 NOTE — Patient Instructions (Signed)
Please notify Dr Denman George at phone number [phone] if you notice vaginal bleeding, new pelvic or abdominal pains, bloating, feeling full easy, or a change in bladder or bowel function.   Please return to see Dr Denman George in 6 months for a lab check and physical exam.

## 2018-04-16 LAB — CA 125: CANCER ANTIGEN (CA) 125: 4.8 U/mL (ref 0.0–38.1)

## 2018-04-19 ENCOUNTER — Telehealth: Payer: Self-pay

## 2018-04-19 NOTE — Telephone Encounter (Signed)
Told Ms Califf that the CA-125 from 04-15-18 was normal at 4.8 per [name], NP.

## 2018-06-03 DIAGNOSIS — F4322 Adjustment disorder with anxiety: Secondary | ICD-10-CM | POA: Diagnosis not present

## 2018-06-21 ENCOUNTER — Telehealth: Payer: Self-pay | Admitting: *Deleted

## 2018-06-21 NOTE — Telephone Encounter (Signed)
Called and left the patient a message to call the office back. Need to move her appt from 6/4 to either 6/3 or 6/5 due to Dr. Denman George being out of the office

## 2018-06-23 ENCOUNTER — Telehealth: Payer: Self-pay | Admitting: *Deleted

## 2018-06-23 NOTE — Telephone Encounter (Signed)
Called and left the patient a message to call the office back. Need to move the appt from 6/4 to either 6/3 or 6/5

## 2018-06-30 DIAGNOSIS — F4322 Adjustment disorder with anxiety: Secondary | ICD-10-CM | POA: Diagnosis not present

## 2018-07-04 ENCOUNTER — Telehealth: Payer: Self-pay

## 2018-07-04 NOTE — Telephone Encounter (Signed)
Incoming call from pt returning our call  - per Michelle's note- need to reschedule pt for 6/3 instead of 6/4- pt agreeable to 6/3 at 1:30 pm. No other needs per pt at this time

## 2018-09-21 ENCOUNTER — Other Ambulatory Visit: Payer: Self-pay | Admitting: Gynecologic Oncology

## 2018-09-21 DIAGNOSIS — E2839 Other primary ovarian failure: Secondary | ICD-10-CM

## 2018-10-05 ENCOUNTER — Inpatient Hospital Stay: Payer: BC Managed Care – PPO | Attending: Gynecologic Oncology | Admitting: Gynecologic Oncology

## 2018-10-05 ENCOUNTER — Inpatient Hospital Stay: Payer: BC Managed Care – PPO

## 2018-10-05 ENCOUNTER — Encounter: Payer: Self-pay | Admitting: Gynecologic Oncology

## 2018-10-05 ENCOUNTER — Other Ambulatory Visit: Payer: Self-pay

## 2018-10-05 VITALS — BP 107/65 | HR 86 | Temp 98.5°F | Resp 18 | Ht 63.0 in | Wt 151.6 lb

## 2018-10-05 DIAGNOSIS — Z9221 Personal history of antineoplastic chemotherapy: Secondary | ICD-10-CM | POA: Insufficient documentation

## 2018-10-05 DIAGNOSIS — R61 Generalized hyperhidrosis: Secondary | ICD-10-CM

## 2018-10-05 DIAGNOSIS — C561 Malignant neoplasm of right ovary: Secondary | ICD-10-CM

## 2018-10-05 DIAGNOSIS — C563 Malignant neoplasm of bilateral ovaries: Secondary | ICD-10-CM

## 2018-10-05 DIAGNOSIS — Z90710 Acquired absence of both cervix and uterus: Secondary | ICD-10-CM

## 2018-10-05 DIAGNOSIS — C562 Malignant neoplasm of left ovary: Secondary | ICD-10-CM | POA: Diagnosis not present

## 2018-10-05 DIAGNOSIS — Z923 Personal history of irradiation: Secondary | ICD-10-CM | POA: Insufficient documentation

## 2018-10-05 DIAGNOSIS — Z90722 Acquired absence of ovaries, bilateral: Secondary | ICD-10-CM | POA: Diagnosis not present

## 2018-10-05 MED ORDER — GLYCOPYRRONIUM TOSYLATE 2.4 % EX PADS: 1.0000 "application " | MEDICATED_PAD | Freq: Every day | CUTANEOUS | 3 refills | Status: DC

## 2018-10-05 NOTE — Patient Instructions (Addendum)
Dr Denman George has prescribed a prescription strength antiperspirant wipe to apply once daily to the armpit area. It contains a medication that can cause blurred vision and dry mouth. If these side effects are bothersome (more bothersome than the perspiration) then it is reasonable to stop their use.  Please notify Dr Denman George at phone number [phone] if you notice vaginal bleeding, new pelvic or abdominal pains, bloating, feeling full easy, or a change in bladder or bowel function.   Please contact Dr Serita Grit office (at [phone]) in September, 2020 to request an appointment with her for December, 2020.

## 2018-10-05 NOTE — Progress Notes (Signed)
Consult Note: Gyn-Onc  Consult was requested by Dr. Kennon Rounds for the evaluation of Elgie Collard 48 y.o. female   CC:  Chief Complaint  Patient presents with  . Ovarian cancer, bilateral ([place])    Assessment/Plan:  Ms. SHEREL FENNELL  is a 48 y.o.  year old with stage IV ovarian cancer in the setting of acute PE, s/p 3 cycles of neoadjuvant chemotherapy s/p ex lap, TAH, BSO, omentectomy, interval debulking to no macroscopic residual disease on 05/02/15. 3 additional cycles of chemotherapy with carboplatin and paclitaxel completed on 06/24/15.  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  BRCA 1 deleterious mutation positive. s/p risk reducing mastectomy.  She has had a complete response to ovarian cancer therapy with no evidence of recurrence and an enduring remission for >3years.   Follow-up with me in 6 months. Counseled regarding symptoms of recurrence and to return to see me if these develop.   Hyperhidrosis - prescribed glycopyrronium   HPI: Areona Homer is a very pleasant 48 year old G2P2 who was initially seen in consultation at the request of Dr Kennon Rounds for bilateral  Ovarian masses, ascites, elevated CA-125, and abnormal uterine bleeding. The patient began experiencing abdominal bloating and early satiety 6 weeks ago in July 2016. She since the end of August 2016 she has experienced vaginal bleeding every day that is light. She denies pain other than stretching pain in her abdomen. She's had some constipation but no change in the caliber of his stools and no blood in her bowel movements.  She was evaluated by Dr. Kennon Rounds on 01/25/2015. A pelvic and abdominal ultrasound scan were ordered and revealed large volume of ascites in the abdomen, bilateral ovarian masses with the right measuring 24.3 x 13.1 x 25.3 cm with solid and cystic components and blood flow within the solid components. The left ovary measured 14.5 x 12.7 x 14.8 cm  and containing solid cystic components with increased blood flow. The endometrial stripe was slightly thickened at 9 mm (cc patient is premenopausal). A CA-125 was drawn on 01/28/2015 and was elevated at 3142 units per milliliter.  A CT chest was performed on 01/31/15 which showed an acute PE and pleural effusions.  She was started on therapeutic lovenox.  She was started on neoadjuvant chemotherapy (due to her acute PE and the concern about discontinuing anticoagulation for surgery in the acute setting).  Prior to starting chemotherapy she underwent paracentesis and thoracentesis (right) both of which confirmed metastatic adencarcinoma consistent with gyn primary.  On 02/12/15 she began neoadjuvant chemotherapy with 3 cycles of paclitaxel and carboplatin.  She underwent BRCA deleterious testing which was positive for a BRCA 1 mutation.  CT on 03/29/15 showed interval resolution of ascites and significant improvement in upper abdominal disease with persistent ovarian cysts.  On 04/16/15 she underwent an ex lap, TAH, BSO, omentectomy and radical tumor debulking to no gross residual disease. She tolerated surgery well.  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  She went on to complete 3 additional adjuvant chemotherapy cycles with carboplatin and paclitaxel. She tolerated treatment well with no concerns. Last cycle of treatment was 06/24/15.  CT abdo/pelvis and chest on 07/22/15 showed no measurable residual disease. The IVC filter appeared to have migrated to the ovarian vessel.  She received a risk reducing mastectomy in September, 2017 at [place].  Interval Hx:  CT scan (as part of trial protocol) in  July, 2018 was negative for evidence of disease recurrence (stable pelvic peritoneal thickening). CT scan (as part of trial protocol) in December, 2018 was negative for disease recurrence. She had a normal CA 125 of  6.1.  No bloating, pain, early satiety, edema, cough.   She does have low libido which is bothersome and easy bruising but she is on aspirin.  Her last CA 125 in December, 2019 was normal at 4.8.   Performance status is ECOG 0 - fully active.  Adverse event assessment negative for adverse events.  She reports the main symptom that is bothersome to her is hyperhidrosis.   Current Meds:  Outpatient Encounter Medications as of 10/05/2018  Medication Sig  . Acetaminophen 500 MG coapsule Take by mouth.  Marland Kitchen aspirin EC 81 MG tablet Take 81 mg by mouth daily.  . Cholecalciferol (VITAMIN D) 2000 UNITS tablet Take 4,000 Units by mouth daily.  Marland Kitchen docusate sodium (COLACE) 100 MG capsule Take 100 mg by mouth 2 (two) times daily.  Marland Kitchen estradiol (ESTRACE) 0.5 MG tablet TAKE 2 TABLETS (1 MG TOTAL) BY MOUTH DAILY.  . hydrOXYzine (ATARAX/VISTARIL) 25 MG tablet Take 50 mg by mouth at bedtime.   Marland Kitchen LORazepam (ATIVAN) 1 MG tablet Take 2 mg by mouth at bedtime.  . Melatonin 5 MG TABS Take 5 mg by mouth.  . montelukast (SINGULAIR) 10 MG tablet Take 10 mg by mouth at bedtime.  . naproxen (NAPROSYN) 500 MG tablet TAKE 1 TABLET BY MOUTH EVERY 12 HOURS AS NEEDED FOR PAIN .TAKE WITH FOOD OR MILK  . pantoprazole (PROTONIX) 40 MG tablet Take 40 mg by mouth daily.  Marland Kitchen PREVIDENT 5000 BOOSTER PLUS 1.1 % PSTE Reported on 09/12/2015  . triamcinolone cream (KENALOG) 0.1 % For exzema  . venlafaxine (EFFEXOR) 100 MG tablet Take 100 mg by mouth 2 (two) times daily.  . Glycopyrronium Tosylate 2.4 % PADS Apply 1 application topically at bedtime.  . [DISCONTINUED] LORazepam (ATIVAN) 1 MG tablet 1 SL or PO every 6 hours as needed for nausea. May use 1-2 tablets  HS as needed (Patient taking differently: Take 2 mg by mouth at bedtime. )   No facility-administered encounter medications on file as of 10/05/2018.     Allergy:  Allergies  Allergen Reactions  . Ambien [Zolpidem Tartrate] Swelling  . Compazine [Prochlorperazine  Edisylate] Hives  . Tylox [Oxycodone-Acetaminophen] Hives    Social Hx:   Social History   Socioeconomic History  . Marital status: Married    Spouse name: Coralyn Mark  . Number of children: 2  . Years of education: 57  . Highest education level: Not on file  Occupational History  . Occupation: Self-employed  Social Needs  . Financial resource strain: Not on file  . Food insecurity:    Worry: Not on file    Inability: Not on file  . Transportation needs:    Medical: Not on file    Non-medical: Not on file  Tobacco Use  . Smoking status: Former Smoker    Packs/day: 1.00    Years: 10.00    Pack years: 10.00    Types: Cigarettes    Last attempt to quit: 10/18/2002    Years since quitting: 15.9  . Smokeless tobacco: Never Used  . Tobacco comment: smoked on and off for 10 yrs total - up to 1 ppd at most  Substance and Sexual Activity  . Alcohol use: No    Alcohol/week: 0.0 standard drinks    Comment: very rarely  .  Drug use: No  . Sexual activity: Not Currently  Lifestyle  . Physical activity:    Days per week: Not on file    Minutes per session: Not on file  . Stress: Not on file  Relationships  . Social connections:    Talks on phone: Not on file    Gets together: Not on file    Attends religious service: Not on file    Active member of club or organization: Not on file    Attends meetings of clubs or organizations: Not on file    Relationship status: Not on file  . Intimate partner violence:    Fear of current or ex partner: Not on file    Emotionally abused: Not on file    Physically abused: Not on file    Forced sexual activity: Not on file  Other Topics Concern  . Not on file  Social History Narrative   Fun: Go to [employer]; crochet   Denies religious beliefs effecting health care.     Past Surgical Hx:  Past Surgical History:  Procedure Laterality Date  . ABDOMINAL HYSTERECTOMY Bilateral 04/16/2015   Procedure: HYSTERECTOMY ABDOMINAL TOTAL,  EXPLORATORY LAPAROTOMY, BILATERAL SALPINGO OOPHERECTOMY, OMENTECTOMY, DEBULKING;  Surgeon: Everitt Amber, MD;  Location: [facility];  Service: Gynecology;  Laterality: Bilateral;  . RECONSTRUCTION BREAST IMMEDIATE / DELAYED W/ TISSUE EXPANDER    . SIMPLE MASTECTOMY    . WISDOM TOOTH EXTRACTION      Past Medical Hx:  Past Medical History:  Diagnosis Date  . Allergy   . Anxiety   . Cancer ([place])   . GERD (gastroesophageal reflux disease)   . Migraines   . Pneumonia    bronchialpneumonia circa 2778  . Pulmonary embolism [facility]) October 2016  . Strep throat     Past Gynecological History:  Remote hx of abn paps.  Patient's last menstrual period was 01/08/2015 (approximate).  Family Hx:  Family History  Problem Relation Age of Onset  . Breast cancer Mother 78  . Healthy Father   . Breast cancer Maternal Grandmother        dx. 72 or younger  . Brain cancer Paternal Grandmother        unspecified tumor type  . HIV Brother   . Leukemia Paternal Uncle   . HIV Brother   . Alzheimer's disease Maternal Grandfather   . Diabetes Maternal Grandfather   . Breast cancer Other   . Cancer Paternal Uncle        unspecified type    Review of Systems:  Constitutional  Feels distended  ENT Normal appearing ears and nares bilaterally Skin/Breast  No rash, sores, jaundice, itching, dryness Cardiovascular  No chest pain, shortness of breath, or edema  Pulmonary  No cough or wheeze.  Gastro Intestinal  No nausea, vomitting, or diarrhoea.  Genito Urinary  No frequency, urgency, dysuria, no vaginal bleeding Musculo Skeletal  No myalgia, arthralgia, joint swelling or pain  Neurologic  No weakness, numbness, change in gait,  Psychology  No depression, anxiety, insomnia.   Vitals:  Blood pressure 107/65, pulse 86, temperature 98.5 F (36.9 C), temperature source Oral, resp. rate 18, height _0  (1.6 m), weight 151 lb 9.6 oz (68.8 kg), last menstrual period 01/08/2015, SpO2 98 %.  Physical  Exam: WD in NAD Neck  Supple NROM, without any enlargements.  Lymph Node Survey No cervical supraclavicular or inguinal adenopathy Cardiovascular  Pulse normal rate, regularity and rhythm. S1 and S2 normal.  Lungs  Clear to auscultation bilateraly, without wheezes/crackles/rhonchi. Good air movement.  Skin  No rash/lesions/breakdown  Psychiatry  Alert and oriented to person, place, and time  Abdomen  Normoactive bowel sounds, abdomen non distended with well healed incision. No masses. Back No CVA tenderness Genito Urinary  Vulva/vagina: Normal external female genitalia.   No lesions. No discharge or bleeding.  Bladder/urethra:  No lesions or masses, well supported bladder  Vagina: cuff healing normally, in tact, no masses  Cervix and uterus: surgically absent  Adnexa: no masses. Rectal  Good tone, no masses no cul de sac nodularity.  Extremities  No bilateral cyanosis, clubbing or edema.   Thereasa Solo, MD  10/05/2018, 2:18 PM

## 2018-10-06 ENCOUNTER — Encounter: Payer: Self-pay | Admitting: Gynecologic Oncology

## 2018-10-06 ENCOUNTER — Ambulatory Visit: Payer: BLUE CROSS/BLUE SHIELD | Admitting: Gynecologic Oncology

## 2018-10-06 ENCOUNTER — Telehealth: Payer: Self-pay | Admitting: Gynecologic Oncology

## 2018-10-06 ENCOUNTER — Other Ambulatory Visit: Payer: BLUE CROSS/BLUE SHIELD

## 2018-10-06 LAB — CA 125: Cancer Antigen (CA) 125: 4.6 U/mL (ref 0.0–38.1)

## 2018-10-06 NOTE — Telephone Encounter (Signed)
Attempted to call patient with CA 125 results. Unable to leave message. Will send mychart message.

## 2018-10-11 ENCOUNTER — Telehealth: Payer: Self-pay | Admitting: *Deleted

## 2018-10-11 NOTE — Telephone Encounter (Signed)
Patient called and left a message that she wanted her CA 125 results. Message forwarded to [facility] nurse navigator

## 2018-10-11 NOTE — Telephone Encounter (Signed)
Called Elizabeth Henry and advised her of CA 125 results.  She also mentioned that she is not able to get in to MyChart to see her results.

## 2018-10-14 DIAGNOSIS — G43009 Migraine without aura, not intractable, without status migrainosus: Secondary | ICD-10-CM | POA: Diagnosis not present

## 2018-10-14 DIAGNOSIS — F419 Anxiety disorder, unspecified: Secondary | ICD-10-CM | POA: Diagnosis not present

## 2018-10-14 DIAGNOSIS — J309 Allergic rhinitis, unspecified: Secondary | ICD-10-CM | POA: Diagnosis not present

## 2018-10-14 DIAGNOSIS — K219 Gastro-esophageal reflux disease without esophagitis: Secondary | ICD-10-CM | POA: Diagnosis not present

## 2018-12-19 DIAGNOSIS — Z20828 Contact with and (suspected) exposure to other viral communicable diseases: Secondary | ICD-10-CM | POA: Diagnosis not present

## 2019-02-16 ENCOUNTER — Telehealth: Payer: Self-pay | Admitting: Oncology

## 2019-02-16 NOTE — Telephone Encounter (Signed)
02/15/19 - Patient left me a message to return her phone call after she received the letter I mailed to her.  I returned patient's call to discuss follow-up to the GOG 0225 study that she is on.  The study wanted a 3 month visit and the patient was seen by the MD in June 2020 and the MD said that she could come back to see her in Dec. 2020.  This call was to check to make sure the patient is doing alright and not having any problems.  She stated she is doing good. No problems.  She has been doing some traveling and just returned from a trip.  She said that nothing has changed since her June visit.  I thanked the patient for her continued support in this clinical trial. Elizabeth Henry 02/16/19

## 2019-04-20 ENCOUNTER — Inpatient Hospital Stay (HOSPITAL_BASED_OUTPATIENT_CLINIC_OR_DEPARTMENT_OTHER): Payer: BC Managed Care – PPO | Admitting: Gynecologic Oncology

## 2019-04-20 ENCOUNTER — Inpatient Hospital Stay: Payer: BC Managed Care – PPO | Attending: Gynecologic Oncology

## 2019-04-20 ENCOUNTER — Encounter: Payer: Self-pay | Admitting: Gynecologic Oncology

## 2019-04-20 ENCOUNTER — Other Ambulatory Visit: Payer: Self-pay

## 2019-04-20 VITALS — BP 123/77 | HR 66 | Temp 97.9°F | Resp 18 | Ht 63.0 in | Wt 157.4 lb

## 2019-04-20 DIAGNOSIS — Z8543 Personal history of malignant neoplasm of ovary: Secondary | ICD-10-CM | POA: Diagnosis not present

## 2019-04-20 DIAGNOSIS — C561 Malignant neoplasm of right ovary: Secondary | ICD-10-CM

## 2019-04-20 DIAGNOSIS — Z1501 Genetic susceptibility to malignant neoplasm of breast: Secondary | ICD-10-CM | POA: Insufficient documentation

## 2019-04-20 DIAGNOSIS — Z90722 Acquired absence of ovaries, bilateral: Secondary | ICD-10-CM | POA: Insufficient documentation

## 2019-04-20 DIAGNOSIS — Z833 Family history of diabetes mellitus: Secondary | ICD-10-CM | POA: Insufficient documentation

## 2019-04-20 DIAGNOSIS — Z7982 Long term (current) use of aspirin: Secondary | ICD-10-CM | POA: Insufficient documentation

## 2019-04-20 DIAGNOSIS — Z9010 Acquired absence of unspecified breast and nipple: Secondary | ICD-10-CM | POA: Diagnosis not present

## 2019-04-20 DIAGNOSIS — Z86711 Personal history of pulmonary embolism: Secondary | ICD-10-CM | POA: Diagnosis not present

## 2019-04-20 DIAGNOSIS — Z808 Family history of malignant neoplasm of other organs or systems: Secondary | ICD-10-CM | POA: Diagnosis not present

## 2019-04-20 DIAGNOSIS — Z803 Family history of malignant neoplasm of breast: Secondary | ICD-10-CM | POA: Diagnosis not present

## 2019-04-20 DIAGNOSIS — Z87891 Personal history of nicotine dependence: Secondary | ICD-10-CM

## 2019-04-20 DIAGNOSIS — Z90710 Acquired absence of both cervix and uterus: Secondary | ICD-10-CM | POA: Insufficient documentation

## 2019-04-20 DIAGNOSIS — C563 Malignant neoplasm of bilateral ovaries: Secondary | ICD-10-CM

## 2019-04-20 DIAGNOSIS — Z9221 Personal history of antineoplastic chemotherapy: Secondary | ICD-10-CM

## 2019-04-20 NOTE — Progress Notes (Signed)
Follow-up Note: Gyn-Onc  Consult was requested by Dr. Kennon Rounds for the evaluation of Elizabeth Henry 48 y.o. female   CC:  Chief Complaint  Patient presents with  . Ovarian cancer, bilateral [facility])    Assessment/Plan:  Elizabeth Henry  is a 48 y.o.  year old with stage IV ovarian cancer in the setting of acute PE, s/p 3 cycles of neoadjuvant chemotherapy s/p ex lap, TAH, BSO, omentectomy, interval debulking to no macroscopic residual disease on 05/02/15. 3 additional cycles of chemotherapy with carboplatin and paclitaxel completed on 06/24/15.  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  BRCA 1 deleterious mutation positive. s/p risk reducing mastectomy.  She has had a complete response to ovarian cancer therapy with no evidence of recurrence and an enduring remission for >3years.   Follow-up with me in 6 months. Counseled regarding symptoms of recurrence and to return to see me if these develop.    HPI: Elizabeth Henry is a very pleasant 48 year old G2P2 who was initially seen in consultation at the request of Dr Kennon Rounds for bilateral  Ovarian masses, ascites, elevated CA-125, and abnormal uterine bleeding. The patient began experiencing abdominal bloating and early satiety 6 weeks ago in July 2016. She since the end of August 2016 she has experienced vaginal bleeding every day that is light. She denies pain other than stretching pain in her abdomen. She's had some constipation but no change in the caliber of his stools and no blood in her bowel movements.  She was evaluated by Dr. Kennon Rounds on 01/25/2015. A pelvic and abdominal ultrasound scan were ordered and revealed large volume of ascites in the abdomen, bilateral ovarian masses with the right measuring 24.3 x 13.1 x 25.3 cm with solid and cystic components and blood flow within the solid components. The left ovary measured 14.5 x 12.7 x 14.8 cm and containing solid cystic components with  increased blood flow. The endometrial stripe was slightly thickened at 9 mm (cc patient is premenopausal). A CA-125 was drawn on 01/28/2015 and was elevated at 3142 units per milliliter.  A CT chest was performed on 01/31/15 which showed an acute PE and pleural effusions.  She was started on therapeutic lovenox.  She was started on neoadjuvant chemotherapy (due to her acute PE and the concern about discontinuing anticoagulation for surgery in the acute setting).  Prior to starting chemotherapy she underwent paracentesis and thoracentesis (right) both of which confirmed metastatic adencarcinoma consistent with gyn primary.  On 02/12/15 she began neoadjuvant chemotherapy with 3 cycles of paclitaxel and carboplatin.  She underwent BRCA deleterious testing which was positive for a BRCA 1 mutation.  CT on 03/29/15 showed interval resolution of ascites and significant improvement in upper abdominal disease with persistent ovarian cysts.  On 04/16/15 she underwent an ex lap, TAH, BSO, omentectomy and radical tumor debulking to no gross residual disease. She tolerated surgery well.  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  She went on to complete 3 additional adjuvant chemotherapy cycles with carboplatin and paclitaxel. She tolerated treatment well with no concerns. Last cycle of treatment was 06/24/15.  CT abdo/pelvis and chest on 07/22/15 showed no measurable residual disease. The IVC filter appeared to have migrated to the ovarian vessel.  She received a risk reducing mastectomy in September, 2017 at [place].  Interval Hx:  CT scan (as part of trial protocol) in July, 2018 was negative for  evidence of disease recurrence (stable pelvic peritoneal thickening). CT scan (as part of trial protocol) in December, 2018 was negative for disease recurrence. She had a normal CA 125 of 6.1.  No bloating, pain, early satiety,  edema, cough.   She does have low libido which is bothersome and easy bruising but she is on aspirin.  Her last CA 125 in June, 2020 was normal at 4.6.  Performance status is ECOG 0 - fully active.  Adverse event assessment negative for adverse events.  Current Meds:  Outpatient Encounter Medications as of 04/20/2019  Medication Sig  . aspirin EC 81 MG tablet Take 81 mg by mouth daily.  . Cholecalciferol (VITAMIN D) 2000 UNITS tablet Take 4,000 Units by mouth daily.  Marland Kitchen docusate sodium (COLACE) 100 MG capsule Take 100 mg by mouth 2 (two) times daily.  Marland Kitchen estradiol (ESTRACE) 0.5 MG tablet TAKE 2 TABLETS (1 MG TOTAL) BY MOUTH DAILY.  . hydrOXYzine (ATARAX/VISTARIL) 25 MG tablet Take 50 mg by mouth at bedtime.   Marland Kitchen LORazepam (ATIVAN) 1 MG tablet Take 2 mg by mouth at bedtime.  . Melatonin 5 MG TABS Take 5 mg by mouth.  . montelukast (SINGULAIR) 10 MG tablet Take 10 mg by mouth at bedtime.  . naproxen (NAPROSYN) 500 MG tablet TAKE 1 TABLET BY MOUTH EVERY 12 HOURS AS NEEDED FOR PAIN .TAKE WITH FOOD OR MILK  . pantoprazole (PROTONIX) 40 MG tablet Take 40 mg by mouth daily.  Marland Kitchen PREVIDENT 5000 BOOSTER PLUS 1.1 % PSTE Reported on 09/12/2015  . triamcinolone cream (KENALOG) 0.1 % For exzema  . venlafaxine (EFFEXOR) 100 MG tablet Take 100 mg by mouth 2 (two) times daily.  . [DISCONTINUED] Acetaminophen 500 MG coapsule Take by mouth.  . [DISCONTINUED] Glycopyrronium Tosylate 2.4 % PADS Apply 1 application topically at bedtime.   No facility-administered encounter medications on file as of 04/20/2019.    Allergy:  Allergies  Allergen Reactions  . Ambien [Zolpidem Tartrate] Swelling  . Compazine [Prochlorperazine Edisylate] Hives  . Tylox [Oxycodone-Acetaminophen] Hives    Social Hx:   Social History   Socioeconomic History  . Marital status: Married    Spouse name: Coralyn Mark  . Number of children: 2  . Years of education: 53  . Highest education level: Not on file  Occupational History  .  Occupation: Self-employed  Tobacco Use  . Smoking status: Former Smoker    Packs/day: 1.00    Years: 10.00    Pack years: 10.00    Types: Cigarettes    Quit date: 10/18/2002    Years since quitting: 16.5  . Smokeless tobacco: Never Used  . Tobacco comment: smoked on and off for 10 yrs total - up to 1 ppd at most  Substance and Sexual Activity  . Alcohol use: No    Alcohol/week: 0.0 standard drinks    Comment: very rarely  . Drug use: No  . Sexual activity: Not Currently  Other Topics Concern  . Not on file  Social History Narrative   Fun: Go to [employer]; crochet   Denies religious beliefs effecting health care.    Social Determinants of Health   Financial Resource Strain:   . Difficulty of Paying Living Expenses: Not on file  Food Insecurity:   . Worried About Charity fundraiser in the Last Year: Not on file  . Ran Out of Food in the Last Year: Not on file  Transportation Needs:   . Lack of Transportation (Medical): Not on file  .  Lack of Transportation (Non-Medical): Not on file  Physical Activity:   . Days of Exercise per Week: Not on file  . Minutes of Exercise per Session: Not on file  Stress:   . Feeling of Stress : Not on file  Social Connections:   . Frequency of Communication with Friends and Family: Not on file  . Frequency of Social Gatherings with Friends and Family: Not on file  . Attends Religious Services: Not on file  . Active Member of Clubs or Organizations: Not on file  . Attends Archivist Meetings: Not on file  . Marital Status: Not on file  Intimate Partner Violence:   . Fear of Current or Ex-Partner: Not on file  . Emotionally Abused: Not on file  . Physically Abused: Not on file  . Sexually Abused: Not on file    Past Surgical Hx:  Past Surgical History:  Procedure Laterality Date  . ABDOMINAL HYSTERECTOMY Bilateral 04/16/2015   Procedure: HYSTERECTOMY ABDOMINAL TOTAL, EXPLORATORY LAPAROTOMY, BILATERAL SALPINGO OOPHERECTOMY,  OMENTECTOMY, DEBULKING;  Surgeon: Everitt Amber, MD;  Location: [facility];  Service: Gynecology;  Laterality: Bilateral;  . RECONSTRUCTION BREAST IMMEDIATE / DELAYED W/ TISSUE EXPANDER    . SIMPLE MASTECTOMY    . WISDOM TOOTH EXTRACTION      Past Medical Hx:  Past Medical History:  Diagnosis Date  . Allergy   . Anxiety   . Cancer ([place])   . GERD (gastroesophageal reflux disease)   . Migraines   . Pneumonia    bronchialpneumonia circa 8250  . Pulmonary embolism [facility]) October 2016  . Strep throat     Past Gynecological History:  Remote hx of abn paps.  Patient's last menstrual period was 01/08/2015 (approximate).  Family Hx:  Family History  Problem Relation Age of Onset  . Breast cancer Mother 24  . Healthy Father   . Breast cancer Maternal Grandmother        dx. 61 or younger  . Brain cancer Paternal Grandmother        unspecified tumor type  . HIV Brother   . Leukemia Paternal Uncle   . HIV Brother   . Alzheimer's disease Maternal Grandfather   . Diabetes Maternal Grandfather   . Breast cancer Other   . Cancer Paternal Uncle        unspecified type    Review of Systems:  Constitutional  Feels distended  ENT Normal appearing ears and nares bilaterally Skin/Breast  No rash, sores, jaundice, itching, dryness Cardiovascular  No chest pain, shortness of breath, or edema  Pulmonary  No cough or wheeze.  Gastro Intestinal  No nausea, vomitting, or diarrhoea.  Genito Urinary  No frequency, urgency, dysuria, no vaginal bleeding Musculo Skeletal  No myalgia, arthralgia, joint swelling or pain  Neurologic  No weakness, numbness, change in gait,  Psychology  No depression, anxiety, insomnia.   Vitals:  Blood pressure 123/77, pulse 66, temperature 97.9 F (36.6 C), temperature source Temporal, resp. rate 18, height '5\' 3"'  (1.6 m), weight 157 lb 6 oz (71.4 kg), last menstrual period 01/08/2015, SpO2 99 %.  Physical Exam: WD in NAD Neck  Supple NROM, without any  enlargements.  Lymph Node Survey No cervical supraclavicular or inguinal adenopathy Cardiovascular  Pulse normal rate, regularity and rhythm. S1 and S2 normal.  Lungs  Clear to auscultation bilateraly, without wheezes/crackles/rhonchi. Good air movement.  Skin  No rash/lesions/breakdown  Psychiatry  Alert and oriented to person, place, and time  Abdomen  Normoactive bowel sounds,  abdomen non distended with well healed incision. No masses. Back No CVA tenderness Genito Urinary  Vulva/vagina: Normal external female genitalia.   No lesions. No discharge or bleeding.  Bladder/urethra:  No lesions or masses, well supported bladder  Vagina: smooth, in tact, no masses  Cervix and uterus: surgically absent  Adnexa: no masses. Rectal  Good tone, no masses no cul de sac nodularity.  Extremities  No bilateral cyanosis, clubbing or edema.   Thereasa Solo, MD  04/20/2019, 11:57 AM

## 2019-04-20 NOTE — Patient Instructions (Signed)
Please notify Dr Jaston Havens at phone number [phone] if you notice vaginal bleeding, new pelvic or abdominal pains, bloating, feeling full easy, or a change in bladder or bowel function.   Dr Tessa Seaberry will see you back in 6 months.  

## 2019-04-21 ENCOUNTER — Telehealth: Payer: Self-pay

## 2019-04-21 LAB — CA 125: Cancer Antigen (CA) 125: 4.1 U/mL (ref 0.0–38.1)

## 2019-04-21 NOTE — Telephone Encounter (Signed)
I told Elizabeth Henry her Ca 125 was 4.1 which is normal. She verbalized understanding.

## 2019-07-24 ENCOUNTER — Other Ambulatory Visit: Payer: Self-pay | Admitting: Gynecologic Oncology

## 2019-07-24 DIAGNOSIS — E2839 Other primary ovarian failure: Secondary | ICD-10-CM

## 2019-10-11 ENCOUNTER — Other Ambulatory Visit: Payer: Self-pay

## 2019-10-11 ENCOUNTER — Inpatient Hospital Stay (HOSPITAL_BASED_OUTPATIENT_CLINIC_OR_DEPARTMENT_OTHER): Payer: BC Managed Care – PPO | Admitting: Gynecologic Oncology

## 2019-10-11 ENCOUNTER — Inpatient Hospital Stay: Payer: BC Managed Care – PPO | Attending: Gynecologic Oncology

## 2019-10-11 ENCOUNTER — Encounter: Payer: Self-pay | Admitting: Gynecologic Oncology

## 2019-10-11 VITALS — BP 108/63 | HR 90 | Temp 98.1°F | Resp 16 | Ht 63.0 in | Wt 159.4 lb

## 2019-10-11 DIAGNOSIS — Z9221 Personal history of antineoplastic chemotherapy: Secondary | ICD-10-CM | POA: Diagnosis not present

## 2019-10-11 DIAGNOSIS — Z90710 Acquired absence of both cervix and uterus: Secondary | ICD-10-CM

## 2019-10-11 DIAGNOSIS — Z148 Genetic carrier of other disease: Secondary | ICD-10-CM | POA: Insufficient documentation

## 2019-10-11 DIAGNOSIS — Z87891 Personal history of nicotine dependence: Secondary | ICD-10-CM | POA: Diagnosis not present

## 2019-10-11 DIAGNOSIS — Z90722 Acquired absence of ovaries, bilateral: Secondary | ICD-10-CM

## 2019-10-11 DIAGNOSIS — R1900 Intra-abdominal and pelvic swelling, mass and lump, unspecified site: Secondary | ICD-10-CM

## 2019-10-11 DIAGNOSIS — C561 Malignant neoplasm of right ovary: Secondary | ICD-10-CM | POA: Diagnosis not present

## 2019-10-11 DIAGNOSIS — C563 Malignant neoplasm of bilateral ovaries: Secondary | ICD-10-CM

## 2019-10-11 DIAGNOSIS — C562 Malignant neoplasm of left ovary: Secondary | ICD-10-CM

## 2019-10-11 DIAGNOSIS — Z4001 Encounter for prophylactic removal of breast: Secondary | ICD-10-CM | POA: Diagnosis not present

## 2019-10-11 DIAGNOSIS — Z9079 Acquired absence of other genital organ(s): Secondary | ICD-10-CM | POA: Insufficient documentation

## 2019-10-11 NOTE — Progress Notes (Signed)
Follow-up Note: Gyn-Onc  Consult was requested by Dr. Kennon Rounds for the evaluation of Elizabeth Henry 49 y.o. female   CC:  Chief Complaint  Patient presents with  . Ovarian cancer, bilateral ([place])    Assessment/Plan:  Elizabeth Henry  is a 49 y.o.  year old with stage IV ovarian cancer (BRCA1 positive) in the setting of acute PE, s/p 3 cycles of neoadjuvant chemotherapy s/p ex lap, TAH, BSO, omentectomy, interval debulking to no macroscopic residual disease on 05/02/15. 3 additional cycles of chemotherapy with carboplatin and paclitaxel completed on 06/24/15. Enduring complete response to primary therapy. 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  BRCA 1 deleterious mutation positive. s/p risk reducing mastectomy.  I am ordering a CT scan of the abdo and pelvis to evaluate the abdominal wall mass that she can feel. Additionally she is 4.5 years out from treatment and therefore a confirmatory CT scan is beneficial prior to transitioning out of surveillance care.   Follow-up with me in 6 months, after which time we will suspend cancer surveillance care as she will have met 5 year follow-up disease free. Counseled regarding symptoms of recurrence and to return to see me if these develop.    HPI: Elizabeth Henry is a very pleasant 49 year old G2P2 who was initially seen in consultation at the request of Dr Kennon Rounds for bilateral  Ovarian masses, ascites, elevated CA-125, and abnormal uterine bleeding. The patient began experiencing abdominal bloating and early satiety 6 weeks ago in July 2016. She since the end of August 2016 she has experienced vaginal bleeding every day that is light. She denies pain other than stretching pain in her abdomen. She's had some constipation but no change in the caliber of his stools and no blood in her bowel movements.  She was evaluated by Dr. Kennon Rounds on 01/25/2015. A pelvic and abdominal ultrasound scan were  ordered and revealed large volume of ascites in the abdomen, bilateral ovarian masses with the right measuring 24.3 x 13.1 x 25.3 cm with solid and cystic components and blood flow within the solid components. The left ovary measured 14.5 x 12.7 x 14.8 cm and containing solid cystic components with increased blood flow. The endometrial stripe was slightly thickened at 9 mm (cc patient is premenopausal). A CA-125 was drawn on 01/28/2015 and was elevated at 3142 units per milliliter.  A CT chest was performed on 01/31/15 which showed an acute PE and pleural effusions.  She was started on therapeutic lovenox.  She was started on neoadjuvant chemotherapy (due to her acute PE and the concern about discontinuing anticoagulation for surgery in the acute setting).  Prior to starting chemotherapy she underwent paracentesis and thoracentesis (right) both of which confirmed metastatic adencarcinoma consistent with gyn primary.  On 02/12/15 she began neoadjuvant chemotherapy with 3 cycles of paclitaxel and carboplatin.  She underwent BRCA deleterious testing which was positive for a BRCA 1 mutation.  CT on 03/29/15 showed interval resolution of ascites and significant improvement in upper abdominal disease with persistent ovarian cysts.  On 04/16/15 she underwent an ex lap, TAH, BSO, omentectomy and radical tumor debulking to no gross residual disease. She tolerated surgery well.  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  She went on to complete 3 additional adjuvant chemotherapy cycles with carboplatin and paclitaxel. She tolerated treatment well with no concerns. Last cycle of treatment was 06/24/15.  CT abdo/pelvis  and chest on 07/22/15 showed no measurable residual disease. The IVC filter appeared to have migrated to the ovarian vessel.  She received a risk reducing mastectomy in September, 2017 at [place].  Interval  Hx:  CT scan (as part of trial protocol) in July, 2018 was negative for evidence of disease recurrence (stable pelvic peritoneal thickening). CT scan (as part of trial protocol) in December, 2018 was negative for disease recurrence. She had a normal CA 125 of 6.1.  No bloating, pain, early satiety, edema, cough.   She does have low libido which is bothersome and easy bruising but she is on aspirin.  Her last CA 125 in December, 2020 was stable and normal at 4.1.  Performance status is ECOG 0 - fully active.  She reported the sensation of a small abdominal wall mass in the lower right abdomen with exercising.   Current Meds:  Outpatient Encounter Medications as of 10/11/2019  Medication Sig  . aspirin EC 81 MG tablet Take 81 mg by mouth daily.  . Cholecalciferol (VITAMIN D) 2000 UNITS tablet Take 4,000 Units by mouth daily.  Marland Kitchen docusate sodium (COLACE) 100 MG capsule Take 50 mg by mouth daily.   Marland Kitchen estradiol (ESTRACE) 0.5 MG tablet TAKE 2 TABLETS (1 MG TOTAL) BY MOUTH DAILY.  . hydrOXYzine (ATARAX/VISTARIL) 25 MG tablet Take 50 mg by mouth at bedtime.   Marland Kitchen LORazepam (ATIVAN) 1 MG tablet Take 2 mg by mouth at bedtime.  . Melatonin 5 MG TABS Take 5 mg by mouth.  . montelukast (SINGULAIR) 10 MG tablet Take 10 mg by mouth at bedtime.  . naproxen (NAPROSYN) 500 MG tablet TAKE 1 TABLET BY MOUTH EVERY 12 HOURS AS NEEDED FOR PAIN .TAKE WITH FOOD OR MILK  . pantoprazole (PROTONIX) 40 MG tablet Take 80 mg by mouth daily.   Marland Kitchen PREVIDENT 5000 BOOSTER PLUS 1.1 % PSTE Reported on 09/12/2015  . triamcinolone cream (KENALOG) 0.1 % For exzema  . venlafaxine (EFFEXOR) 100 MG tablet Take 100 mg by mouth 2 (two) times daily.   No facility-administered encounter medications on file as of 10/11/2019.    Allergy:  Allergies  Allergen Reactions  . Ambien [Zolpidem Tartrate] Swelling  . Compazine [Prochlorperazine Edisylate] Hives  . Tylox [Oxycodone-Acetaminophen] Hives    Social Hx:   Social History    Socioeconomic History  . Marital status: Married    Spouse name: Coralyn Mark  . Number of children: 2  . Years of education: 26  . Highest education level: Not on file  Occupational History  . Occupation: Self-employed  Tobacco Use  . Smoking status: Former Smoker    Packs/day: 1.00    Years: 10.00    Pack years: 10.00    Types: Cigarettes    Quit date: 10/18/2002    Years since quitting: 16.9  . Smokeless tobacco: Never Used  . Tobacco comment: smoked on and off for 10 yrs total - up to 1 ppd at most  Substance and Sexual Activity  . Alcohol use: No    Alcohol/week: 0.0 standard drinks    Comment: very rarely  . Drug use: No  . Sexual activity: Not Currently  Other Topics Concern  . Not on file  Social History Narrative   Fun: Go to [employer]; crochet   Denies religious beliefs effecting health care.    Social Determinants of Health   Financial Resource Strain:   . Difficulty of Paying Living Expenses:   Food Insecurity:   . Worried About Estate manager/land agent  of Food in the Last Year:   . [place] in the Last Year:   Transportation Needs:   . Lack of Transportation (Medical):   Marland Kitchen Lack of Transportation (Non-Medical):   Physical Activity:   . Days of Exercise per Week:   . Minutes of Exercise per Session:   Stress:   . Feeling of Stress :   Social Connections:   . Frequency of Communication with Friends and Family:   . Frequency of Social Gatherings with Friends and Family:   . Attends Religious Services:   . Active Member of Clubs or Organizations:   . Attends Archivist Meetings:   Marland Kitchen Marital Status:   Intimate Partner Violence:   . Fear of Current or Ex-Partner:   . Emotionally Abused:   Marland Kitchen Physically Abused:   . Sexually Abused:     Past Surgical Hx:  Past Surgical History:  Procedure Laterality Date  . ABDOMINAL HYSTERECTOMY Bilateral 04/16/2015   Procedure: HYSTERECTOMY ABDOMINAL TOTAL, EXPLORATORY LAPAROTOMY, BILATERAL SALPINGO OOPHERECTOMY,  OMENTECTOMY, DEBULKING;  Surgeon: Everitt Amber, MD;  Location: [facility];  Service: Gynecology;  Laterality: Bilateral;  . RECONSTRUCTION BREAST IMMEDIATE / DELAYED W/ TISSUE EXPANDER    . SIMPLE MASTECTOMY    . WISDOM TOOTH EXTRACTION      Past Medical Hx:  Past Medical History:  Diagnosis Date  . Allergy   . Anxiety   . Cancer ([place])   . GERD (gastroesophageal reflux disease)   . Migraines   . Pneumonia    bronchialpneumonia circa 8338  . Pulmonary embolism [facility]) October 2016  . Strep throat     Past Gynecological History:  Remote hx of abn paps.  Patient's last menstrual period was 01/08/2015 (approximate).  Family Hx:  Family History  Problem Relation Age of Onset  . Breast cancer Mother 48  . Healthy Father   . Breast cancer Maternal Grandmother        dx. 62 or younger  . Brain cancer Paternal Grandmother        unspecified tumor type  . HIV Brother   . Leukemia Paternal Uncle   . HIV Brother   . Alzheimer's disease Maternal Grandfather   . Diabetes Maternal Grandfather   . Breast cancer Other   . Cancer Paternal Uncle        unspecified type    Review of Systems:  Constitutional  Feels distended  ENT Normal appearing ears and nares bilaterally Skin/Breast  No rash, sores, jaundice, itching, dryness Cardiovascular  No chest pain, shortness of breath, or edema  Pulmonary  No cough or wheeze.  Gastro Intestinal  No nausea, vomitting, or diarrhoea.  Genito Urinary  No frequency, urgency, dysuria, no vaginal bleeding Musculo Skeletal  No myalgia, arthralgia, joint swelling or pain  Neurologic  No weakness, numbness, change in gait,  Psychology  No depression, anxiety, insomnia.   Vitals:  Blood pressure 108/63, pulse 90, temperature 98.1 F (36.7 C), temperature source Oral, resp. rate 16, height 5' 3"  (1.6 m), weight 159 lb 6.4 oz (72.3 kg), last menstrual period 01/08/2015, SpO2 100 %.  Physical Exam: WD in NAD Neck  Supple NROM, without any  enlargements.  Lymph Node Survey No cervical supraclavicular or inguinal adenopathy Cardiovascular  Pulse normal rate, regularity and rhythm. S1 and S2 normal.  Lungs  Clear to auscultation bilateraly, without wheezes/crackles/rhonchi. Good air movement.  Skin  No rash/lesions/breakdown  Psychiatry  Alert and oriented to person, place, and time  Abdomen  Normoactive bowel sounds, abdomen non distended with well healed incision. No masses. Back No CVA tenderness Genito Urinary  Vulva/vagina: Normal external female genitalia.   No lesions. No discharge or bleeding.  Bladder/urethra:  No lesions or masses, well supported bladder  Vagina: smooth, in tact, no masses  Cervix and uterus: surgically absent  Adnexa: no masses. Rectal  Good tone, no masses no cul de sac nodularity.  Extremities  No bilateral cyanosis, clubbing or edema.   Thereasa Solo, MD  10/11/2019, 1:38 PM

## 2019-10-11 NOTE — Patient Instructions (Addendum)
Dr Denman George is ordering a CT scan to evaluate the abdominal wall mass that you have felt.  Based on her exam she has a low suspicion for cancer.  Please return to see her in December after which you will transition out of surveillance for your ovarian cancer and can follow-up annually with Dr Kenton Kingfisher for general wellness care.

## 2019-10-12 LAB — CA 125: Cancer Antigen (CA) 125: 4.2 U/mL (ref 0.0–38.1)

## 2019-10-13 ENCOUNTER — Telehealth: Payer: Self-pay

## 2019-10-13 NOTE — Telephone Encounter (Signed)
Told Elizabeth Henry  That her tumor marker was stable and with in normal limits at 4.2 per [name], NP.

## 2019-11-13 ENCOUNTER — Telehealth: Payer: Self-pay | Admitting: *Deleted

## 2019-11-13 NOTE — Telephone Encounter (Signed)
Returned the patient's call and left a message with the date/time of her CT scan per her request

## 2019-11-13 NOTE — Telephone Encounter (Signed)
Patient called back and was given the instructions for the contrast

## 2019-11-15 ENCOUNTER — Ambulatory Visit (HOSPITAL_COMMUNITY)
Admission: RE | Admit: 2019-11-15 | Discharge: 2019-11-15 | Disposition: A | Discharge status: Home / Self Care | Payer: BC Managed Care – PPO | Source: Ambulatory Visit | Attending: Gynecologic Oncology | Admitting: Gynecologic Oncology

## 2019-11-15 ENCOUNTER — Other Ambulatory Visit: Payer: Self-pay

## 2019-11-15 ENCOUNTER — Encounter (HOSPITAL_COMMUNITY): Payer: Self-pay

## 2019-11-15 ENCOUNTER — Telehealth: Payer: Self-pay | Admitting: Oncology

## 2019-11-15 DIAGNOSIS — C563 Malignant neoplasm of bilateral ovaries: Secondary | ICD-10-CM

## 2019-11-15 DIAGNOSIS — C562 Malignant neoplasm of left ovary: Secondary | ICD-10-CM | POA: Diagnosis present

## 2019-11-15 DIAGNOSIS — C561 Malignant neoplasm of right ovary: Secondary | ICD-10-CM | POA: Insufficient documentation

## 2019-11-15 MED ORDER — IOHEXOL 300 MG/ML  SOLN
100.0000 mL | Freq: Once | INTRAMUSCULAR | Status: AC | PRN
  Administered 2019-11-15: 100 mL via INTRAVENOUS

## 2019-11-15 MED ORDER — SODIUM CHLORIDE (PF) 0.9 % IJ SOLN
INTRAMUSCULAR | Status: AC
  Filled 2019-11-15: qty 50

## 2019-11-15 NOTE — Telephone Encounter (Signed)
Called [place] and advised her of CT results.  She verbalized understanding and agreement.

## 2020-01-01 DIAGNOSIS — F419 Anxiety disorder, unspecified: Secondary | ICD-10-CM | POA: Diagnosis not present

## 2020-01-01 DIAGNOSIS — K219 Gastro-esophageal reflux disease without esophagitis: Secondary | ICD-10-CM | POA: Diagnosis not present

## 2020-01-01 DIAGNOSIS — J309 Allergic rhinitis, unspecified: Secondary | ICD-10-CM | POA: Diagnosis not present

## 2020-01-01 DIAGNOSIS — G4700 Insomnia, unspecified: Secondary | ICD-10-CM | POA: Diagnosis not present

## 2020-01-15 DIAGNOSIS — F4323 Adjustment disorder with mixed anxiety and depressed mood: Secondary | ICD-10-CM | POA: Diagnosis not present

## 2020-01-24 DIAGNOSIS — N3000 Acute cystitis without hematuria: Secondary | ICD-10-CM | POA: Diagnosis not present

## 2020-01-24 DIAGNOSIS — R8290 Unspecified abnormal findings in urine: Secondary | ICD-10-CM | POA: Diagnosis not present

## 2020-01-24 DIAGNOSIS — F4323 Adjustment disorder with mixed anxiety and depressed mood: Secondary | ICD-10-CM | POA: Diagnosis not present

## 2020-02-23 DIAGNOSIS — F4323 Adjustment disorder with mixed anxiety and depressed mood: Secondary | ICD-10-CM | POA: Diagnosis not present

## 2020-03-01 DIAGNOSIS — F4323 Adjustment disorder with mixed anxiety and depressed mood: Secondary | ICD-10-CM | POA: Diagnosis not present

## 2020-03-06 DIAGNOSIS — F4323 Adjustment disorder with mixed anxiety and depressed mood: Secondary | ICD-10-CM | POA: Diagnosis not present

## 2020-03-12 DIAGNOSIS — S52502A Unspecified fracture of the lower end of left radius, initial encounter for closed fracture: Secondary | ICD-10-CM | POA: Diagnosis not present

## 2020-03-12 DIAGNOSIS — W11XXXA Fall on and from ladder, initial encounter: Secondary | ICD-10-CM | POA: Diagnosis not present

## 2020-03-12 DIAGNOSIS — S63502A Unspecified sprain of left wrist, initial encounter: Secondary | ICD-10-CM | POA: Diagnosis not present

## 2020-03-19 DIAGNOSIS — S52502D Unspecified fracture of the lower end of left radius, subsequent encounter for closed fracture with routine healing: Secondary | ICD-10-CM | POA: Diagnosis not present

## 2020-03-22 DIAGNOSIS — F4323 Adjustment disorder with mixed anxiety and depressed mood: Secondary | ICD-10-CM | POA: Diagnosis not present

## 2020-04-09 DIAGNOSIS — S52502D Unspecified fracture of the lower end of left radius, subsequent encounter for closed fracture with routine healing: Secondary | ICD-10-CM | POA: Diagnosis not present

## 2020-04-11 DIAGNOSIS — F4323 Adjustment disorder with mixed anxiety and depressed mood: Secondary | ICD-10-CM | POA: Diagnosis not present

## 2020-04-19 DIAGNOSIS — F4323 Adjustment disorder with mixed anxiety and depressed mood: Secondary | ICD-10-CM | POA: Diagnosis not present

## 2020-04-23 DIAGNOSIS — F4323 Adjustment disorder with mixed anxiety and depressed mood: Secondary | ICD-10-CM | POA: Diagnosis not present

## 2020-04-29 NOTE — Progress Notes (Signed)
Follow-up Note: Gyn-Onc  Consult was requested by Dr. Kennon Rounds for the evaluation of Elizabeth Henry 49 y.o. female   CC:  Chief Complaint  Patient presents with  . Ovarian cancer, bilateral    Assessment/Plan:  Elizabeth Henry  is a 49 y.o.  year old with stage IV ovarian cancer (BRCA1 positive) in the setting of acute PE, s/p 3 cycles of neoadjuvant chemotherapy s/p ex lap, TAH, BSO, omentectomy, interval debulking to no macroscopic residual disease on 05/02/15. 3 additional cycles of chemotherapy with carboplatin and paclitaxel completed on 06/24/15. Enduring complete response to primary therapy. 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  BRCA 1 deleterious mutation positive. s/p risk reducing mastectomy.   We will suspend cancer surveillance care as she has met 5 year follow-up disease free. Counseled regarding symptoms of recurrence and to return to see me if these develop.    HPI: Elizabeth Henry is a very pleasant 49 year old G2P2 who was initially seen in consultation at the request of Dr Kennon Rounds for bilateral  Ovarian masses, ascites, elevated CA-125, and abnormal uterine bleeding. The patient began experiencing abdominal bloating and early satiety 6 weeks ago in July 2016. She since the end of August 2016 she has experienced vaginal bleeding every day that is light. She denies pain other than stretching pain in her abdomen. She's had some constipation but no change in the caliber of his stools and no blood in her bowel movements.  She was evaluated by Dr. Kennon Rounds on 01/25/2015. A pelvic and abdominal ultrasound scan were ordered and revealed large volume of ascites in the abdomen, bilateral ovarian masses with the right measuring 24.3 x 13.1 x 25.3 cm with solid and cystic components and blood flow within the solid components. The left ovary measured 14.5 x 12.7 x 14.8 cm and containing solid cystic components with increased blood  flow. The endometrial stripe was slightly thickened at 9 mm (cc patient is premenopausal). A CA-125 was drawn on 01/28/2015 and was elevated at 3142 units per milliliter.  A CT chest was performed on 01/31/15 which showed an acute PE and pleural effusions.  She was started on therapeutic lovenox.  She was started on neoadjuvant chemotherapy (due to her acute PE and the concern about discontinuing anticoagulation for surgery in the acute setting).  Prior to starting chemotherapy she underwent paracentesis and thoracentesis (right) both of which confirmed metastatic adencarcinoma consistent with gyn primary.  On 02/12/15 she began neoadjuvant chemotherapy with 3 cycles of paclitaxel and carboplatin.  She underwent BRCA deleterious testing which was positive for a BRCA 1 mutation.  CT on 03/29/15 showed interval resolution of ascites and significant improvement in upper abdominal disease with persistent ovarian cysts.  On 04/16/15 she underwent an ex lap, TAH, BSO, omentectomy and radical tumor debulking to no gross residual disease. She tolerated surgery well.  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  She went on to complete 3 additional adjuvant chemotherapy cycles with carboplatin and paclitaxel. She tolerated treatment well with no concerns. Last cycle of treatment was 06/24/15.  CT abdo/pelvis and chest on 07/22/15 showed no measurable residual disease. The IVC filter appeared to have migrated to the ovarian vessel.  She received a risk reducing mastectomy in September, 2017 at [facility].  CT scan (as part of trial protocol) in July, 2018 was negative for evidence of disease recurrence (stable pelvic peritoneal thickening). CT scan (  as part of trial protocol) in December, 2018 was negative for disease recurrence. She had a normal CA 125 of 6.1.  No bloating, pain, early satiety, edema, cough.   She does have low  libido which is bothersome and easy bruising but she is on aspirin.  CA 125 in December, 2020 was stable and normal at 4.1.   CT scan was ordered for mild abdominal discomfort and performed on 11/15/19 and showed no evidence of recurrence.    Interval Hx: ECOG performance status 0  CA 125 on 10/11/19 was stable and normal at 4.2.  Current Meds:  Outpatient Encounter Medications as of 05/02/2020  Medication Sig  . aspirin EC 81 MG tablet Take 81 mg by mouth daily.  . Cholecalciferol (VITAMIN D) 2000 UNITS tablet Take 4,000 Units by mouth daily.  Marland Kitchen docusate sodium (COLACE) 100 MG capsule Take 50 mg by mouth daily.   Marland Kitchen estradiol (ESTRACE) 0.5 MG tablet TAKE 2 TABLETS (1 MG TOTAL) BY MOUTH DAILY.  . hydrOXYzine (ATARAX/VISTARIL) 25 MG tablet Take 50 mg by mouth at bedtime.   Marland Kitchen LORazepam (ATIVAN) 1 MG tablet Take 2 mg by mouth at bedtime.  . Melatonin 5 MG TABS Take 5 mg by mouth.  . montelukast (SINGULAIR) 10 MG tablet Take 10 mg by mouth at bedtime.  . naproxen (NAPROSYN) 500 MG tablet TAKE 1 TABLET BY MOUTH EVERY 12 HOURS AS NEEDED FOR PAIN .TAKE WITH FOOD OR MILK  . pantoprazole (PROTONIX) 40 MG tablet Take 80 mg by mouth daily.   Marland Kitchen PREVIDENT 5000 BOOSTER PLUS 1.1 % PSTE Reported on 09/12/2015  . venlafaxine (EFFEXOR) 100 MG tablet Take 100 mg by mouth 2 (two) times daily.  Marland Kitchen triamcinolone cream (KENALOG) 0.1 % For exzema (Patient not taking: Reported on 05/01/2020)   No facility-administered encounter medications on file as of 05/02/2020.    Allergy:  Allergies  Allergen Reactions  . Ambien [Zolpidem Tartrate] Swelling  . Compazine [Prochlorperazine Edisylate] Hives  . Tylox [Oxycodone-Acetaminophen] Hives    Social Hx:   Social History   Socioeconomic History  . Marital status: Married    Spouse name: Coralyn Mark  . Number of children: 2  . Years of education: 38  . Highest education level: Not on file  Occupational History  . Occupation: Self-employed  Tobacco Use  .  Smoking status: Former Smoker    Packs/day: 1.00    Years: 10.00    Pack years: 10.00    Types: Cigarettes    Quit date: 10/18/2002    Years since quitting: 17.5  . Smokeless tobacco: Never Used  . Tobacco comment: smoked on and off for 10 yrs total - up to 1 ppd at most  Vaping Use  . Vaping Use: Never used  Substance and Sexual Activity  . Alcohol use: No    Alcohol/week: 0.0 standard drinks    Comment: very rarely  . Drug use: No  . Sexual activity: Not Currently  Other Topics Concern  . Not on file  Social History Narrative   Fun: Go to [employer]; crochet   Denies religious beliefs effecting health care.    Social Determinants of Health   Financial Resource Strain: Not on file  Food Insecurity: Not on file  Transportation Needs: Not on file  Physical Activity: Not on file  Stress: Not on file  Social Connections: Not on file  Intimate Partner Violence: Not on file    Past Surgical Hx:  Past Surgical History:  Procedure Laterality Date  .  ABDOMINAL HYSTERECTOMY Bilateral 04/16/2015   Procedure: HYSTERECTOMY ABDOMINAL TOTAL, EXPLORATORY LAPAROTOMY, BILATERAL SALPINGO OOPHERECTOMY, OMENTECTOMY, DEBULKING;  Surgeon: Everitt Amber, MD;  Location: [facility];  Service: Gynecology;  Laterality: Bilateral;  . RECONSTRUCTION BREAST IMMEDIATE / DELAYED W/ TISSUE EXPANDER    . SIMPLE MASTECTOMY    . WISDOM TOOTH EXTRACTION      Past Medical Hx:  Past Medical History:  Diagnosis Date  . Allergy   . Anxiety   . Cancer ([place])   . GERD (gastroesophageal reflux disease)   . Migraines   . Pneumonia    bronchialpneumonia circa 2330  . Pulmonary embolism [facility]) October 2016  . Strep throat     Past Gynecological History:  Remote hx of abn paps.  Patient's last menstrual period was 01/08/2015 (approximate).  Family Hx:  Family History  Problem Relation Age of Onset  . Breast cancer Mother 62  . Healthy Father   . Breast cancer Maternal Grandmother        dx. 82 or younger   . Brain cancer Paternal Grandmother        unspecified tumor type  . HIV Brother   . Leukemia Paternal Uncle   . HIV Brother   . Alzheimer's disease Maternal Grandfather   . Diabetes Maternal Grandfather   . Breast cancer Other   . Cancer Paternal Uncle        unspecified type    Review of Systems:  Constitutional  Feels distended  ENT Normal appearing ears and nares bilaterally Skin/Breast  No rash, sores, jaundice, itching, dryness Cardiovascular  No chest pain, shortness of breath, or edema  Pulmonary  No cough or wheeze.  Gastro Intestinal  No nausea, vomitting, or diarrhoea.  Genito Urinary  No frequency, urgency, dysuria, no vaginal bleeding Musculo Skeletal  No myalgia, arthralgia, joint swelling or pain  Neurologic  No weakness, numbness, change in gait,  Psychology  No depression, anxiety, insomnia.   Vitals:  Blood pressure (!) 97/59, pulse 72, temperature (!) 97 F (36.1 C), temperature source Tympanic, resp. rate 20, height _0  (1.6 m), weight 165 lb (74.8 kg), last menstrual period 01/08/2015, SpO2 98 %.  Physical Exam: WD in NAD Neck  Supple NROM, without any enlargements.  Lymph Node Survey No cervical supraclavicular or inguinal adenopathy Cardiovascular  Pulse normal rate, regularity and rhythm. S1 and S2 normal.  Lungs  Clear to auscultation bilateraly, without wheezes/crackles/rhonchi. Good air movement.  Skin  No rash/lesions/breakdown  Psychiatry  Alert and oriented to person, place, and time  Abdomen  Normoactive bowel sounds, abdomen non distended with well healed incision. No masses. Back No CVA tenderness Genito Urinary  Vulva/vagina: Normal external female genitalia.   No lesions. No discharge or bleeding.  Bladder/urethra:  No lesions or masses, well supported bladder  Vagina: smooth, in tact, no masses  Cervix and uterus: surgically absent  Adnexa: no masses. Rectal  Good tone, no masses no cul de sac nodularity.   Extremities  No bilateral cyanosis, clubbing or edema.   Thereasa Solo, MD  05/02/2020, 1:34 PM

## 2020-05-01 ENCOUNTER — Encounter: Payer: Self-pay | Admitting: Gynecologic Oncology

## 2020-05-02 ENCOUNTER — Encounter: Payer: Self-pay | Admitting: Gynecologic Oncology

## 2020-05-02 ENCOUNTER — Inpatient Hospital Stay: Payer: BC Managed Care – PPO | Attending: Gynecologic Oncology

## 2020-05-02 ENCOUNTER — Inpatient Hospital Stay (HOSPITAL_BASED_OUTPATIENT_CLINIC_OR_DEPARTMENT_OTHER): Payer: BC Managed Care – PPO | Admitting: Gynecologic Oncology

## 2020-05-02 ENCOUNTER — Other Ambulatory Visit: Payer: Self-pay

## 2020-05-02 VITALS — BP 97/59 | HR 72 | Temp 97.0°F | Resp 20 | Ht 63.0 in | Wt 165.0 lb

## 2020-05-02 DIAGNOSIS — Z90710 Acquired absence of both cervix and uterus: Secondary | ICD-10-CM | POA: Diagnosis not present

## 2020-05-02 DIAGNOSIS — Z148 Genetic carrier of other disease: Secondary | ICD-10-CM | POA: Insufficient documentation

## 2020-05-02 DIAGNOSIS — C563 Malignant neoplasm of bilateral ovaries: Secondary | ICD-10-CM

## 2020-05-02 DIAGNOSIS — Z90722 Acquired absence of ovaries, bilateral: Secondary | ICD-10-CM | POA: Diagnosis not present

## 2020-05-02 DIAGNOSIS — Z8543 Personal history of malignant neoplasm of ovary: Secondary | ICD-10-CM

## 2020-05-02 DIAGNOSIS — Z9221 Personal history of antineoplastic chemotherapy: Secondary | ICD-10-CM | POA: Diagnosis not present

## 2020-05-02 DIAGNOSIS — Z08 Encounter for follow-up examination after completed treatment for malignant neoplasm: Secondary | ICD-10-CM | POA: Insufficient documentation

## 2020-05-02 NOTE — Patient Instructions (Signed)
Dr Oliver Hum office can be contacted at [phone] with questions.  You no longer require scheduled follow-up visits for your cancer history.

## 2020-05-03 LAB — CA 125: Cancer Antigen (CA) 125: 4.6 U/mL (ref 0.0–38.1)

## 2020-05-08 ENCOUNTER — Telehealth: Payer: Self-pay

## 2020-05-08 NOTE — Telephone Encounter (Signed)
Told Ms Elizabeth Henry that ehr CA-125 was good at 4.6 as noted below by Dr. Andrey Farmer. Pt verbalized understanding.

## 2020-05-08 NOTE — Telephone Encounter (Signed)
-----   Message from Doylene Bode, NP sent at 05/06/2020  1:09 PM EST ----- Please let her know her CA 125 is stable and within normal range

## 2020-05-26 ENCOUNTER — Other Ambulatory Visit: Payer: Self-pay | Admitting: Gynecologic Oncology

## 2020-05-26 DIAGNOSIS — E2839 Other primary ovarian failure: Secondary | ICD-10-CM

## 2020-11-27 ENCOUNTER — Telehealth: Payer: Self-pay | Admitting: Oncology

## 2020-11-27 NOTE — Telephone Encounter (Signed)
GOG 0225 research follow-up patient call. On 11/08/20 I called patient and left her a message to return my call for her follow-up visit for the GOG 0225 research study.  On 11/21/20 call patient again for her follow-up visit for the study and left another message.  On 11/25/20 patient left a voice message for me that she had returned my call.  She stated that is wanted me to call her back to discuss this study with her.  I did return her call on 11/26/20 and left a message.  As of 11/27/20 patient has not returned my call back.  Remer Macho 11/27/20

## 2020-12-02 ENCOUNTER — Encounter: Payer: Self-pay | Admitting: Oncology

## 2021-06-05 ENCOUNTER — Telehealth: Payer: Self-pay | Admitting: Oncology

## 2021-06-05 NOTE — Telephone Encounter (Signed)
06/05/21 - GOG 0225 follow-up visit phone call. Called and spoke to patient on the phone. She states she is doing good.  No problems or concerns.  I thanked the patient for her continued support in this clinical trial.  I will call when her next follow-up visit is due. Remer Macho 06/05/21 - 11:25 am

## 2021-07-30 DIAGNOSIS — Z0000 Encounter for general adult medical examination without abnormal findings: Secondary | ICD-10-CM | POA: Diagnosis not present

## 2021-07-30 DIAGNOSIS — Z13220 Encounter for screening for lipoid disorders: Secondary | ICD-10-CM | POA: Diagnosis not present

## 2021-07-30 DIAGNOSIS — Z8543 Personal history of malignant neoplasm of ovary: Secondary | ICD-10-CM | POA: Diagnosis not present

## 2021-07-30 DIAGNOSIS — E559 Vitamin D deficiency, unspecified: Secondary | ICD-10-CM | POA: Diagnosis not present

## 2022-02-06 DIAGNOSIS — Z1211 Encounter for screening for malignant neoplasm of colon: Secondary | ICD-10-CM | POA: Diagnosis not present

## 2022-05-05 ENCOUNTER — Telehealth: Payer: Self-pay | Admitting: Oncology

## 2022-05-05 NOTE — Telephone Encounter (Signed)
05/05/2022 - GOG 0225 follow-up visit.  Called and spoke to the patient over the phone.  She states she is doing good.  No problems or concerns at this time. I thanked the patient for her continue support in this clinical trial and that I would call her again in 6 months. Remer Macho 05/05/22 - 2:35 pm

## 2022-08-04 DIAGNOSIS — Z8543 Personal history of malignant neoplasm of ovary: Secondary | ICD-10-CM | POA: Diagnosis not present

## 2022-08-04 DIAGNOSIS — E7800 Pure hypercholesterolemia, unspecified: Secondary | ICD-10-CM | POA: Diagnosis not present

## 2022-08-04 DIAGNOSIS — Z0000 Encounter for general adult medical examination without abnormal findings: Secondary | ICD-10-CM | POA: Diagnosis not present

## 2022-08-04 DIAGNOSIS — E559 Vitamin D deficiency, unspecified: Secondary | ICD-10-CM | POA: Diagnosis not present

## 2022-10-20 DIAGNOSIS — K649 Unspecified hemorrhoids: Secondary | ICD-10-CM | POA: Diagnosis not present

## 2022-10-20 DIAGNOSIS — K219 Gastro-esophageal reflux disease without esophagitis: Secondary | ICD-10-CM | POA: Diagnosis not present

## 2022-11-06 DIAGNOSIS — S52502D Unspecified fracture of the lower end of left radius, subsequent encounter for closed fracture with routine healing: Secondary | ICD-10-CM | POA: Diagnosis not present

## 2022-11-16 DIAGNOSIS — S63502A Unspecified sprain of left wrist, initial encounter: Secondary | ICD-10-CM | POA: Diagnosis not present

## 2023-08-10 DIAGNOSIS — K219 Gastro-esophageal reflux disease without esophagitis: Secondary | ICD-10-CM | POA: Diagnosis not present

## 2023-08-10 DIAGNOSIS — Z0000 Encounter for general adult medical examination without abnormal findings: Secondary | ICD-10-CM | POA: Diagnosis not present

## 2023-08-10 DIAGNOSIS — F419 Anxiety disorder, unspecified: Secondary | ICD-10-CM | POA: Diagnosis not present

## 2023-08-10 DIAGNOSIS — E7800 Pure hypercholesterolemia, unspecified: Secondary | ICD-10-CM | POA: Diagnosis not present

## 2023-08-10 DIAGNOSIS — E559 Vitamin D deficiency, unspecified: Secondary | ICD-10-CM | POA: Diagnosis not present

## 2023-08-10 DIAGNOSIS — G43009 Migraine without aura, not intractable, without status migrainosus: Secondary | ICD-10-CM | POA: Diagnosis not present

## 2023-12-14 DIAGNOSIS — N390 Urinary tract infection, site not specified: Secondary | ICD-10-CM | POA: Diagnosis not present
